# Patient Record
Sex: Female | Born: 1940 | Race: White | Hispanic: No | Smoking: Never smoker
Health system: Southern US, Community
[De-identification: ages and names within clinical notes are randomized; demographics above are authoritative.]

## PROBLEM LIST (undated history)

## (undated) DIAGNOSIS — L508 Other urticaria: Secondary | ICD-10-CM

## (undated) DIAGNOSIS — E785 Hyperlipidemia, unspecified: Secondary | ICD-10-CM

## (undated) DIAGNOSIS — E039 Hypothyroidism, unspecified: Secondary | ICD-10-CM

## (undated) DIAGNOSIS — I5032 Chronic diastolic (congestive) heart failure: Secondary | ICD-10-CM

## (undated) DIAGNOSIS — K219 Gastro-esophageal reflux disease without esophagitis: Secondary | ICD-10-CM

## (undated) DIAGNOSIS — E663 Overweight: Secondary | ICD-10-CM

## (undated) DIAGNOSIS — K8020 Calculus of gallbladder without cholecystitis without obstruction: Secondary | ICD-10-CM

## (undated) DIAGNOSIS — I2510 Atherosclerotic heart disease of native coronary artery without angina pectoris: Secondary | ICD-10-CM

## (undated) DIAGNOSIS — I10 Essential (primary) hypertension: Secondary | ICD-10-CM

## (undated) HISTORY — DX: Hyperlipidemia, unspecified: E78.5

## (undated) HISTORY — DX: Hypothyroidism, unspecified: E03.9

## (undated) HISTORY — DX: Chronic diastolic (congestive) heart failure: I50.32

## (undated) HISTORY — DX: Essential (primary) hypertension: I10

## (undated) HISTORY — PX: TUBAL LIGATION: SHX77

## (undated) HISTORY — PX: CHOLECYSTECTOMY: SHX55

## (undated) HISTORY — DX: Other urticaria: L50.8

## (undated) HISTORY — DX: Atherosclerotic heart disease of native coronary artery without angina pectoris: I25.10

## (undated) HISTORY — DX: Overweight: E66.3

## (undated) HISTORY — DX: Gastro-esophageal reflux disease without esophagitis: K21.9

## (undated) HISTORY — DX: Calculus of gallbladder without cholecystitis without obstruction: K80.20

## (undated) NOTE — *Deleted (*Deleted)
Name: Vickie [name] MRN: [id number] DOB: December 07, 1940 61 y.o. PCP: Inez Catalina, MD  Date of Admission: 06/21/2020 10:17 PM Date of Discharge:  Attending Physician: Inez Catalina, MD  Discharge Diagnosis: 1. ***  Discharge Medications: Allergies as of 06/23/2020      Reactions   Atorvastatin Other (See Comments)   Elevated liver functions numbers   Rosuvastatin Other (See Comments)   Constipation      Medication List    TAKE these medications   aspirin 81 MG tablet Take 1 tablet (81 mg total) by mouth daily.   dexamethasone 6 MG tablet Commonly known as: DECADRON Take 1 tablet (6 mg total) by mouth daily for 3 days. Start taking on: June 24, 2020   ibuprofen 200 MG tablet Commonly known as: ADVIL Take 200-400 mg by mouth every 6 (six) hours as needed for mild pain (or discomfort).   isosorbide mononitrate 60 MG 24 hr tablet Commonly known as: IMDUR Take 1 tablet (60 mg total) by mouth daily.   levothyroxine 88 MCG tablet Commonly known as: SYNTHROID Take 1 tablet (88 mcg total) by mouth daily before breakfast.   lisinopril 10 MG tablet Commonly known as: ZESTRIL Take 1 tablet (10 mg total) by mouth daily.   nebivolol 10 MG tablet Commonly known as: Bystolic Take 1 tablet (10 mg total) by mouth daily. KE   nitroGLYCERIN 0.4 MG SL tablet Commonly known as: NITROSTAT Place 1 tablet (0.4 mg total) under the tongue every 5 (five) minutes as needed for chest pain.   Praluent 75 MG/ML Soaj Generic drug: Alirocumab Inject 75 mg into the skin every 14 (fourteen) days.            Durable Medical Equipment  (From admission, onward)         Start     Ordered   06/23/20 0000  For home use only DME oxygen       Question Answer Comment  Length of Need 6 Months   Mode or (Route) Nasal cannula   Liters per Minute 3   Frequency Continuous (stationary and portable oxygen unit needed)   Oxygen conserving device Yes   Oxygen delivery system Gas       06/23/20 1657           Discharge Care Instructions  (From admission, onward)         Start     Ordered   06/23/20 0000  Discharge wound care:       Comments: For your pressure injury on your right buttocks, continue frequent position changes to prevent further pressure injury to this area.   06/23/20 1657          Disposition and follow-up:   Vickie [name] was discharged from [facility] in {DISCHARGE CONDITION:19696} condition.  At the hospital follow up visit please address:  1.  ***  2.  Labs / imaging needed at time of follow-up: ***  3.  Pending labs/ test needing follow-up: ***  Follow-up Appointments:   Hospital Course by problem list: 1. ***  Discharge Vitals:   BP 134/63 (BP Location: Right Arm)   Pulse 72   Temp 97.8 F (36.6 C) (Oral)   Resp 19   Ht 5' (1.524 m)   Wt 67 kg   LMP 10/20/1991   SpO2 94%   BMI 28.85 kg/m   Pertinent Labs, Studies, and Procedures:  ***  Discharge Instructions: Discharge Instructions    Call MD for:  difficulty breathing, headache or visual disturbances   Complete by: As directed    Call MD for:  persistant dizziness or light-headedness   Complete by: As directed    Call MD for:  temperature >100.4   Complete by: As directed    Discharge wound care:   Complete by: As directed    For your pressure injury on your right buttocks, continue frequent position changes to prevent further pressure injury to this area.   For home use only DME oxygen   Complete by: As directed    Length of Need: 6 Months   Mode or (Route): Nasal cannula   Liters per Minute: 3   Frequency: Continuous (stationary and portable oxygen unit needed)   Oxygen conserving device: Yes   Oxygen delivery system: Gas   Increase activity slowly   Complete by: As directed       Signed: Alphonzo Severance, MD 06/23/2020, 4:59 PM   Pager: [phone]

---

## 2000-01-19 ENCOUNTER — Encounter: Payer: Self-pay | Admitting: Emergency Medicine

## 2000-01-19 ENCOUNTER — Emergency Department (HOSPITAL_COMMUNITY): Admission: EM | Admit: 2000-01-19 | Discharge: 2000-01-19 | Payer: Self-pay | Admitting: Emergency Medicine

## 2000-01-29 ENCOUNTER — Emergency Department (HOSPITAL_COMMUNITY): Admission: EM | Admit: 2000-01-29 | Discharge: 2000-01-29 | Payer: Self-pay | Admitting: Emergency Medicine

## 2000-02-01 ENCOUNTER — Encounter: Admission: RE | Admit: 2000-02-01 | Discharge: 2000-02-01 | Payer: Self-pay | Admitting: *Deleted

## 2000-03-12 ENCOUNTER — Encounter: Admission: RE | Admit: 2000-03-12 | Discharge: 2000-03-12 | Payer: Self-pay | Admitting: Hematology and Oncology

## 2000-06-25 ENCOUNTER — Encounter: Admission: RE | Admit: 2000-06-25 | Discharge: 2000-06-25 | Payer: Self-pay | Admitting: Internal Medicine

## 2000-08-22 ENCOUNTER — Encounter: Admission: RE | Admit: 2000-08-22 | Discharge: 2000-08-22 | Payer: Self-pay | Admitting: Internal Medicine

## 2001-09-19 ENCOUNTER — Encounter: Admission: RE | Admit: 2001-09-19 | Discharge: 2001-09-19 | Payer: Self-pay

## 2001-10-28 ENCOUNTER — Encounter: Admission: RE | Admit: 2001-10-28 | Discharge: 2001-10-28 | Payer: Self-pay | Admitting: Internal Medicine

## 2002-11-30 ENCOUNTER — Encounter: Admission: RE | Admit: 2002-11-30 | Discharge: 2002-11-30 | Payer: Self-pay | Admitting: Internal Medicine

## 2004-01-04 ENCOUNTER — Encounter: Admission: RE | Admit: 2004-01-04 | Discharge: 2004-01-04 | Payer: Self-pay | Admitting: Internal Medicine

## 2005-03-23 ENCOUNTER — Ambulatory Visit: Payer: Self-pay | Admitting: Internal Medicine

## 2006-04-01 ENCOUNTER — Ambulatory Visit: Payer: Self-pay | Admitting: Internal Medicine

## 2006-08-14 DIAGNOSIS — I10 Essential (primary) hypertension: Secondary | ICD-10-CM

## 2006-08-14 DIAGNOSIS — D649 Anemia, unspecified: Secondary | ICD-10-CM | POA: Insufficient documentation

## 2006-08-14 DIAGNOSIS — E039 Hypothyroidism, unspecified: Secondary | ICD-10-CM | POA: Insufficient documentation

## 2006-08-14 HISTORY — DX: Hypothyroidism, unspecified: E03.9

## 2006-08-14 HISTORY — DX: Essential (primary) hypertension: I10

## 2007-05-30 ENCOUNTER — Ambulatory Visit: Payer: Self-pay | Admitting: Hospitalist

## 2007-05-30 ENCOUNTER — Encounter (INDEPENDENT_AMBULATORY_CARE_PROVIDER_SITE_OTHER): Payer: Self-pay | Admitting: Internal Medicine

## 2007-06-02 LAB — CONVERTED CEMR LAB
ALT: 27 units/L (ref 0–35)
AST: 22 units/L (ref 0–37)
Albumin: 4.5 g/dL (ref 3.5–5.2)
Alkaline Phosphatase: 70 units/L (ref 39–117)
BUN: 16 mg/dL (ref 6–23)
Basophils Absolute: 0.1 10*3/uL (ref 0.0–0.1)
Basophils Relative: 1 % (ref 0–1)
CO2: 24 meq/L (ref 19–32)
Calcium: 9 mg/dL (ref 8.4–10.5)
Chloride: 106 meq/L (ref 96–112)
Cholesterol: 205 mg/dL — ABNORMAL HIGH (ref 0–200)
Creatinine, Ser: 0.95 mg/dL (ref 0.40–1.20)
Eosinophils Absolute: 0.3 10*3/uL (ref 0.0–0.7)
Eosinophils Relative: 3 % (ref 0–5)
Glucose, Bld: 94 mg/dL (ref 70–99)
HCT: 45 % (ref 36.0–46.0)
HDL: 34 mg/dL — ABNORMAL LOW (ref >39)
Hemoglobin: 14.6 g/dL (ref 12.0–15.0)
LDL Cholesterol: 132 mg/dL — ABNORMAL HIGH (ref 0–99)
Lymphocytes Relative: 45 % (ref 12–46)
Lymphs Abs: 4.1 10*3/uL — ABNORMAL HIGH (ref 0.7–3.3)
MCHC: 32.4 g/dL (ref 30.0–36.0)
MCV: 93.8 fL (ref 78.0–100.0)
Monocytes Absolute: 0.5 10*3/uL (ref 0.2–0.7)
Monocytes Relative: 6 % (ref 3–11)
Neutro Abs: 4.1 10*3/uL (ref 1.7–7.7)
Neutrophils Relative %: 46 % (ref 43–77)
Platelets: 361 10*3/uL (ref 150–400)
Potassium: 4.4 meq/L (ref 3.5–5.3)
RBC: 4.8 M/uL (ref 3.87–5.11)
RDW: 13.3 % (ref 11.5–14.0)
Sodium: 142 meq/L (ref 135–145)
TSH: 2.704 microintl units/mL (ref 0.350–5.50)
Total Bilirubin: 0.3 mg/dL (ref 0.3–1.2)
Total CHOL/HDL Ratio: 6
Total Protein: 7.4 g/dL (ref 6.0–8.3)
Triglycerides: 196 mg/dL — ABNORMAL HIGH (ref <150)
VLDL: 39 mg/dL (ref 0–40)
WBC: 9 10*3/uL (ref 4.0–10.5)

## 2007-06-12 ENCOUNTER — Encounter (INDEPENDENT_AMBULATORY_CARE_PROVIDER_SITE_OTHER): Payer: Self-pay | Admitting: Internal Medicine

## 2007-06-12 ENCOUNTER — Ambulatory Visit: Payer: Self-pay | Admitting: Hospitalist

## 2007-06-12 LAB — CONVERTED CEMR LAB
BUN: 23 mg/dL (ref 6–23)
CO2: 25 meq/L (ref 19–32)
Calcium: 9.7 mg/dL (ref 8.4–10.5)
Chloride: 102 meq/L (ref 96–112)
Creatinine, Ser: 1 mg/dL (ref 0.40–1.20)
Glucose, Bld: 99 mg/dL (ref 70–99)
Potassium: 4.3 meq/L (ref 3.5–5.3)
Sodium: 141 meq/L (ref 135–145)

## 2008-01-29 ENCOUNTER — Telehealth (INDEPENDENT_AMBULATORY_CARE_PROVIDER_SITE_OTHER): Payer: Self-pay | Admitting: Internal Medicine

## 2008-03-11 ENCOUNTER — Ambulatory Visit: Payer: Self-pay | Admitting: Infectious Diseases

## 2008-03-11 ENCOUNTER — Encounter (INDEPENDENT_AMBULATORY_CARE_PROVIDER_SITE_OTHER): Payer: Self-pay | Admitting: Internal Medicine

## 2008-03-12 LAB — CONVERTED CEMR LAB
ALT: 21 units/L (ref 0–35)
AST: 24 units/L (ref 0–37)
Albumin: 4.7 g/dL (ref 3.5–5.2)
Alkaline Phosphatase: 65 units/L (ref 39–117)
BUN: 17 mg/dL (ref 6–23)
Basophils Absolute: 0.1 10*3/uL (ref 0.0–0.1)
Basophils Relative: 1 % (ref 0–1)
CO2: 22 meq/L (ref 19–32)
Calcium: 9.8 mg/dL (ref 8.4–10.5)
Chloride: 102 meq/L (ref 96–112)
Cholesterol: 184 mg/dL (ref 0–200)
Creatinine, Ser: 0.75 mg/dL (ref 0.40–1.20)
Eosinophils Absolute: 0.4 10*3/uL (ref 0.0–0.7)
Eosinophils Relative: 5 % (ref 0–5)
Free T4: 1.45 ng/dL (ref 0.89–1.80)
Glucose, Bld: 92 mg/dL (ref 70–99)
HCT: 42.2 % (ref 36.0–46.0)
HDL: 35 mg/dL — ABNORMAL LOW (ref >39)
Hemoglobin: 14 g/dL (ref 12.0–15.0)
LDL Cholesterol: 114 mg/dL — ABNORMAL HIGH (ref 0–99)
Lymphocytes Relative: 43 % (ref 12–46)
Lymphs Abs: 3.7 10*3/uL (ref 0.7–4.0)
MCHC: 33.2 g/dL (ref 30.0–36.0)
MCV: 91.5 fL (ref 78.0–100.0)
Monocytes Absolute: 0.5 10*3/uL (ref 0.1–1.0)
Monocytes Relative: 6 % (ref 3–12)
Neutro Abs: 4 10*3/uL (ref 1.7–7.7)
Neutrophils Relative %: 46 % (ref 43–77)
Platelets: 352 10*3/uL (ref 150–400)
Potassium: 4.5 meq/L (ref 3.5–5.3)
RBC: 4.61 M/uL (ref 3.87–5.11)
RDW: 13.2 % (ref 11.5–15.5)
Sodium: 136 meq/L (ref 135–145)
TSH: 0.681 microintl units/mL (ref 0.350–4.50)
Total Bilirubin: 0.5 mg/dL (ref 0.3–1.2)
Total CHOL/HDL Ratio: 5.3
Total Protein: 7.6 g/dL (ref 6.0–8.3)
Triglycerides: 174 mg/dL — ABNORMAL HIGH (ref <150)
VLDL: 35 mg/dL (ref 0–40)
WBC: 8.8 10*3/uL (ref 4.0–10.5)

## 2008-03-18 ENCOUNTER — Telehealth (INDEPENDENT_AMBULATORY_CARE_PROVIDER_SITE_OTHER): Payer: Self-pay | Admitting: Internal Medicine

## 2008-11-01 ENCOUNTER — Ambulatory Visit: Payer: Self-pay | Admitting: Internal Medicine

## 2008-11-01 LAB — CONVERTED CEMR LAB
BUN: 19 mg/dL (ref 6–23)
CO2: 22 meq/L (ref 19–32)
Calcium: 9.2 mg/dL (ref 8.4–10.5)
Chloride: 101 meq/L (ref 96–112)
Cholesterol: 203 mg/dL — ABNORMAL HIGH (ref 0–200)
Creatinine, Ser: 1 mg/dL (ref 0.40–1.20)
Glucose, Bld: 92 mg/dL (ref 70–99)
HDL: 36 mg/dL — ABNORMAL LOW (ref >39)
LDL Cholesterol: 120 mg/dL — ABNORMAL HIGH (ref 0–99)
Potassium: 4.5 meq/L (ref 3.5–5.3)
Sodium: 137 meq/L (ref 135–145)
TSH: 1.613 microintl units/mL (ref 0.350–4.500)
Total CHOL/HDL Ratio: 5.6
Triglycerides: 234 mg/dL — ABNORMAL HIGH (ref <150)
VLDL: 47 mg/dL — ABNORMAL HIGH (ref 0–40)

## 2009-05-31 ENCOUNTER — Telehealth: Payer: Self-pay | Admitting: Internal Medicine

## 2010-01-20 ENCOUNTER — Telehealth: Payer: Self-pay | Admitting: Internal Medicine

## 2010-02-15 ENCOUNTER — Ambulatory Visit: Payer: Self-pay | Admitting: Internal Medicine

## 2010-02-15 LAB — HM MAMMOGRAPHY

## 2010-02-15 LAB — HM PAP SMEAR

## 2010-09-20 ENCOUNTER — Ambulatory Visit: Admission: RE | Admit: 2010-09-20 | Discharge: 2010-09-20 | Payer: Self-pay | Source: Home / Self Care

## 2010-09-20 ENCOUNTER — Encounter: Payer: Self-pay | Admitting: Internal Medicine

## 2010-09-20 LAB — CONVERTED CEMR LAB
ALT: 19 units/L (ref 0–35)
AST: 17 units/L (ref 0–37)
Albumin: 4.6 g/dL (ref 3.5–5.2)
Alkaline Phosphatase: 70 units/L (ref 39–117)
BUN: 10 mg/dL (ref 6–23)
Basophils Absolute: 0.1 10*3/uL (ref 0.0–0.1)
Basophils Relative: 1 % (ref 0–1)
CO2: 25 meq/L (ref 19–32)
Calcium: 9.5 mg/dL (ref 8.4–10.5)
Chloride: 104 meq/L (ref 96–112)
Creatinine, Ser: 0.85 mg/dL (ref 0.40–1.20)
Eosinophils Absolute: 0.1 10*3/uL (ref 0.0–0.7)
Eosinophils Relative: 2 % (ref 0–5)
Glucose, Bld: 95 mg/dL (ref 70–99)
HCT: 45.9 % (ref 36.0–46.0)
Hemoglobin: 14.7 g/dL (ref 12.0–15.0)
Lymphocytes Relative: 42 % (ref 12–46)
Lymphs Abs: 3.4 10*3/uL (ref 0.7–4.0)
MCHC: 32 g/dL (ref 30.0–36.0)
MCV: 92.7 fL (ref 78.0–100.0)
Monocytes Absolute: 0.5 10*3/uL (ref 0.1–1.0)
Monocytes Relative: 7 % (ref 3–12)
Neutro Abs: 3.9 10*3/uL (ref 1.7–7.7)
Neutrophils Relative %: 49 % (ref 43–77)
Platelets: 357 10*3/uL (ref 150–400)
Potassium: 4.6 meq/L (ref 3.5–5.3)
RBC: 4.95 M/uL (ref 3.87–5.11)
RDW: 13.2 % (ref 11.5–15.5)
Sodium: 140 meq/L (ref 135–145)
TSH: 1.688 microintl units/mL (ref 0.350–4.50)
Total Bilirubin: 0.4 mg/dL (ref 0.3–1.2)
Total Protein: 7.2 g/dL (ref 6.0–8.3)
WBC: 7.9 10*3/uL (ref 4.0–10.5)

## 2010-09-26 NOTE — Progress Notes (Signed)
Summary: med refill/gp  Phone Note Refill Request Message from:  Patient on Jan 20, 2010 11:08 AM  Refills Requested: Medication #1:  SYNTHROID 125 MCG TABS Take 1 tablet by mouth once a day  Medication #2:  LISINOPRIL-HYDROCHLOROTHIAZIDE 20-25 MG  TABS Take 1 tablet by mouth once a day  Medication #3:  ATENOLOL 100 MG TABS Take 1 tablet by mouth once a day  Medication #4:  ASPIRIN 81 MG  TBEC Take 1 tablet by mouth once a day. Pt. is completely out;  she has an appt. June 22,2011.     Method Requested: Electronic Initial call taken by: Chinita Pester RN,  Jan 20, 2010 11:08 AM  Follow-up for Phone Call        completed refill, thank you Pratik Dalziel  Follow-up by: Mliss Sax MD,  Jan 20, 2010 1:40 PM    Prescriptions: ASPIRIN 81 MG  TBEC (ASPIRIN) Take 1 tablet by mouth once a day  #30 x 5   Entered and Authorized by:   Mliss Sax MD   Signed by:   Mliss Sax MD on 01/20/2010   Method used:   Electronically to        [facility]* (retail)       [address] [place], [place]  [address]       Ph: [phone]       Fax: [phone]   RxID:   [id number] ATENOLOL 100 MG TABS (ATENOLOL) Take 1 tablet by mouth once a day  #30 x 5   Entered and Authorized by:   Mliss Sax MD   Signed by:   Mliss Sax MD on 01/20/2010   Method used:   Electronically to        [facility]* (retail)       [address] [place], [place]  [address]       Ph: [phone]       Fax: [phone]   RxID:   [id number] LISINOPRIL-HYDROCHLOROTHIAZIDE 20-25 MG  TABS (LISINOPRIL-HYDROCHLOROTHIAZIDE) Take 1 tablet by mouth once a day  #30 x 5   Entered and Authorized by:   Mliss Sax MD   Signed by:   Mliss Sax MD on 01/20/2010   Method used:   Electronically to        [facility]* (retail)       [address] [place], [place]  [address]       Ph: [phone]       Fax:  [phone]   RxID:   [id number] SYNTHROID 125 MCG TABS (LEVOTHYROXINE SODIUM) Take 1 tablet by mouth once a day  #30 x 5   Entered and Authorized by:   Mliss Sax MD   Signed by:   Mliss Sax MD on 01/20/2010   Method used:   Electronically to        [facility]* (retail)       [address] [place], [place]  [address]       Ph: [phone]       Fax: [phone]   RxID:   [id number]   Appended Document: med refill/gp Pt was called and made awared Rx refills.

## 2010-09-26 NOTE — Assessment & Plan Note (Signed)
Summary: EST-CK/FU/MEDS/CFB   Vital Signs:  Patient profile:   70 year old female Height:      61 inches (154.94 cm) Weight:      164.8 pounds (74.91 kg) BMI:     31.25 Temp:     98.4 degrees F (36.89 degrees C) oral Pulse rate:   86 / minute BP sitting:   130 / 84  (right arm)  Vitals Entered By: Stanton Kidney Ditzler RN (February 15, 2010 2:51 PM) Is Patient Diabetic? No Pain Assessment Patient in pain? no      Nutritional Status BMI of > 30 = obese Nutritional Status Detail appetite good  Have you ever been in a relationship where you felt threatened, hurt or afraid?denies   Does patient need assistance? Functional Status Self care Ambulation Normal Comments Refills on meds.   History of Present Illness: 70 yo female with PMH outlined below presents to [facility] for regular follow up appointment. She has no concerns at the time. No recent sicknesses or hospitalizaitons. No episodes of chest pain, SOB, palpitations. No specific abdominal or urinary concerns. No recent changes in appetite, weight, sleep patterns, mood.    Depression History:      The patient denies a depressed mood most of the day and a diminished interest in her usual daily activities.  The patient denies significant weight loss, significant weight gain, insomnia, hypersomnia, psychomotor agitation, psychomotor retardation, fatigue (loss of energy), feelings of worthlessness (guilt), impaired concentration (indecisiveness), and recurrent thoughts of death or suicide.        The patient denies that she feels like life is not worth living, denies that she wishes that she were dead, and denies that she has thought about ending her life.         Preventive Screening-Counseling & Management  Alcohol-Tobacco     Alcohol type: occasional     Smoking Status: never  Caffeine-Diet-Exercise     Does Patient Exercise: yes     Type of exercise: yard work  Problems Prior to Update: 1)  Hypothyroidism  (ICD-244.9) 2)   Hypertension  (ICD-401.9) 3)  Anemia-nos  (ICD-285.9)  Medications Prior to Update: 1)  Synthroid 125 Mcg Tabs (Levothyroxine Sodium) .... Take 1 Tablet By Mouth Once A Day 2)  Lisinopril-Hydrochlorothiazide 20-25 Mg  Tabs (Lisinopril-Hydrochlorothiazide) .... Take 1 Tablet By Mouth Once A Day 3)  Atenolol 100 Mg Tabs (Atenolol) .... Take 1 Tablet By Mouth Once A Day 4)  Aspirin 81 Mg  Tbec (Aspirin) .... Take 1 Tablet By Mouth Once A Day  Current Medications (verified): 1)  Synthroid 125 Mcg Tabs (Levothyroxine Sodium) .... Take 1 Tablet By Mouth Once A Day 2)  Lisinopril-Hydrochlorothiazide 20-25 Mg  Tabs (Lisinopril-Hydrochlorothiazide) .... Take 1 Tablet By Mouth Once A Day 3)  Atenolol 100 Mg Tabs (Atenolol) .... Take 1 Tablet By Mouth Once A Day 4)  Aspirin 81 Mg  Tbec (Aspirin) .... Take 1 Tablet By Mouth Once A Day  Allergies (verified): No Known Drug Allergies  Past History:  Past Medical History: Last updated: 11/01/2008 Anemia-NOS, hx of when a teenager Hypertension Hypothyroidism Refusal of cancer screening (no mammogram, pap smear, or colonoscopy)  Past Surgical History: Last updated: 03/11/2008 Cholecystectomy Tubal ligation  Family History: Last updated: 03/11/2008 M 80 unknown medical problems, hx of lymphoma F died in war 4 Sibs 1 sis with lung CA Children 3 sons A&W, 1 with bladder CA  Social History: Last updated: 11/01/2008 Retired Divorced Never Smoked Drug use-no Regular exercise-yes-  works in the garden/yard Occasional ETOH, very infrequently (less than once a month)  Risk Factors: Exercise: yes (02/15/2010)  Risk Factors: Smoking Status: never (02/15/2010)  Family History: Reviewed history from 03/11/2008 and no changes required. M 80 unknown medical problems, hx of lymphoma F died in war 4 Sibs 1 sis with lung CA Children 3 sons A&W, 1 with bladder CA  Social History: Reviewed history from 11/01/2008 and no changes  required. Retired Divorced Never Smoked Drug use-no Regular exercise-yes- works in the garden/yard Occasional ETOH, very infrequently (less than once a month)  Review of Systems       per HPI  Physical Exam  General:  Well-developed,well-nourished,in no acute distress; alert,appropriate and cooperative throughout examination Lungs:  normal respiratory effort, no intercostal retractions, no accessory muscle use, normal breath sounds, no crackles, and no wheezes.   Heart:  normal rate, regular rhythm, no murmur, no gallop, and no rub.   Abdomen:  Bowel sounds positive,abdomen soft and non-tender without masses, organomegaly or hernias noted. Neurologic:  alert & oriented X3.   Psych:  Appropriate to somewhat withdrawn.   Impression & Recommendations:  Problem # 1:  HYPERTENSION (ICD-401.9) Assessment Unchanged Pt tells me that she does not like taking atenolol and she is ready to quit taking it. We have discussed in detail what is the possible outcome of this and I was willing to offer another substitute medicine and she is refusing. This is apparnetly a chronic issue with her and I am willing to compromise. I have discussed with her diet changes and exercises that she will have to follow if we are to stop atenolol. We can see how she does and will see her back on 3 months and see if lisinopril-HCTZ is adequate in BP control.  The following medications were removed from the medication list:    Atenolol 100 Mg Tabs (Atenolol) .Marland Kitchen... Take 1 tablet by mouth once a day Her updated medication list for this problem includes:    Lisinopril-hydrochlorothiazide 20-25 Mg Tabs (Lisinopril-hydrochlorothiazide) .Marland Kitchen... Take 1 tablet by mouth once a day  BP today: 130/84 Prior BP: 143/80 (11/01/2008)  Labs Reviewed: K+: 4.5 (11/01/2008) Creat: : 1.00 (11/01/2008)   Chol: 203 (11/01/2008)   HDL: 36 (11/01/2008)   LDL: 120 (11/01/2008)   TG: 234 (11/01/2008)  Problem # 2:  HYPOTHYROIDISM  (ICD-244.9) Will reassess TSH on her next visit and can probably readjust the medicine as indicated.  Her updated medication list for this problem includes:    Synthroid 125 Mcg Tabs (Levothyroxine sodium) .Marland Kitchen... Take 1 tablet by mouth once a day  Labs Reviewed: TSH: 1.613 (11/01/2008)    Chol: 203 (11/01/2008)   HDL: 36 (11/01/2008)   LDL: 120 (11/01/2008)   TG: 234 (11/01/2008)  Complete Medication List: 1)  Synthroid 125 Mcg Tabs (Levothyroxine sodium) .... Take 1 tablet by mouth once a day 2)  Lisinopril-hydrochlorothiazide 20-25 Mg Tabs (Lisinopril-hydrochlorothiazide) .... Take 1 tablet by mouth once a day 3)  Aspirin 81 Mg Tbec (Aspirin) .... Take 1 tablet by mouth once a day  Patient Instructions: 1)  Please schedule a follow-up appointment in 3 months. 2)  Please check your blood pressure regularly, if it is >170 please call clinic at [phone]   Prevention & Chronic Care Immunizations   Influenza vaccine: Not documented   Influenza vaccine deferral: Not indicated  (02/15/2010)    Tetanus booster: Not documented   Td booster deferral: Not indicated  (02/15/2010)    Pneumococcal vaccine: Not documented  Pneumococcal vaccine deferral: Not indicated  (02/15/2010)    H. zoster vaccine: Not documented   H. zoster vaccine deferral: Not indicated  (02/15/2010)  Colorectal Screening   Hemoccult: Not documented   Hemoccult action/deferral: Not indicated  (02/15/2010)    Colonoscopy: Not documented   Colonoscopy action/deferral: Not indicated  (02/15/2010)  Other Screening   Pap smear: Not documented   Pap smear action/deferral: Refused  (02/15/2010)    Mammogram: Not documented   Mammogram action/deferral: Refused  (02/15/2010)    DXA bone density scan: Not documented   DXA bone density action/deferral: Refused  (02/15/2010)  Reports requested:   Last mammogram report requested.  Smoking status: never  (02/15/2010)  Lipids   Total Cholesterol: 203   (11/01/2008)   LDL: 120  (11/01/2008)   LDL Direct: Not documented   HDL: 36  (11/01/2008)   Triglycerides: 234  (11/01/2008)  Hypertension   Last Blood Pressure: 130 / 84  (02/15/2010)   Serum creatinine: 1.00  (11/01/2008)   Serum potassium 4.5  (11/01/2008)    Hypertension flowsheet reviewed?: Yes   Progress toward BP goal: At goal  Self-Management Support :   Personal Goals (by the next clinic visit) :      Personal blood pressure goal: 140/90  (02/15/2010)   Patient will work on the following items until the next clinic visit to reach self-care goals:     Medications and monitoring: take my medicines every day, bring all of my medications to every visit  (02/15/2010)     Eating: drink diet soda or water instead of juice or soda, eat foods that are low in salt, eat baked foods instead of fried foods, eat fruit for snacks and desserts  (02/15/2010)    Hypertension self-management support: Written self-care plan, Education handout, Resources for patients handout  (02/15/2010)   Hypertension self-care plan printed.   Hypertension education handout printed      Resource handout printed.   Nursing Instructions: Request report of last mammogram

## 2010-09-28 NOTE — Assessment & Plan Note (Signed)
Summary: NEED MEDICATION/SB.   Vital Signs:  Patient profile:   70 year old female Height:      61 inches (154.94 cm) Weight:      154.2 pounds (74.91 kg) BMI:     31.25 Temp:     97.7 degrees F (36.50 degrees C) oral Pulse rate:   881 / minute BP sitting:   171 / 88  (right arm) Cuff size:   regular  Vitals Entered By: Theotis Barrio NT II (September 20, 2010 9:39 AM) CC: ROUTINE OFFICE VISIT FOR MEDICATION REFILL /  Is Patient Diabetic? No Pain Assessment Patient in pain? no      Nutritional Status BMI of > 30 = obese  Have you ever been in a relationship where you felt threatened, hurt or afraid?No   Does patient need assistance? Functional Status Self care Ambulation Normal   CC:  ROUTINE OFFICE VISIT FOR MEDICATION REFILL / .  History of Present Illness: Pt is a 70 y/o F with PMH outlined in the EMR who presents for routine visit and med refill.  Last seen in clinic in 2010.  HTN: pt tolerating lisinopril-hctz well but reports less than 100% compliance in the past. She has tired to improve this over the past month and is taking her pill every day.  Hypothyroid: doing well with synthroid.  She denies hair loss, unintentional weight loss, weight gain, increased anxiety or other compliance.    Preventive Screening-Counseling & Management  Alcohol-Tobacco     Alcohol type: occasional     Smoking Status: never  Caffeine-Diet-Exercise     Does Patient Exercise: yes     Type of exercise: yard work  Current Medications (verified): 1)  Synthroid 125 Mcg Tabs (Levothyroxine Sodium) .... Take 1 Tablet By Mouth Once A Day 2)  Lisinopril-Hydrochlorothiazide 20-25 Mg  Tabs (Lisinopril-Hydrochlorothiazide) .... Take 1 Tablet By Mouth Once A Day 3)  Aspirin 81 Mg  Tbec (Aspirin) .... Take 1 Tablet By Mouth Once A Day  Allergies (verified): No Known Drug Allergies  Past History:  Past medical, surgical, family and social histories (including risk factors) reviewed for  relevance to current acute and chronic problems.  Past Medical History: Reviewed history from 11/01/2008 and no changes required. Anemia-NOS, hx of when a teenager Hypertension Hypothyroidism Refusal of cancer screening (no mammogram, pap smear, or colonoscopy)  Past Surgical History: Reviewed history from 03/11/2008 and no changes required. Cholecystectomy Tubal ligation  Family History: Reviewed history from 03/11/2008 and no changes required. M 80 unknown medical problems, hx of lymphoma F died in war 4 Sibs 1 sis with lung CA Children 3 sons A&W, 1 with bladder CA  Social History: Reviewed history from 11/01/2008 and no changes required. Retired Divorced Never Smoked Drug use-no Regular exercise-yes- works in the garden/yard Occasional ETOH, very infrequently (less than once a month)  Physical Exam  General:  Well-developed,well-nourished,in no acute distress; alert,appropriate and cooperative throughout examination Head:  normocephalic and atraumatic.   Eyes:  vision grossly intact.  PERRL. EOMI.  anicteric Mouth:  pharynx pink and moist.   Neck:  supple, full ROM, and no masses.   Lungs:  normal respiratory effort, no intercostal retractions, no accessory muscle use, normal breath sounds, no crackles, and no wheezes.   Heart:  normal rate, regular rhythm, no murmur, no gallop, and no rub.   Extremities:  no clubbing, cyanosis, or edema Neurologic:  alert & oriented X3.  cranial nerves II-XII intact, strength normal in all extremities, sensation intact  to light touch, sensation intact to pinprick, and gait normal.   Skin:  turgor normal, color normal, no rashes, no suspicious lesions, and no edema.   Cervical Nodes:  no anterior cervical adenopathy and no posterior cervical adenopathy.   Psych:  Oriented X3, memory intact for recent and remote, normally interactive, good eye contact, and not anxious appearing.     Impression & Recommendations:  Problem # 1:   HYPERTENSION (ICD-401.9) Pts BP is significantly elevated above goal today and unchanged on manual recheck.   This may due to her poor compliance with her medication.  She is very resistant to the addition of a new medication at this time as well as an increase in her lisinopril that would result in the need to take 2 pills instead of the combo pill.  DIscussed the risks of elevated BP at length includind debiliating and possible fatal effects from stroke, etc.  Pt expressed understanding and is willing to return in 19mo for BP recheck and consider change to regimen at that time.  Encourage pt to aim forf 100% compliance with her anti-HTN meds.  WIll check CMET today.  Provided pt with informational handout from [employer] regarding HTN.  Advised to to try to avoid foods high in salt.  Her updated medication list for this problem includes:    Lisinopril-hydrochlorothiazide 20-25 Mg Tabs (Lisinopril-hydrochlorothiazide) .Marland Kitchen... Take 1 tablet by mouth once a day  Orders: T-CMP with Estimated GFR ([id number])  BP today: 171/88 Prior BP: 130/84 (02/15/2010)  Labs Reviewed: K+: 4.5 (11/01/2008) Creat: : 1.00 (11/01/2008)   Chol: 203 (11/01/2008)   HDL: 36 (11/01/2008)   LDL: 120 (11/01/2008)   TG: 234 (11/01/2008)  Problem # 2:  HYPOTHYROIDISM (ICD-244.9)  Pt is doing well without signs symptoms of thyroid dysfunction.  WIll check TSH today. Her updated medication list for this problem includes:    Synthroid 125 Mcg Tabs (Levothyroxine sodium) .Marland Kitchen... Take 1 tablet by mouth once a day  Orders: T-TSH ([id number])  Labs Reviewed: TSH: 1.613 (11/01/2008)    Chol: 203 (11/01/2008)   HDL: 36 (11/01/2008)   LDL: 120 (11/01/2008)   TG: 234 (11/01/2008)  Problem # 3:  ANEMIA-NOS (ICD-285.9) Pt without hx of symptoms of blood loss.  WIll check CBC today.  Orders: T-CBC w/Diff ([id number])  Problem # 4:  Preventive Health Care (ICD-V70.0) Pt refuses immunizations, colonoscopy/hemoccult cards,  mammogram, and pap smear.    >30 min spent reviewing previous records with 50% face to face time spent couseling pt about her chronic medical conditions.  Complete Medication List: 1)  Synthroid 125 Mcg Tabs (Levothyroxine sodium) .... Take 1 tablet by mouth once a day 2)  Lisinopril-hydrochlorothiazide 20-25 Mg Tabs (Lisinopril-hydrochlorothiazide) .... Take 1 tablet by mouth once a day 3)  Aspirin 81 Mg Tbec (Aspirin) .... Take 1 tablet by mouth once a day  Patient Instructions: 1)  Please schedule a follow-up appointment in approx 1 month with Dr. Aldine Contes or Dr. Arvilla Market to check your blood pressure. 2)  Keep taking all of your medicines as directed. 3)  I will call you if your lab work is abnormal. 4)  Your blood pressure was very high today at 171/88, your goal blood pressure is 120-130/88. 5)  Try to avoid foods high in salt to improve your blood pressure. Prescriptions: LISINOPRIL-HYDROCHLOROTHIAZIDE 20-25 MG  TABS (LISINOPRIL-HYDROCHLOROTHIAZIDE) Take 1 tablet by mouth once a day  #30 x 3   Entered and Authorized by:   Nelda Bucks DO   Signed by:  Nelda Bucks DO on 09/20/2010   Method used:   Electronically to        [facility]* (retail)       [address] [place], [place]  [address]       Ph: [phone]       Fax: [phone]   RxID:   [id number] SYNTHROID 125 MCG TABS (LEVOTHYROXINE SODIUM) Take 1 tablet by mouth once a day  #30 x 3   Entered and Authorized by:   Nelda Bucks DO   Signed by:   Nelda Bucks DO on 09/20/2010   Method used:   Electronically to        [facility]* (retail)       [address] [place], [place]  [address]       Ph: [phone]       Fax: [phone]   RxID:   [id number]    Orders Added: 1)  T-CMP with Estimated GFR [[id number]] 2)  T-TSH [[id number]] 3)  T-CBC w/Diff [[id number]] 4)  Est. Patient Level IV [31517]    Prevention & Chronic  Care Immunizations   Influenza vaccine: Not documented   Influenza vaccine deferral: Refused  (09/20/2010)    Tetanus booster: Not documented   Td booster deferral: Not indicated  (02/15/2010)    Pneumococcal vaccine: Not documented   Pneumococcal vaccine deferral: Refused  (09/20/2010)    H. zoster vaccine: Not documented   H. zoster vaccine deferral: Not indicated  (02/15/2010)  Colorectal Screening   Hemoccult: Not documented   Hemoccult action/deferral: Not indicated  (02/15/2010)    Colonoscopy: Not documented   Colonoscopy action/deferral: Not indicated  (02/15/2010)  Other Screening   Pap smear: Not documented   Pap smear action/deferral: Refused  (02/15/2010)    Mammogram: Not documented   Mammogram action/deferral: Refused  (02/15/2010)    DXA bone density scan: Not documented   DXA bone density action/deferral: Refused  (02/15/2010)   Smoking status: never  (09/20/2010)  Lipids   Total Cholesterol: 203  (11/01/2008)   LDL: 120  (11/01/2008)   LDL Direct: Not documented   HDL: 36  (11/01/2008)   Triglycerides: 234  (11/01/2008)  Hypertension   Last Blood Pressure: 171 / 88  (09/20/2010)   Serum creatinine: 1.00  (11/01/2008)   Serum potassium 4.5  (11/01/2008)  Self-Management Support :   Personal Goals (by the next clinic visit) :      Personal blood pressure goal: 140/90  (02/15/2010)   Patient will work on the following items until the next clinic visit to reach self-care goals:     Medications and monitoring: take my medicines every day, bring all of my medications to every visit  (09/20/2010)     Eating: drink diet soda or water instead of juice or soda, eat more vegetables, eat foods that are low in salt, eat baked foods instead of fried foods, limit or avoid alcohol  (09/20/2010)     Activity: take a 30 minute walk every day  (09/20/2010)    Hypertension self-management support: Resources for patients handout  (09/20/2010)      Resource handout  printed.  Process Orders Check Orders Results:     [facility]: ABN not required for this insurance Tests Sent for requisitioning (September 21, 2010 11:08 AM):     09/20/2010: [facility] -- T-CMP with Estimated GFR [[id number]] (signed)     09/20/2010: [facility] -- T-TSH [phone] (signed)     09/20/2010: [facility] -- [facility] w/Diff [[id number]] (signed)

## 2010-10-15 ENCOUNTER — Encounter: Payer: Self-pay | Admitting: Internal Medicine

## 2010-10-19 ENCOUNTER — Encounter: Payer: Self-pay | Admitting: Internal Medicine

## 2010-10-19 ENCOUNTER — Ambulatory Visit (INDEPENDENT_AMBULATORY_CARE_PROVIDER_SITE_OTHER): Payer: Medicare Other | Admitting: Internal Medicine

## 2010-10-19 VITALS — BP 137/76 | HR 91 | Temp 97.0°F | Ht 60.0 in | Wt 151.3 lb

## 2010-10-19 DIAGNOSIS — E039 Hypothyroidism, unspecified: Secondary | ICD-10-CM

## 2010-10-19 DIAGNOSIS — I10 Essential (primary) hypertension: Secondary | ICD-10-CM

## 2010-10-19 NOTE — Progress Notes (Signed)
  Subjective:    Patient ID: Vickie [name], female    DOB: 1940/09/12, 70 y.o.   MRN: [id number]  HPI  patient is 70 year old female past medical history outlined below who presents to clinic for regular follow up on her blood pressure. She has been checking her blood pressure at home regularly and reports that numbers are usually within normal limits.  She reports compliance with her blood pressure medication. She also reports compliance with recommended diet. She denies any concerns at the time. No recent sicknesses or hospitalizations. No episodes of chest pain, shortness of breath, abdominal or urinary concerns.   Review of Systems  Constitutional: Negative.   HENT: Negative.   Eyes: Negative.   Respiratory: Negative.   Cardiovascular: Negative.   Gastrointestinal: Negative.   Genitourinary: Negative.   Musculoskeletal: Negative.   Hematological: Negative.   Psychiatric/Behavioral: Negative.        Objective:   Physical Exam  [nursing notereviewed. Constitutional: She appears well-developed and well-nourished. No distress.  HENT:  Head: Normocephalic and atraumatic.  Right Ear: External ear normal.  Left Ear: External ear normal.  Nose: Nose normal.  Mouth/Throat: Oropharynx is clear and moist. No oropharyngeal exudate.  Eyes: Conjunctivae and EOM are normal. Pupils are equal, round, and reactive to light. Left eye exhibits no discharge. No scleral icterus.  Neck: Normal range of motion. Neck supple. No JVD present. No tracheal deviation present. No thyromegaly present.  Cardiovascular: Normal rate, regular rhythm, normal heart sounds and intact distal pulses.  Exam reveals no gallop and no friction rub.   No murmur heard. Pulmonary/Chest: Effort normal and breath sounds normal. No respiratory distress. She has no wheezes. She has no rales. She exhibits no tenderness.  Abdominal: Soft. Bowel sounds are normal. She exhibits no distension and no mass. There is no tenderness.  There is no rebound and no guarding.  Lymphadenopathy:    She has no cervical adenopathy.  Skin: Skin is warm and dry. No rash noted. She is not diaphoretic. No erythema. No pallor.  Psychiatric: She has a normal mood and affect. Her behavior is normal. Judgment and thought content normal.          Assessment & Plan:

## 2010-10-19 NOTE — Assessment & Plan Note (Signed)
TSH was recently checked and was within normal limits, will continue same medication regimen.

## 2010-10-19 NOTE — Assessment & Plan Note (Signed)
Blood pressure is at goal. Continue same medication regimen. Electrolytes were checked on last visit one month ago. Results were within normal limits.

## 2010-10-19 NOTE — Patient Instructions (Signed)
Please schedule follow up appointment in 3 months

## 2011-02-23 ENCOUNTER — Other Ambulatory Visit: Payer: Self-pay | Admitting: Internal Medicine

## 2011-03-30 ENCOUNTER — Other Ambulatory Visit: Payer: Self-pay | Admitting: Internal Medicine

## 2011-06-22 ENCOUNTER — Other Ambulatory Visit: Payer: Self-pay | Admitting: Internal Medicine

## 2011-06-23 NOTE — Telephone Encounter (Signed)
Please have the pt make a follow up appointment to be seen in the clinic in December or Jan

## 2011-08-02 ENCOUNTER — Ambulatory Visit (INDEPENDENT_AMBULATORY_CARE_PROVIDER_SITE_OTHER): Payer: Medicare Other | Admitting: Internal Medicine

## 2011-08-02 ENCOUNTER — Encounter: Payer: Self-pay | Admitting: Internal Medicine

## 2011-08-02 DIAGNOSIS — E039 Hypothyroidism, unspecified: Secondary | ICD-10-CM

## 2011-08-02 DIAGNOSIS — I10 Essential (primary) hypertension: Secondary | ICD-10-CM

## 2011-08-02 LAB — BASIC METABOLIC PANEL WITH GFR
Chloride: 101 mEq/L (ref 96–112)
Creat: 1 mg/dL (ref 0.50–1.10)
GFR, Est Non African American: 58 mL/min — ABNORMAL LOW

## 2011-08-02 LAB — TSH: TSH: 0.156 u[IU]/mL — ABNORMAL LOW (ref 0.350–4.500)

## 2011-08-02 MED ORDER — LEVOTHYROXINE SODIUM 125 MCG PO TABS: 125.0000 ug | ORAL_TABLET | Freq: Every day | ORAL | Status: DC

## 2011-08-02 MED ORDER — LISINOPRIL-HYDROCHLOROTHIAZIDE 20-25 MG PO TABS: 1.0000 | ORAL_TABLET | Freq: Every day | ORAL | Status: DC

## 2011-08-02 NOTE — Progress Notes (Signed)
  Subjective:   Patient ID: Vickie [name] female   DOB: Mar 21, 1941 70 y.o.   MRN: [id number]  HPI: [name] is a 70 y.o. womyroian with PMH of hypothyroidism and HTN.  She presents today for follow-up of her hypothyroidism and hypertension. She states that she has no symptoms of hypothyroidism. She has no tiredness, abdominal pain, problems with bowel or bladder function, or fatigue.   She does not check her blood pressure and does not know what it runs. She denies HA, chest pain, blurry vision. She repeatedly denied having any specific complaint and wished to get her medications refilled     Past Medical History  Diagnosis Date  . Hyperlipidemia   . Hypertension   . Hypothyroidism    Current Outpatient Prescriptions  Medication Sig Dispense Refill  . aspirin 81 MG tablet Take 81 mg by mouth daily.        Marland Kitchen levothyroxine (SYNTHROID, LEVOTHROID) 125 MCG tablet Take 1 tablet (125 mcg total) by mouth daily.  31 tablet  11  . lisinopril-hydrochlorothiazide (PRINZIDE,ZESTORETIC) 20-25 MG per tablet Take 1 tablet by mouth daily.  30 tablet  11  . DISCONTD: levothyroxine (SYNTHROID, LEVOTHROID) 125 MCG tablet TAKE ONE TABLET BY MOUTH EVERY DAY  31 tablet  11   Family History  Problem Relation Age of Onset  . Lung cancer Sister   . Lung cancer Brother    History   Social History  . Marital Status: Single    Spouse Name: N/A    Number of Children: N/A  . Years of Education: N/A   Social History Main Topics  . Smoking status: Never Smoker   . Smokeless tobacco: None  . Alcohol Use: No  . Drug Use: No  . Sexually Active:    Other Topics Concern  . None   Social History Narrative  . None   Review of Systems: Constitutional: Denies fever, chills, and fatigue.  Respiratory: Denies SOB, DOE, cough, Cardiovascular: Denies chest pain Gastrointestinal: Denies nausea, vomiting, abdominal pain, diarrhea, constipation, Neurological: Denies dizziness,  light-headedness,and headaches.    Objective:  Physical Exam: Filed Vitals:   08/02/11 1346  BP: 138/78  Pulse: 84  Temp: 98.1 F (36.7 C)  TempSrc: Oral  Height: 5' (1.524 m)  Weight: 139 lb 11.2 oz (63.368 kg)   Constitutional: Vital signs reviewed.  Patient is an elderly appearing woman in no acute distress.`She is is impatient and only marginally cooperative with exam.  Head: Normocephalic and atraumatic Eyes: EOMI, conjunctivae normal, No scleral icterus.  Neck: Trachea midline Cardiovascular: RRR, S1 normal, S2 normal, no MRG Pulmonary/Chest: CTAB, no wheezes, rales, or rhonchi Neurological: cranial nerve II-XII are grossly intact, no focal motor deficit, Psychiatric: Depressed mood and affect. speech and behavior is normal. Judgment and thought content normal. Cognition and memory are normal.   Assessment & Plan:

## 2011-08-02 NOTE — Assessment & Plan Note (Signed)
Blood pressure is well controlled.  I advised her to check her blood pressure when filling her medications. We will recheck her electrolytes today

## 2011-08-02 NOTE — Assessment & Plan Note (Signed)
Last few TSH checks have been normal and she denies symptoms of hyper or hypothyroidsm.  We will draw TSH again today and make adjustments as necessary.

## 2011-08-02 NOTE — Patient Instructions (Signed)
Go to your local pharmacy or the health department to get the shingles vaccine. It should only cost around $20.  If any of your lab results are abnormal, we will contact you by phone or send you a letter. If they are normal, we will not contact you, but will be happy to discuss them at your next clinic appointment.  Return to clinic to see your doctor in 12 months. Please bring all your medications to your next clinic appointment.       Hypertension As your heart beats, it forces blood through your arteries. This force is your blood pressure. If the pressure is too high, it is called hypertension (HTN) or high blood pressure. HTN is dangerous because you may have it and not know it. High blood pressure may mean that your heart has to work harder to pump blood. Your arteries may be narrow or stiff. The extra work puts you at risk for heart disease, stroke, and other problems.  Blood pressure consists of two numbers, a higher number over a lower, 110/72, for example. It is stated as "110 over 72." The ideal is below 120 for the top number (systolic) and under 80 for the bottom (diastolic). Write down your blood pressure today. You should pay close attention to your blood pressure if you have certain conditions such as:  Heart failure.   Prior heart attack.   Diabetes   Chronic kidney disease.   Prior stroke.   Multiple risk factors for heart disease.  To see if you have HTN, your blood pressure should be measured while you are seated with your arm held at the level of the heart. It should be measured at least twice. A one-time elevated blood pressure reading (especially in the Emergency Department) does not mean that you need treatment. There may be conditions in which the blood pressure is different between your right and left arms. It is important to see your caregiver soon for a recheck. Most people have essential hypertension which means that there is not a specific cause. This type  of high blood pressure may be lowered by changing lifestyle factors such as:  Stress.   Smoking.   Lack of exercise.   Excessive weight.   Drug/tobacco/alcohol use.   Eating less salt.  Most people do not have symptoms from high blood pressure until it has caused damage to the body. Effective treatment can often prevent, delay or reduce that damage. TREATMENT  When a cause has been identified, treatment for high blood pressure is directed at the cause. There are a large number of medications to treat HTN. These fall into several categories, and your caregiver will help you select the medicines that are best for you. Medications may have side effects. You should review side effects with your caregiver. If your blood pressure stays high after you have made lifestyle changes or started on medicines,   Your medication(s) may need to be changed.   Other problems may need to be addressed.   Be certain you understand your prescriptions, and know how and when to take your medicine.   Be sure to follow up with your caregiver within the time frame advised (usually within two weeks) to have your blood pressure rechecked and to review your medications.   If you are taking more than one medicine to lower your blood pressure, make sure you know how and at what times they should be taken. Taking two medicines at the same time can result in blood  pressure that is too low.  SEEK IMMEDIATE MEDICAL CARE IF:  You develop a severe headache, blurred or changing vision, or confusion.   You have unusual weakness or numbness, or a faint feeling.   You have severe chest or abdominal pain, vomiting, or breathing problems.  MAKE SURE YOU:   Understand these instructions.   Will watch your condition.   Will get help right away if you are not doing well or get worse.  Document Released: 08/13/2005 Document Revised: 04/25/2011 Document Reviewed: 04/02/2008 [facility] Patient Information 2012 [place],  [place].     Hypothyroidism The thyroid is a large gland located in the lower front of your neck. The thyroid gland helps control metabolism. Metabolism is how your body handles food. It controls metabolism with the hormone thyroxine. When this gland is underactive (hypothyroid), it produces too little hormone.  CAUSES These include:   Absence or destruction of thyroid tissue.   Goiter due to iodine deficiency.   Goiter due to medications.   Congenital defects (since birth).   Problems with the pituitary. This causes a lack of TSH (thyroid stimulating hormone). This hormone tells the thyroid to turn out more hormone.  SYMPTOMS  Lethargy (feeling as though you have no energy)   Cold intolerance   Weight gain (in spite of normal food intake)   Dry skin   Coarse hair   Menstrual irregularity (if severe, may lead to infertility)   Slowing of thought processes  Cardiac problems are also caused by insufficient amounts of thyroid hormone. Hypothyroidism in the newborn is cretinism, and is an extreme form. It is important that this form be treated adequately and immediately or it will lead rapidly to retarded physical and mental development. DIAGNOSIS  To prove hypothyroidism, your caregiver may do blood tests and ultrasound tests. Sometimes the signs are hidden. It may be necessary for your caregiver to watch this illness with blood tests either before or after diagnosis and treatment. TREATMENT  Low levels of thyroid hormone are increased by using synthetic thyroid hormone. This is a safe, effective treatment. It usually takes about four weeks to gain the full effects of the medication. After you have the full effect of the medication, it will generally take another four weeks for problems to leave. Your caregiver may start you on low doses. If you have had heart problems the dose may be gradually increased. It is generally not an emergency to get rapidly to normal. HOME CARE  INSTRUCTIONS   Take your medications as your caregiver suggests. Let your caregiver know of any medications you are taking or start taking. Your caregiver will help you with dosage schedules.   As your condition improves, your dosage needs may increase. It will be necessary to have continuing blood tests as suggested by your caregiver.   Report all suspected medication side effects to your caregiver.  SEEK MEDICAL CARE IF: Seek medical care if you develop:  Sweating.   Tremulousness (tremors).   Anxiety.   Rapid weight loss.   Heat intolerance.   Emotional swings.   Diarrhea.   Weakness.  SEEK IMMEDIATE MEDICAL CARE IF:  You develop chest pain, an irregular heart beat (palpitations), or a rapid heart beat. MAKE SURE YOU:   Understand these instructions.   Will watch your condition.   Will get help right away if you are not doing well or get worse.  Document Released: 08/13/2005 Document Revised: 04/25/2011 Document Reviewed: 04/02/2008 [facility] Patient Information 2012 [place], [place].

## 2011-08-03 NOTE — Progress Notes (Signed)
agree

## 2011-08-10 ENCOUNTER — Encounter (HOSPITAL_COMMUNITY): Payer: Self-pay | Admitting: Internal Medicine

## 2011-08-14 ENCOUNTER — Other Ambulatory Visit: Payer: Self-pay | Admitting: Internal Medicine

## 2011-08-14 DIAGNOSIS — E039 Hypothyroidism, unspecified: Secondary | ICD-10-CM

## 2011-08-14 MED ORDER — LEVOTHYROXINE SODIUM 112 MCG PO TABS: 112.0000 ug | ORAL_TABLET | Freq: Every day | ORAL | Status: DC

## 2011-10-01 ENCOUNTER — Other Ambulatory Visit: Payer: Medicare Other

## 2012-05-23 ENCOUNTER — Encounter: Payer: Self-pay | Admitting: Internal Medicine

## 2012-05-23 ENCOUNTER — Encounter: Payer: Self-pay | Admitting: *Deleted

## 2012-05-23 ENCOUNTER — Ambulatory Visit (INDEPENDENT_AMBULATORY_CARE_PROVIDER_SITE_OTHER): Payer: Medicare Other | Admitting: Internal Medicine

## 2012-05-23 VITALS — BP 121/72 | HR 89 | Temp 98.3°F | Ht 60.0 in | Wt 149.5 lb

## 2012-05-23 DIAGNOSIS — B86 Scabies: Secondary | ICD-10-CM

## 2012-05-23 DIAGNOSIS — I10 Essential (primary) hypertension: Secondary | ICD-10-CM

## 2012-05-23 DIAGNOSIS — E039 Hypothyroidism, unspecified: Secondary | ICD-10-CM

## 2012-05-23 MED ORDER — PERMETHRIN 5 % EX CREA: TOPICAL_CREAM | CUTANEOUS | Status: DC

## 2012-05-23 MED ORDER — LISINOPRIL-HYDROCHLOROTHIAZIDE 20-25 MG PO TABS: 1.0000 | ORAL_TABLET | Freq: Every day | ORAL | Status: DC

## 2012-05-23 MED ORDER — LEVOTHYROXINE SODIUM 112 MCG PO TABS: 112.0000 ug | ORAL_TABLET | Freq: Every day | ORAL | Status: DC

## 2012-05-23 MED ORDER — IVERMECTIN 3 MG PO TABS: 12.0000 mg | ORAL_TABLET | Freq: Once | ORAL | Status: DC

## 2012-05-23 NOTE — Assessment & Plan Note (Signed)
Long term complaints of itchiness diffuse, cannot pinpoint a date but thinks maybe years.  Last night woke up at 11pm with intense itching, welts all over body, that went away quickly.  She took a soda bath and then rubbed chamomile all over her body.  -permethrim cream 5% to be applied all over body -given literature and recommended to wash everything with hot water and complete treatment -ivermectin 12mg  po x1 -f/u 2 weeks

## 2012-05-23 NOTE — Progress Notes (Signed)
Subjective:   Patient ID: Vickie [name] female   DOB: 01-25-1941 71 y.o.   MRN: [id number]  HPI: Vickie [name] is a 71 y.o. female with PMH of hyperlipidemia, HTN, and hypothyroidism presenting to clinic today for an acute visit in regards to a rash and extreme itching episode last night.  Vickie [name] claims she woke up at 11pm at night with extreme itching and hives all over her body that soon spontaneously resolved.  She took a Regulatory affairs officer and then put chamomile lotion all over her body and went back to bed.  This morning, the itching is gone but she still has some red/pink marks on her extremities and chest and abdomen.  She claims she has been having more of these "episodes" frequently lately but cannot put to a timeline.  She does not know how long she has been having itching episodes but claims it may have been for at least over a year.  She denies any allergies, eating something new, and takes only her levothyroxine and prinzide and aspirin medications.  Yesterday, she ate pasta with sauce, a hamburger patty, and cranberry sauce out of a jar, but she has had all these items before.  She went to bed around 830pm after her dinner and then woke up at 11pm with itching.  She did not take any benadryl or anything else to relieve the itching, it just spontaneously resolved and the hives went down.  Otherwise, she has no major complaints at this time.  She denies N/V/D, headaches, chest pain, shortness of breath, abdominal pain, or urinary complaints at this time.  Of note, her son and his dog have moved in 4 months ago to her home and she sleeps on the bed with the dog at times. She was having itching prior to them moving in but more frequently lately.     Past Medical History  Diagnosis Date  . Hyperlipidemia   . Hypertension   . Hypothyroidism    Current Outpatient Prescriptions  Medication Sig Dispense Refill  . aspirin 81 MG tablet Take 81 mg by mouth daily.        Marland Kitchen  levothyroxine (SYNTHROID, LEVOTHROID) 112 MCG tablet Take 1 tablet (112 mcg total) by mouth daily.  30 tablet  5  . lisinopril-hydrochlorothiazide (PRINZIDE,ZESTORETIC) 20-25 MG per tablet Take 1 tablet by mouth daily.  30 tablet  11  . DISCONTD: levothyroxine (SYNTHROID, LEVOTHROID) 112 MCG tablet Take 1 tablet (112 mcg total) by mouth daily.  30 tablet  2  . DISCONTD: levothyroxine (SYNTHROID, LEVOTHROID) 112 MCG tablet Take 125 mcg by mouth daily.      Marland Kitchen DISCONTD: lisinopril-hydrochlorothiazide (PRINZIDE,ZESTORETIC) 20-25 MG per tablet Take 1 tablet by mouth daily.  30 tablet  11  . ivermectin (STROMECTOL) 3 MG TABS Take 4 tablets (12 mg total) by mouth once.  4 tablet  0  . permethrin (ACTICIN) 5 % cream apply daily for 7 days  60 g  0   Family History  Problem Relation Age of Onset  . Lung cancer Sister   . Lung cancer Brother    History   Social History  . Marital Status: Single    Spouse Name: N/A    Number of Children: N/A  . Years of Education: N/A   Social History Main Topics  . Smoking status: Never Smoker   . Smokeless tobacco: None  . Alcohol Use: No  . Drug Use: No  . Sexually Active: None   Other Topics  Concern  . None   Social History Narrative  . None   Review of Systems: Constitutional: Denies fever, chills, diaphoresis, appetite change and fatigue.  HEENT: Denies photophobia, eye pain, redness, hearing loss, ear pain, congestion, sore throat, rhinorrhea, sneezing, mouth sores, trouble swallowing, neck pain, neck stiffness and tinnitus.   Respiratory: Denies SOB, DOE, cough, chest tightness,  and wheezing.   Cardiovascular: Denies chest pain, palpitations and leg swelling.  Gastrointestinal: Denies nausea, vomiting, abdominal pain, diarrhea, constipation, blood in stool and abdominal distention.  Genitourinary: Denies dysuria, urgency, frequency, hematuria, flank pain and difficulty urinating.  Musculoskeletal: Denies myalgias, back pain, joint swelling,  arthralgias and gait problem.  Skin: Rash.  Denies pallor and wound.  Neurological: Denies dizziness, seizures, syncope, weakness, light-headedness, numbness and headaches.  Hematological: Denies adenopathy. Easy bruising, personal or family bleeding history  Psychiatric/Behavioral: Denies suicidal ideation, mood changes, confusion, nervousness, sleep disturbance and agitation  Objective:  Physical Exam: Filed Vitals:   05/23/12 1319  BP: 121/72  Pulse: 89  Temp: 98.3 F (36.8 C)  TempSrc: Oral  Height: 5' (1.524 m)  Weight: 149 lb 8 oz (67.813 kg)  SpO2: 98%   Constitutional: Vital signs reviewed.  Patient is a well-developed and well-nourished female in no acute distress and cooperative with exam. Alert and oriented x3.  Head: Normocephalic and atraumatic, dried skin and scratch marks on scalp.  Ear: TM normal bilaterally Mouth: no erythema or exudates, MMM Eyes: PERRLA, EOMI, conjunctivae normal, No scleral icterus.  Neck: Supple, Trachea midline normal ROM, No JVD, mass, thyromegaly, or carotid bruit present.  Cardiovascular: RRR, S1 normal, S2 normal, no MRG, pulses symmetric and intact bilaterally Pulmonary/Chest: CTAB, no wheezes, rales, or rhonchi, blotchy red/pink marks on b/l breasts. Abdominal: Soft. Non-tender, non-distended, bowel sounds are normal, no masses, organomegaly, or guarding present.  GU: no CVA tenderness Musculoskeletal: No joint deformities, erythema, or stiffness, ROM full and nontender Hematology: no cervical, inginal, or axillary adenopathy.  Neurological: A&O x3, Strength is normal and symmetric bilaterally, cranial nerve II-XII are grossly intact, no focal motor deficit, sensory intact to light touch bilaterally.  Skin: Warm, dry and intact, itchy. B/l hands covered with dried up chamomile lotion.  Linear burrows noted on b/l lower extremities, abdomen, red/pink spot marks over chest, abdomen, b/l arms, thighs, dried up blister scars on b/l lower  extremities, scratch marks on back, itchiness noted between webs of fingers.  Psychiatric: Normal mood and affect. speech and behavior is normal. Judgment and thought content normal. Cognition and memory are normal.   Assessment & Plan:  Discussed with Dr. Kem Kays  Itching/Rash--likely scabies--prescribed premethrin, ivermectin, and advised to wash all clothes and bedding. F/u 2 weeks.

## 2012-05-23 NOTE — Assessment & Plan Note (Signed)
On synthroid

## 2012-05-23 NOTE — Progress Notes (Unsigned)
Pt walked into clinic with c/o getting up last night with itching over body.  Whelps appeared, at 0330 both feet became swelling. Today still has swelling to feet but itching and whelps are gone.  She feels shaky today. Wants to be seen.  Will see today at 1:45

## 2012-05-23 NOTE — Patient Instructions (Signed)
Apply the cream all over your body head to toe for 7 days, follow instructions on tube   Take one dose of ivermectin 12mg  4 tablets at once today  Come back in 2 weeks for follow up  Wash all your bedding at home with hot water and clothes  Scabies Scabies are small bugs (mites) that burrow under the skin and cause red bumps and severe itching. These bugs can only be seen with a microscope. Scabies are highly contagious. They can spread easily from person to person by direct contact. They are also spread through sharing clothing or linens that have the scabies mites living in them. It is not unusual for an entire family to become infected through shared towels, clothing, or bedding.  HOME CARE INSTRUCTIONS   Your caregiver may prescribe a cream or lotion to kill the mites. If this cream is prescribed; massage the cream into the entire area of the body from the neck to the bottom of both feet. Also massage the cream into the scalp and face if your child is less than 54 year old. Avoid the eyes and mouth.   Leave the cream on for 8 to12 hours. Do not wash your hands after application. Your child should bathe or shower after the 8 to 12 hour application period. Sometimes it is helpful to apply the cream to your child at right before bedtime.   One treatment is usually effective and will eliminate approximately 95% of infestations. For severe cases, your caregiver may decide to repeat the treatment in 1 week. Everyone in your household should be treated with one application of the cream.   New rashes or burrows should not appear after successful treatment within 24 to 48 hours; however the itching and rash may last for 2 to 4 weeks after successful treatment. If your symptoms persist longer than this, see your caregiver.   Your caregiver also may prescribe a medication to help with the itching or to help the rash go away more quickly.   Scabies can live on clothing or linens for up to 3 days. Your  entire child's recently used clothing, towels, stuffed toys, and bed linens should be washed in hot water and then dried in a dryer for at least 20 minutes on high heat. Items that cannot be washed should be enclosed in a plastic bag for at least 3 days.   To help relieve itching, bathe your child in a cool bath or apply cool washcloths to the affected areas.   Your child may return to school after treatment with the prescribed cream.  SEEK MEDICAL CARE IF:   The itching persists longer than 4 weeks after treatment.   The rash spreads or becomes infected (the area has red blisters or yellow-tan crust).  Document Released: 08/13/2005 Document Revised: 08/02/2011 Document Reviewed: 12/22/2008 [facility] Patient Information 2012 [place], [place].

## 2012-05-23 NOTE — Assessment & Plan Note (Addendum)
Stable. bp in clinic today [phone]  On prinzide 20-25mg 

## 2012-05-26 NOTE — Progress Notes (Signed)
INTERNAL MEDICINE TEACHING ATTENDING ADDENDUM - Jonah Blue, DO: I personally saw and evaluated Vickie [name] in this clinic visit in conjunction with the resident, Dr. Virgina Organ. I have discussed patient's plan of care with medical resident during this visit. I have confirmed the physical exam findings and have read and agree with the clinic note including the plan.

## 2012-06-06 ENCOUNTER — Ambulatory Visit: Payer: Medicare Other | Admitting: Internal Medicine

## 2013-01-01 ENCOUNTER — Encounter: Payer: Medicare Other | Admitting: Internal Medicine

## 2013-01-01 ENCOUNTER — Encounter: Payer: Self-pay | Admitting: Internal Medicine

## 2013-01-02 ENCOUNTER — Encounter: Payer: Medicare Other | Admitting: Internal Medicine

## 2013-02-23 ENCOUNTER — Other Ambulatory Visit: Payer: Self-pay | Admitting: Internal Medicine

## 2013-02-23 DIAGNOSIS — E039 Hypothyroidism, unspecified: Secondary | ICD-10-CM

## 2013-02-24 NOTE — Telephone Encounter (Signed)
She has not had a TSH checked since 2012 and has no showed multiple appointments.  I will provide her with a three month prescription but will not prescribe again until she is seen in the clinic and has a TSH checked.  Over and under replacement are not in her best interests from a health standpoint and this needs to be monitored closely.  She needs to make the next appointment scheduled with me to get further refills.  Please inform her of this and make an appointment  In any non-overbook openings with me in late August or September.  Thank You.

## 2013-03-16 ENCOUNTER — Encounter: Payer: Self-pay | Admitting: Internal Medicine

## 2013-03-16 ENCOUNTER — Ambulatory Visit (INDEPENDENT_AMBULATORY_CARE_PROVIDER_SITE_OTHER): Payer: Medicare Other | Admitting: Internal Medicine

## 2013-03-16 VITALS — BP 138/80 | HR 93 | Temp 98.2°F | Ht 60.0 in | Wt 155.5 lb

## 2013-03-16 DIAGNOSIS — L508 Other urticaria: Secondary | ICD-10-CM

## 2013-03-16 DIAGNOSIS — L309 Dermatitis, unspecified: Secondary | ICD-10-CM

## 2013-03-16 DIAGNOSIS — L259 Unspecified contact dermatitis, unspecified cause: Secondary | ICD-10-CM

## 2013-03-16 DIAGNOSIS — E785 Hyperlipidemia, unspecified: Secondary | ICD-10-CM | POA: Diagnosis not present

## 2013-03-16 DIAGNOSIS — E039 Hypothyroidism, unspecified: Secondary | ICD-10-CM

## 2013-03-16 DIAGNOSIS — E079 Disorder of thyroid, unspecified: Secondary | ICD-10-CM

## 2013-03-16 DIAGNOSIS — I10 Essential (primary) hypertension: Secondary | ICD-10-CM

## 2013-03-16 HISTORY — DX: Other urticaria: L50.8

## 2013-03-16 LAB — BASIC METABOLIC PANEL WITH GFR
BUN: 13 mg/dL (ref 6–23)
CO2: 26 mEq/L (ref 19–32)
Chloride: 100 mEq/L (ref 96–112)
Creat: 0.79 mg/dL (ref 0.50–1.10)
Glucose, Bld: 101 mg/dL — ABNORMAL HIGH (ref 70–99)
Potassium: 4.4 mEq/L (ref 3.5–5.3)
Sodium: 136 mEq/L (ref 135–145)

## 2013-03-16 LAB — TSH: TSH: 1.737 u[IU]/mL (ref 0.350–4.500)

## 2013-03-16 LAB — LIPID PANEL
Cholesterol: 212 mg/dL — ABNORMAL HIGH (ref 0–200)
HDL: 36 mg/dL — ABNORMAL LOW (ref >39)
Total CHOL/HDL Ratio: 5.9 Ratio
VLDL: 34 mg/dL (ref 0–40)

## 2013-03-16 MED ORDER — LEVOTHYROXINE SODIUM 112 MCG PO TABS: 112.0000 ug | ORAL_TABLET | Freq: Every day | ORAL | Status: DC

## 2013-03-16 MED ORDER — LISINOPRIL-HYDROCHLOROTHIAZIDE 20-25 MG PO TABS: 1.0000 | ORAL_TABLET | Freq: Every day | ORAL | Status: DC

## 2013-03-16 NOTE — Assessment & Plan Note (Signed)
Changes to abdomen look eczematous or contact not like scabies Advised patient Benadryl if itching  Counseled on mild skin care hygiene and flea collar for the dog

## 2013-03-16 NOTE — Progress Notes (Signed)
  Subjective:    Patient ID: Vickie [name], female    DOB: 04/16/1941, 72 y.o.   MRN: [id number]  HPI Comments: 72 y.o. Hypothyroidism, HTN (BP 138/80) presents for f/u and med Rx refill of Lisinopril-HCTZ, Synthyroid.  She states her skin is still itching left abdomen below left breast but the area it healing.  She does not know what type of soap she uses and does not use lotion.  Her soon does have a dog which she watches daily and was letting sleep in the bed with her.  They recently bought a flea color for the dog which she states after putting the flea collar on her hand was read.  She states that when she goes to the grocery store her hands get red after touching produce.    Health Maintenance: She declines a mammogram or pap smear today   SH: lives alone, retired from [employer] after 14 years.  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       Review of Systems  Respiratory: Negative for shortness of breath.   Cardiovascular: Negative for chest pain.  Gastrointestinal: Negative for blood in stool.  Genitourinary: Negative for hematuria.       Objective:   Physical Exam  Nursing note and vitals reviewed. Constitutional: She is oriented to person, place, and time. Vital signs are normal. She appears well-developed and well-nourished. She is cooperative. No distress.  HENT:  Head: Normocephalic and atraumatic.  Mouth/Throat: Oropharynx is clear and moist and mucous membranes are normal. No oropharyngeal exudate.  Eyes: Conjunctivae are normal. Pupils are equal, round, and reactive to light. Right eye exhibits no discharge. Left eye exhibits no discharge. No scleral icterus.  Cardiovascular: Normal rate, regular rhythm, S1 normal, S2 normal and normal heart sounds.   No murmur heard. Pulmonary/Chest: Effort normal and breath sounds normal. No respiratory distress. She has no wheezes.    Abdominal: Soft. Bowel sounds are normal. There is no tenderness.  Obese ab  Neurological: She is alert and oriented to person, place, and time. Gait normal.  Skin: Skin is warm and dry. No rash noted. She is not diaphoretic.     Psychiatric: She has a normal mood and affect. Her speech is normal and behavior is normal. Judgment and thought content normal. Cognition and memory are normal.          Assessment & Plan:  F/u 6 months. BMP, lipid and tsh today

## 2013-03-16 NOTE — Assessment & Plan Note (Addendum)
tsh low 0.156 last time was checked.  At one point patient was taking 125 mcg then reduced to 112 mcg.  At this time patient is intermittently compliant with medications and does not know which strength she recently started taking.  Encouraged compliance Repeat tsh today  Rx refill of Synthyroid 112 mcg qd x 6 month supply  F/u 6 months

## 2013-03-16 NOTE — Patient Instructions (Addendum)
General Instructions: Try Benadryl for itching if rash returns  Use mild soaps (i.e dove), laundry detergents, make sure pets have medications for fleas Return in 6 months  Pick up meds from pharmacy and take as instructed.   Pleasure to meet you.   Treatment Goals:  Goals (1 Years of Data) as of 03/16/13   None      Progress Toward Treatment Goals:  Treatment Goal 03/16/2013  Blood pressure at goal    Self Care Goals & Plans:  Self Care Goal 03/16/2013  Manage my medications take my medicines as prescribed; bring my medications to every visit; refill my medications on time  Monitor my health keep track of my blood pressure  Eat healthy foods drink diet soda or water instead of juice or soda; eat more vegetables; eat foods that are low in salt; eat baked foods instead of fried foods; eat fruit for snacks and desserts; eat smaller portions  Be physically active find an activity I enjoy  Meeting treatment goals maintain the current self-care plan       Care Management & Community Referrals:  Referral 03/16/2013  Referrals made for care management support none needed  Referrals made to community resources none       Hypertension As your heart beats, it forces blood through your arteries. This force is your blood pressure. If the pressure is too high, it is called hypertension (HTN) or high blood pressure. HTN is dangerous because you may have it and not know it. High blood pressure may mean that your heart has to work harder to pump blood. Your arteries may be narrow or stiff. The extra work puts you at risk for heart disease, stroke, and other problems.  Blood pressure consists of two numbers, a higher number over a lower, 110/72, for example. It is stated as "110 over 72." The ideal is below 120 for the top number (systolic) and under 80 for the bottom (diastolic). Write down your blood pressure today. You should pay close attention to your blood pressure if you have certain  conditions such as:  Heart failure.  Prior heart attack.  Diabetes  Chronic kidney disease.  Prior stroke.  Multiple risk factors for heart disease. To see if you have HTN, your blood pressure should be measured while you are seated with your arm held at the level of the heart. It should be measured at least twice. A one-time elevated blood pressure reading (especially in the Emergency Department) does not mean that you need treatment. There may be conditions in which the blood pressure is different between your right and left arms. It is important to see your caregiver soon for a recheck. Most people have essential hypertension which means that there is not a specific cause. This type of high blood pressure may be lowered by changing lifestyle factors such as:  Stress.  Smoking.  Lack of exercise.  Excessive weight.  Drug/tobacco/alcohol use.  Eating less salt. Most people do not have symptoms from high blood pressure until it has caused damage to the body. Effective treatment can often prevent, delay or reduce that damage. TREATMENT  When a cause has been identified, treatment for high blood pressure is directed at the cause. There are a large number of medications to treat HTN. These fall into several categories, and your caregiver will help you select the medicines that are best for you. Medications may have side effects. You should review side effects with your caregiver. If your blood pressure stays  high after you have made lifestyle changes or started on medicines,   Your medication(s) may need to be changed.  Other problems may need to be addressed.  Be certain you understand your prescriptions, and know how and when to take your medicine.  Be sure to follow up with your caregiver within the time frame advised (usually within two weeks) to have your blood pressure rechecked and to review your medications.  If you are taking more than one medicine to lower your blood  pressure, make sure you know how and at what times they should be taken. Taking two medicines at the same time can result in blood pressure that is too low. SEEK IMMEDIATE MEDICAL CARE IF:  You develop a severe headache, blurred or changing vision, or confusion.  You have unusual weakness or numbness, or a faint feeling.  You have severe chest or abdominal pain, vomiting, or breathing problems. MAKE SURE YOU:   Understand these instructions.  Will watch your condition.  Will get help right away if you are not doing well or get worse. Document Released: 08/13/2005 Document Revised: 11/05/2011 Document Reviewed: 04/02/2008 [facility] Patient Information 2014 [place], [place].  Thyroid Diseases Your thyroid is a butterfly-shaped gland in your neck. It is located just above your collarbone. It is one of your endocrine glands, which make hormones. The thyroid helps set your metabolism. Metabolism is how your body gets energy from the foods you eat.  Millions of people have thyroid diseases. Women experience thyroid problems more often than men. In fact, overactive thyroid problems (hyperthyroidism) occur in 1% of all women. If you have a thyroid disease, your body may use energy more slowly or quickly than it should.  Thyroid problems also include an immune disease where your body reacts against your thyroid gland (called thyroiditis). A different problem involves lumps and bumps (called nodules) that develop in the gland. The nodules are usually, but not always, noncancerous. THE MOST COMMON THYROID PROBLEMS AND CAUSES ARE DISCUSSED BELOW There are many causes for thyroid problems. Treatment depends upon the exact diagnosis and includes trying to reset your body's metabolism to a normal rate. Hyperthyroidism Too much thyroid hormone from an overactive thyroid gland is called hyperthyroidism. In hyperthyroidism, the body's metabolism speeds up. One of the most frequent forms of hyperthyroidism is  known as Graves' disease. Graves' disease tends to run in families. Although Graves' is thought to be caused by a problem with the immune system, the exact nature of the genetic problem is unknown. Hypothyroidism Too little thyroid hormone from an underactive thyroid gland is called hypothyroidism. In hypothyroidism, the body's metabolism is slowed. Several things can cause this condition. Most causes affect the thyroid gland directly and hurt its ability to make enough hormone.  Rarely, there may be a pituitary gland tumor (located near the base of the brain). The tumor can block the pituitary from producing thyroid-stimulating hormone (TSH). Your body makes TSH to stimulate the thyroid to work properly. If the pituitary does not make enough TSH, the thyroid fails to make enough hormones needed for good health. Whether the problem is caused by thyroid conditions or by the pituitary gland, the result is that the thyroid is not making enough hormones. Hypothyroidism causes many physical and mental processes to become sluggish. The body consumes less oxygen and produces less body heat. Thyroid Nodules A thyroid nodule is a small swelling or lump in the thyroid gland. They are common. These nodules represent either a growth of thyroid tissue  or a fluid-filled cyst. Both form a lump in the thyroid gland. Almost half of all people will have tiny thyroid nodules at some point in their lives. Typically, these are not noticeable until they become large and affect normal thyroid size. Larger nodules that are greater than a half inch across (about 1 centimeter) occur in about 5 percent of people. Although most nodules are not cancerous, people who have them should seek medical care to rule out cancer. Also, some thyroid nodules may produce too much thyroid hormone or become too large. Large nodules or a large gland can interfere with breathing or swallowing or may cause neck discomfort. Other problems Other thyroid  problems include cancer and thyroiditis. Thyroiditis is a malfunction of the body's immune system. Normally, the immune system works to defend the body against infection and other problems. When the immune system is not working properly, it may mistakenly attack normal cells, tissues, and organs. Examples of autoimmune diseases are Hashimoto's thyroiditis (which causes low thyroid function) and Graves' disease (which causes excess thyroid function). SYMPTOMS  Symptoms vary greatly depending upon the exact type of problem with the thyroid. Hyperthyroidism-is when your thyroid is too active and makes more thyroid hormone than your body needs. The most common cause is Graves' Disease. Too much thyroid hormone can cause some or all of the following symptoms:  Anxiety.  Irritability.  Difficulty sleeping.  Fatigue.  A rapid or irregular heartbeat.  A fine tremor of your hands or fingers.  An increase in perspiration.  Sensitivity to heat.  Weight loss, despite normal food intake.  Brittle hair.  Enlargement of your thyroid gland (goiter).  Light menstrual periods.  Frequent bowel movements. Graves' disease can specifically cause eye and skin problems. The skin problems involve reddening and swelling of the skin, often on your shins and on the top of your feet. Eye problems can include the following:  Excess tearing and sensation of grit or sand in either or both eyes.  Reddened or inflamed eyes.  Widening of the space between your eyelids.  Swelling of the lids and tissues around the eyes.  Light sensitivity.  Ulcers on the cornea.  Double vision.  Limited eye movements.  Blurred or reduced vision. Hypothyroidism- is when your thyroid gland is not active enough. This is more common than hyperthyroidism. Symptoms can vary a lot depending of the severity of the hormone deficiency. Symptoms may develop over a long period of time and can include several of the  following:  Fatigue.  Sluggishness.  Increased sensitivity to cold.  Constipation.  Pale, dry skin.  A puffy face.  Hoarse voice.  High blood cholesterol level.  Unexplained weight gain.  Muscle aches, tenderness and stiffness.  Pain, stiffness or swelling in your joints.  Muscle weakness.  Heavier than normal menstrual periods.  Brittle fingernails and hair.  Depression. Thyroid Nodules - most do not cause signs or symptoms. Occasionally, some may become so large that you can feel or even see the swelling at the base of your neck. You may realize a lump or swelling is there when you are shaving or putting on makeup. Men might become aware of a nodule when shirt collars suddenly feel too tight. Some nodules produce too much thyroid hormone. This can produce the same symptoms as hyperthyroidism (see above). Thyroid nodules are seldom cancerous. However, a nodule is more likely to be malignant (cancerous) if it:  Grows quickly or feels hard.  Causes you to become hoarse or to  have trouble swallowing or breathing.  Causes enlarged lymph nodes under your jaw or in your neck. DIAGNOSIS  Because there are so many possible thyroid conditions, your caregiver may ask for a number of tests. They will do this in order to narrow down the exact diagnosis. These tests can include:  Blood and antibody tests.  Special thyroid scans using small, safe amounts of radioactive iodine.  Ultrasound of the thyroid gland (particularly if there is a nodule or lump).  Biopsy. This is usually done with a special needle. A needle biopsy is a procedure to obtain a sample of cells from the thyroid. The tissue will be tested in a lab and examined under a microscope. TREATMENT  Treatment depends on the exact diagnosis. Hyperthyroidism  Beta-blockers help relieve many of the symptoms.  Anti-thyroid medications prevent the thyroid from making excess hormones.  Radioactive iodine treatment can  destroy overactive thyroid cells. The iodine can permanently decrease the amount of hormone produced.  Surgery to remove the thyroid gland.  Treatments for eye problems that come from Graves' disease also include medications and special eye surgery, if felt to be appropriate. Hypothyroidism Thyroid replacement with levothyroxine is the mainstay of treatment. Treatment with thyroid replacement is usually lifelong and will require monitoring and adjustment from time to time. Thyroid Nodules  Watchful waiting. If a small nodule causes no symptoms or signs of cancer on biopsy, then no treatment may be chosen at first. Re-exam and re-checking blood tests would be the recommended follow-up.  Anti-thyroid medications or radioactive iodine treatment may be recommended if the nodules produce too much thyroid hormone (see Treatment for Hyperthyroidism above).  Alcohol ablation. Injections of small amounts of ethyl alcohol (ethanol) can cause a non-cancerous nodule to shrink in size.  Surgery (see Treatment for Hyperthyroidism above). HOME CARE INSTRUCTIONS   Take medications as instructed.  Follow through on recommended testing. SEEK MEDICAL CARE IF:   You feel that you are developing symptoms of Hyperthyroidism or Hypothyroidism as described above.  You develop a new lump/nodule in the neck/thyroid area that you had not noticed before.  You feel that you are having side effects from medicines prescribed.  You develop trouble breathing or swallowing. SEEK IMMEDIATE MEDICAL CARE IF:   You develop a fever of 102 F (38.9 C) or higher.  You develop severe sweating.  You develop palpitations and/or rapid heart beat.  You develop shortness of breath.  You develop nausea and vomiting.  You develop extreme shakiness.  You develop agitation.  You develop lightheadedness or have a fainting episode. Document Released: 06/10/2007 Document Revised: 11/05/2011 Document Reviewed:  06/10/2007 [facility] Patient Information 2014 [place], [place].

## 2013-03-16 NOTE — Progress Notes (Signed)
Case discussed with Dr. McLean at the time of the visit.  We reviewed the resident's history and exam and pertinent patient test results.  I agree with the assessment, diagnosis, and plan of care documented in the resident's note.     

## 2013-03-16 NOTE — Assessment & Plan Note (Signed)
BP Readings from Last 3 Encounters:  03/16/13 138/80  05/23/12 121/72  08/02/11 138/78    Lab Results  Component Value Date   NA 140 08/02/2011   K 4.0 08/02/2011   CREATININE 1.00 08/02/2011    Assessment: Blood pressure control: controlled Progress toward BP goal:  at goal Comments: none  Plan: Medications:  continue current medications Educational resources provided: brochure Self management tools provided:   Other plans: Rx refill 6 month supply. Checked BMET and lipids today

## 2013-03-26 ENCOUNTER — Encounter: Payer: Self-pay | Admitting: Internal Medicine

## 2013-03-30 ENCOUNTER — Encounter: Payer: Self-pay | Admitting: Internal Medicine

## 2013-03-30 ENCOUNTER — Ambulatory Visit (INDEPENDENT_AMBULATORY_CARE_PROVIDER_SITE_OTHER): Payer: Medicare Other | Admitting: Internal Medicine

## 2013-03-30 VITALS — BP 143/83 | HR 120 | Temp 97.6°F | Resp 20 | Ht 60.0 in | Wt 153.8 lb

## 2013-03-30 DIAGNOSIS — L508 Other urticaria: Secondary | ICD-10-CM

## 2013-03-30 MED ORDER — LORATADINE 10 MG PO CAPS: 10.0000 mg | ORAL_CAPSULE | Freq: Every day | ORAL | Status: DC

## 2013-03-30 MED ORDER — RANITIDINE HCL 150 MG PO CAPS: 150.0000 mg | ORAL_CAPSULE | Freq: Two times a day (BID) | ORAL | Status: DC

## 2013-03-30 NOTE — Progress Notes (Signed)
Patient ID: Vickie [name], female   DOB: [birth year], 72 y.o.   MRN: [id number]  HPI: Vickie [name] is a 72 y.o. with past medical history of hypertension, hypothyroidism, and hyperlipidemia, presents to the clinic with persistent rash on her left upper quadrant/left chest and also her itching and pain of her hands following exposure to cold.  Patient reports that she has noted that over last fall, whenever she exposes her hands to cold water or cord involvement she develops itching and pain, mainly involving the fingertips. She also feels that her hands become stiff. The symptoms tend to improve by keeping her hands warm. She remembers some history of hives and wheals which are resolved without much treatment several months ago. She did an Therapist, art, which revealed that this could be cold urticaria.  In terms of her previous rash, she denies exposure to any irritants, and she does not believe she has any insect bites. She has not started using any new cosmetics, soaps, or any other kind of irritants. She reports that this has somehow improved with Benadryl cream. The rash is mainly located in the left upper quadrant and left lateral chest. It also somehow spreads to the back, but does not appear to extend progressively.    Past Medical History  Diagnosis Date  . Hyperlipidemia   . Hypertension   . Hypothyroidism    Current Outpatient Prescriptions  Medication Sig Dispense Refill  . aspirin 81 MG tablet Take 81 mg by mouth daily.        Marland Kitchen levothyroxine (SYNTHROID, LEVOTHROID) 112 MCG tablet Take 1 tablet (112 mcg total) by mouth daily before breakfast.  90 tablet  3  . lisinopril-hydrochlorothiazide (PRINZIDE,ZESTORETIC) 20-25 MG per tablet Take 1 tablet by mouth daily.  30 tablet  5  . Loratadine 10 MG CAPS Take 1 capsule (10 mg total) by mouth daily.  30 each  11  . ranitidine (ZANTAC) 150 MG capsule Take 1 capsule (150 mg total) by mouth 2 (two) times daily.  60 capsule  11    No current facility-administered medications for this visit.   Family History  Problem Relation Age of Onset  . Lung cancer Sister   . Lung cancer Brother    History   Social History  . Marital Status: Single    Spouse Name: N/A    Number of Children: N/A  . Years of Education: N/A   Social History Main Topics  . Smoking status: Never Smoker   . Smokeless tobacco: None  . Alcohol Use: No  . Drug Use: No  . Sexually Active: None   Other Topics Concern  . None   Social History Narrative  . None    Review of Systems: Constitutional: Denies fever, chills, diaphoresis, appetite change and fatigue.  Respiratory: Denies SOB, DOE, cough, chest tightness, and wheezing.  Cardiovascular: No chest pain, palpitations and leg swelling.  Gastrointestinal: No abdominal pain, nausea, vomiting, bloody stools Genitourinary: No dysuria, frequency, hematuria, or flank pain.  Musculoskeletal: No myalgias, back pain, joint swelling, arthralgias    Objective:  Physical Exam: Filed Vitals:   03/30/13 1432  BP: 143/83  Pulse: 120  Temp: 97.6 F (36.4 C)  TempSrc: Oral  Resp: 20  Height: 5' (1.524 m)  Weight: 153 lb 12.8 oz (69.763 kg)  SpO2: 97%   General: Well nourished. No acute distress.  Lungs: CTA bilaterally. Heart: RRR; no extra sounds or murmurs  Abdomen: Non-distended, normal BS, soft, nontender; no hepatosplenomegaly  Extremities:  No pedal edema. No joint swelling or tenderness.  Neurologic: Alert and oriented x3. No obvious neurologic deficits.  Assessment & Plan:  I have discussed my assessment and plan with my attending, Dr. Rogelia Boga as detailed under problem based charting. Dr Rogelia Boga personally interviewed and examine the patient.

## 2013-03-30 NOTE — Progress Notes (Signed)
I saw and evaluated the patient.  I personally confirmed the key portions of the history and exam documented by Dr. Zada Girt and I reviewed pertinent patient test results.  The assessment, diagnosis, and plan were formulated together and I agree with the documentation in the resident's note. The pt does likely have acute urticaria. She has classic dermatographia. Doesn't give a h/o atopic conditions though so hopefully this will be transient. Since troublesome to pt, tx with H1 and H2 blockers.

## 2013-03-30 NOTE — Patient Instructions (Addendum)
Has appt with Dr Josem Kaufmann Read the information about urticaria Call if questions Try the two meds to decrease the histamine

## 2013-03-30 NOTE — Assessment & Plan Note (Signed)
Dr. Rogelia Boga personally, interviewed the patient and did a physical exam, which revealed dermographism on the forearms bilaterally, which is very suggestive of urticaria. She recommended treatment with loratadine and ranitidine in combination. She was also given materials about a urticaria.

## 2013-04-01 ENCOUNTER — Other Ambulatory Visit: Payer: Self-pay

## 2013-04-21 ENCOUNTER — Encounter: Payer: Self-pay | Admitting: Internal Medicine

## 2013-05-13 ENCOUNTER — Other Ambulatory Visit: Payer: Self-pay | Admitting: *Deleted

## 2013-05-13 DIAGNOSIS — E039 Hypothyroidism, unspecified: Secondary | ICD-10-CM

## 2013-05-13 NOTE — Telephone Encounter (Signed)
Pt has refills.

## 2013-05-13 NOTE — Telephone Encounter (Signed)
Pt has scheduled appointment 11/7 

## 2013-07-02 ENCOUNTER — Other Ambulatory Visit: Payer: Self-pay

## 2013-07-03 ENCOUNTER — Encounter: Payer: Self-pay | Admitting: Internal Medicine

## 2013-07-03 ENCOUNTER — Ambulatory Visit (INDEPENDENT_AMBULATORY_CARE_PROVIDER_SITE_OTHER): Payer: Medicare Other | Admitting: Internal Medicine

## 2013-07-03 VITALS — BP 146/81 | HR 83 | Temp 97.3°F | Ht 60.0 in | Wt 156.4 lb

## 2013-07-03 DIAGNOSIS — L508 Other urticaria: Secondary | ICD-10-CM

## 2013-07-03 DIAGNOSIS — K219 Gastro-esophageal reflux disease without esophagitis: Secondary | ICD-10-CM

## 2013-07-03 DIAGNOSIS — E785 Hyperlipidemia, unspecified: Secondary | ICD-10-CM | POA: Diagnosis not present

## 2013-07-03 DIAGNOSIS — E039 Hypothyroidism, unspecified: Secondary | ICD-10-CM | POA: Diagnosis not present

## 2013-07-03 DIAGNOSIS — E663 Overweight: Secondary | ICD-10-CM | POA: Insufficient documentation

## 2013-07-03 DIAGNOSIS — I10 Essential (primary) hypertension: Secondary | ICD-10-CM | POA: Diagnosis not present

## 2013-07-03 DIAGNOSIS — Z0000 Encounter for general adult medical examination without abnormal findings: Secondary | ICD-10-CM | POA: Insufficient documentation

## 2013-07-03 HISTORY — DX: Hyperlipidemia, unspecified: E78.5

## 2013-07-03 HISTORY — DX: Gastro-esophageal reflux disease without esophagitis: K21.9

## 2013-07-03 HISTORY — DX: Encounter for general adult medical examination without abnormal findings: Z00.00

## 2013-07-03 HISTORY — DX: Overweight: E66.3

## 2013-07-03 MED ORDER — LEVOTHYROXINE SODIUM 112 MCG PO TABS: 112.0000 ug | ORAL_TABLET | Freq: Every day | ORAL | Status: DC

## 2013-07-03 MED ORDER — LISINOPRIL-HYDROCHLOROTHIAZIDE 20-25 MG PO TABS: 1.0000 | ORAL_TABLET | Freq: Every day | ORAL | Status: DC

## 2013-07-03 NOTE — Assessment & Plan Note (Signed)
She has had no further rashes or pruritus since her last visit. Examination of the skin is unremarkable. We will reassess for symptoms at the followup visit.

## 2013-07-03 NOTE — Progress Notes (Signed)
  Subjective:    Patient ID: Vickie [name], female    DOB: 1940/10/13, 72 y.o.   MRN: [id number]  HPI  Please see the A&P for the status of the pt's chronic medical problems.  Review of Systems  Cardiovascular: Negative for leg swelling.  Gastrointestinal: Negative for abdominal pain.  Musculoskeletal: Negative for arthralgias and myalgias.  Skin: Negative for color change, rash and wound.  Neurological: Negative for seizures.  Psychiatric/Behavioral: Negative for dysphoric mood. The patient is not nervous/anxious.       Objective:   Physical Exam  Nursing note and vitals reviewed. Constitutional: She is oriented to person, place, and time. She appears well-developed and well-nourished. No distress.  HENT:  Head: Normocephalic and atraumatic.  Eyes: Conjunctivae are normal. Right eye exhibits no discharge. Left eye exhibits no discharge. No scleral icterus.  Neck: Normal range of motion. Neck supple. No thyromegaly present.  Cardiovascular: Normal rate, regular rhythm and normal heart sounds.  Exam reveals no gallop and no friction rub.   No murmur heard. Pulmonary/Chest: Effort normal and breath sounds normal. No stridor. No respiratory distress. She has no wheezes. She has no rales.  Abdominal: Soft. Bowel sounds are normal. She exhibits no distension. There is no tenderness. There is no rebound and no guarding.  Musculoskeletal: Normal range of motion. She exhibits no edema and no tenderness.  Lymphadenopathy:    She has no cervical adenopathy.  Neurological: She is alert and oriented to person, place, and time. She exhibits normal muscle tone.  Skin: Skin is warm and dry. No rash noted. She is not diaphoretic. No erythema.  Psychiatric: She has a normal mood and affect. Her behavior is normal. Judgment and thought content normal.      Assessment & Plan:   Please see problem oriented charting.

## 2013-07-03 NOTE — Patient Instructions (Signed)
It was nice to meet you.  You seem to be doing well from a health standpoint.  1) Keep taking your medications as you are.  2) I will see you back in 1 year, sooner if necessary.

## 2013-07-03 NOTE — Assessment & Plan Note (Signed)
She is intermittently compliant with her Synthroid 112 mcg by mouth daily. Her most recent TSH was at target at 1.737 less than 4 months ago. Compliance was encouraged. We will repeat the TSH level to assure it remains at target at the next visit.

## 2013-07-03 NOTE — Assessment & Plan Note (Signed)
She's not interested in the flu vaccination, T dap, Zostavax, or DEXA scan. She has a history of not interested in screening and preventative therapy. We will readdress these issues at the followup visit.

## 2013-07-03 NOTE — Assessment & Plan Note (Signed)
She states she has occasional acid reflux symptoms but they are not significant enough to her to require pharmacologic therapy. We will reassess her symptoms at the followup visit.

## 2013-07-03 NOTE — Assessment & Plan Note (Signed)
Her most recent LDL was 142 four months ago. Her 10 year cardiovascular risk using the Framingham calculator is 14%. Given this her goal LDL is less than 130. She's not interested in starting antilipid therapy at this time. We will repeat the lipid panel at the followup visit and if the LDL is above target encouraged her to reconsider statin therapy.

## 2013-07-03 NOTE — Assessment & Plan Note (Signed)
Her blood pressure today was 146/81. This is slightly above target on the lisinopril hydrochlorothiazide 20-25 mg by mouth daily. She admits to being intermittently compliant with this medication. The importance of compliance was stressed. We will continue this regimen and reassess after improved compliance.

## 2014-07-05 ENCOUNTER — Other Ambulatory Visit: Payer: Self-pay | Admitting: Internal Medicine

## 2014-07-05 DIAGNOSIS — I10 Essential (primary) hypertension: Secondary | ICD-10-CM

## 2014-07-05 DIAGNOSIS — E039 Hypothyroidism, unspecified: Secondary | ICD-10-CM

## 2014-12-13 DIAGNOSIS — H5203 Hypermetropia, bilateral: Secondary | ICD-10-CM | POA: Diagnosis not present

## 2014-12-13 DIAGNOSIS — H2513 Age-related nuclear cataract, bilateral: Secondary | ICD-10-CM | POA: Diagnosis not present

## 2015-05-20 ENCOUNTER — Encounter: Payer: Self-pay | Admitting: Internal Medicine

## 2015-05-20 ENCOUNTER — Ambulatory Visit (INDEPENDENT_AMBULATORY_CARE_PROVIDER_SITE_OTHER): Payer: Medicare Other | Admitting: Internal Medicine

## 2015-05-20 VITALS — BP 146/80 | HR 86 | Temp 98.4°F | Wt 152.3 lb

## 2015-05-20 DIAGNOSIS — I10 Essential (primary) hypertension: Secondary | ICD-10-CM

## 2015-05-20 DIAGNOSIS — E039 Hypothyroidism, unspecified: Secondary | ICD-10-CM | POA: Diagnosis not present

## 2015-05-20 DIAGNOSIS — M722 Plantar fascial fibromatosis: Secondary | ICD-10-CM | POA: Diagnosis not present

## 2015-05-20 DIAGNOSIS — Z0000 Encounter for general adult medical examination without abnormal findings: Secondary | ICD-10-CM

## 2015-05-20 DIAGNOSIS — L508 Other urticaria: Secondary | ICD-10-CM

## 2015-05-20 DIAGNOSIS — K219 Gastro-esophageal reflux disease without esophagitis: Secondary | ICD-10-CM

## 2015-05-20 DIAGNOSIS — E663 Overweight: Secondary | ICD-10-CM

## 2015-05-20 NOTE — Patient Instructions (Signed)
It was great to see you again.  Great job with the weight loss.  Keep it up as it will help your blood pressure.  1) Keep taking your medications as you are.  Try to take them on a daily basis.  2) I checked your thyroid and kidney function.  If there is anything to worry about I will give you a call next week when I get the results.  3) Let me know if you ever change your mind about vaccinations and preventative care.  I will see you back in 1 year, sooner if necessary.

## 2015-05-20 NOTE — Assessment & Plan Note (Signed)
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 We will continue to follow clinically as this seems to be a very infrequent event.

## 2015-05-20 NOTE — Assessment & Plan Note (Signed)
She has had intermittent trouble with left plantar fasciitis. The most recent episode occurred 2 weeks ago and is now nearly back to baseline. She will use the rolling technique under the foot to stretch it. She was interested in other exercises that she could do to help with her planter fasciitis when it does flare. I gave her a series of photographs that showed her various exercises she can use including stretching the foot dorsally using a towel, rolling a can underneath the foot to stretch the plantar fascia, rubbing the plantar fascia horizontally, stretching the legs and feet well leaning against a wall, and dorsiflexing the toes. She was appreciative of this information and will give it a try. We will reassess her symptoms of plantar fasciitis at the follow-up visit.

## 2015-05-20 NOTE — Assessment & Plan Note (Signed)
She states on a rare occasion she will have symptoms consistent with reflux disease which she takes baking soda for. She is not interested in having a pharmacologic agent for this infrequent symptom at this time. We will reassess her reflux symptoms at the follow-up visit.

## 2015-05-20 NOTE — Assessment & Plan Note (Signed)
She denies any symptoms of overt hypothyroidism such as dry skin, constipation, or brittle hair. She does admit to occasionally missing her dose of Synthroid. A TSH was obtained during this visit and is pending at the time of this dictation. We will make any adjustments in her Synthroid dose based on this with the caveat that she is not always compliant. The importance of compliance with his medication was again stressed.

## 2015-05-20 NOTE — Progress Notes (Signed)
   Subjective:    Patient ID: Vickie [name], female    DOB: 1940/10/17, 74 y.o.   MRN: [id number]  HPI  Vickie [name] is here for follow-up of her hypertension and hypothyroidism. Please see the A&P for the status of the pt's chronic medical problems.  Review of Systems  Constitutional: Negative for activity change, appetite change and unexpected weight change.  Cardiovascular: Negative for leg swelling.  Gastrointestinal: Negative for nausea, vomiting, diarrhea and constipation.  Musculoskeletal: Positive for gait problem.       Pain in the heel area of the left foot  Skin: Negative for rash.      Objective:   Physical Exam  Constitutional: She is oriented to person, place, and time. She appears well-developed and well-nourished. No distress.  HENT:  Head: Normocephalic and atraumatic.  Eyes: Conjunctivae are normal. Right eye exhibits no discharge. Left eye exhibits no discharge. No scleral icterus.  Cardiovascular: Normal rate and regular rhythm.  Exam reveals no gallop and no friction rub.   No murmur heard. Pulmonary/Chest: Effort normal and breath sounds normal. No respiratory distress. She has no wheezes. She has no rales.  Abdominal: Soft. Bowel sounds are normal. She exhibits no distension. There is no tenderness. There is no rebound and no guarding.  Musculoskeletal: Normal range of motion. She exhibits no edema or tenderness.  Neurological: She is alert and oriented to person, place, and time. She exhibits normal muscle tone.  Skin: Skin is warm and dry. No rash noted. She is not diaphoretic. No erythema.  Psychiatric: She has a normal mood and affect. Her behavior is normal. Judgment and thought content normal.  Nursing note and vitals reviewed.     Assessment & Plan:   Please see problem oriented charting.

## 2015-05-20 NOTE — Assessment & Plan Note (Signed)
She remains completely uninterested in any preventative health care. She refused to the flu vaccine, T/dap, Pneumovax, DEXA scan, and mammogram. We will broach the topic of preventative healthcare once again at the follow-up visit.

## 2015-05-20 NOTE — Assessment & Plan Note (Addendum)
Although she admits to not trying to lose weight she has in fact lost 4 pounds since the last visit and is no longer in the obese category but is now overweight. She was encouraged to continue to watch her diet and given this information that she has lost weight she was more engaged to do so. We will reassess her weight at the follow-up visit. I suspect with further weight loss her blood pressure may continue to improve.

## 2015-05-20 NOTE — Assessment & Plan Note (Signed)
Her blood pressure today was 146/80. This is while taking lisinopril-hydrochlorothiazide 20-25 mg by mouth daily. She admits she is not very compliant with this medication. I urged her to take the medication regularly and to continue to work on weight loss which she has successfully done. We will reassess her blood pressure at the follow-up visit. During this visit we obtained a basic metabolic panel which is pending at the time of this dictation but will be reviewed once results are available.

## 2015-05-21 LAB — BMP8+ANION GAP
Anion Gap: 21 mmol/L — ABNORMAL HIGH (ref 10.0–18.0)
BUN / CREAT RATIO: 10 — AB (ref 11–26)
BUN: 8 mg/dL (ref 8–27)
CO2: 20 mmol/L (ref 18–29)
CREATININE: 0.78 mg/dL (ref 0.57–1.00)
Calcium: 9.6 mg/dL (ref 8.7–10.3)
Chloride: 101 mmol/L (ref 97–108)
GFR, EST AFRICAN AMERICAN: 88 mL/min/{1.73_m2} (ref >59)
GFR, EST NON AFRICAN AMERICAN: 76 mL/min/{1.73_m2} (ref >59)
Glucose: 98 mg/dL (ref 65–99)
Potassium: 4.5 mmol/L (ref 3.5–5.2)
SODIUM: 142 mmol/L (ref 134–144)

## 2015-05-21 LAB — TSH: TSH: 6.67 u[IU]/mL — ABNORMAL HIGH (ref 0.450–4.500)

## 2015-07-19 NOTE — Progress Notes (Signed)
BMP: unremarkable  TSH 6.67 in the setting of occasional non-compliance and a lack of any symptoms suggestive of hypothyroidism.  Compliance was stressed at the visit.  At the very worst this represents subclinical hypothyroidism, although I believe it is more likely secondary to inconsistent compliance with the synthroid.  We will reassess TSH and symptoms of hypothyroidism at the follow-up visit.

## 2015-08-19 ENCOUNTER — Other Ambulatory Visit: Payer: Self-pay | Admitting: Internal Medicine

## 2015-08-19 DIAGNOSIS — I10 Essential (primary) hypertension: Secondary | ICD-10-CM

## 2015-08-19 DIAGNOSIS — E038 Other specified hypothyroidism: Secondary | ICD-10-CM

## 2015-12-14 ENCOUNTER — Encounter: Payer: Self-pay | Admitting: Internal Medicine

## 2015-12-14 ENCOUNTER — Ambulatory Visit (HOSPITAL_COMMUNITY)
Admission: RE | Admit: 2015-12-14 | Discharge: 2015-12-14 | Disposition: A | Discharge status: Home / Self Care | Payer: Medicare Other | Source: Ambulatory Visit | Attending: Internal Medicine | Admitting: Internal Medicine

## 2015-12-14 ENCOUNTER — Ambulatory Visit (INDEPENDENT_AMBULATORY_CARE_PROVIDER_SITE_OTHER): Payer: Medicare Other | Admitting: Internal Medicine

## 2015-12-14 VITALS — BP 160/60 | HR 83 | Temp 97.9°F | Wt 153.2 lb

## 2015-12-14 DIAGNOSIS — Z79899 Other long term (current) drug therapy: Secondary | ICD-10-CM | POA: Diagnosis not present

## 2015-12-14 DIAGNOSIS — K219 Gastro-esophageal reflux disease without esophagitis: Secondary | ICD-10-CM | POA: Diagnosis not present

## 2015-12-14 DIAGNOSIS — I10 Essential (primary) hypertension: Secondary | ICD-10-CM | POA: Diagnosis not present

## 2015-12-14 DIAGNOSIS — E039 Hypothyroidism, unspecified: Secondary | ICD-10-CM | POA: Diagnosis not present

## 2015-12-14 DIAGNOSIS — I4510 Unspecified right bundle-branch block: Secondary | ICD-10-CM | POA: Insufficient documentation

## 2015-12-14 DIAGNOSIS — Z9114 Patient's other noncompliance with medication regimen: Secondary | ICD-10-CM

## 2015-12-14 DIAGNOSIS — R079 Chest pain, unspecified: Secondary | ICD-10-CM | POA: Diagnosis not present

## 2015-12-14 DIAGNOSIS — E038 Other specified hypothyroidism: Secondary | ICD-10-CM

## 2015-12-14 DIAGNOSIS — E785 Hyperlipidemia, unspecified: Secondary | ICD-10-CM | POA: Diagnosis not present

## 2015-12-14 MED ORDER — PANTOPRAZOLE SODIUM 40 MG PO TBEC: 40.0000 mg | DELAYED_RELEASE_TABLET | Freq: Every day | ORAL | Status: DC

## 2015-12-14 MED ORDER — ASPIRIN 81 MG PO TABS: 81.0000 mg | ORAL_TABLET | Freq: Every day | ORAL | Status: AC

## 2015-12-14 NOTE — Assessment & Plan Note (Signed)
Pt reports not being compliant with her synthroid dosing.   -repeat TSH today  -advised compliance

## 2015-12-14 NOTE — Patient Instructions (Signed)
Thank you for your visit today.   Please return to the internal medicine clinic in about 1 month or sooner if needed.     I have made the following additions/changes to your medications:  Please take a baby aspirin daily. Please take protonix daily before breakfast for your stomach.   I have made the following referrals for you:  We will refer you to cardiology for possible stress test.  Please be sure to bring all of your medications with you to every visit; this includes herbal supplements, vitamins, eye drops, and any over-the-counter medications.   Should you have any questions regarding your medications and/or any new or worsening symptoms, please be sure to call the clinic at [phone].   If you believe that you are suffering from a life threatening condition or one that may result in the loss of limb or function, then you should call 911 and proceed to the nearest Emergency Department.   A healthy lifestyle and preventative care can promote health and wellness.   Maintain regular health, dental, and eye exams.  Eat a healthy diet. Foods like vegetables, fruits, whole grains, low-fat dairy products, and lean protein foods contain the nutrients you need without too many calories. Decrease your intake of foods high in solid fats, added sugars, and salt. Get information about a proper diet from your caregiver, if necessary.  Regular physical exercise is one of the most important things you can do for your health. Most adults should get at least 150 minutes of moderate-intensity exercise (any activity that increases your heart rate and causes you to sweat) each week. In addition, most adults need muscle-strengthening exercises on 2 or more days a week.   Maintain a healthy weight. The body mass index (BMI) is a screening tool to identify possible weight problems. It provides an estimate of body fat based on height and weight. Your caregiver can help determine your BMI, and can help  you achieve or maintain a healthy weight. For adults 20 years and older:  A BMI below 18.5 is considered underweight.  A BMI of 18.5 to 24.9 is normal.  A BMI of 25 to 29.9 is considered overweight.  A BMI of 30 and above is considered obese.

## 2015-12-14 NOTE — Assessment & Plan Note (Signed)
-  check lipid panel  

## 2015-12-14 NOTE — Assessment & Plan Note (Signed)
Pt describes a lot of "gas" and reflux symptoms and sometimes takes baking soda. -advised not to continue this as this will raise her BP -protonix daily for reflux

## 2015-12-14 NOTE — Assessment & Plan Note (Addendum)
Pt presents for chest "tightness" that began on March 30 which she reports happened when she was lying down.  She describes it as substernal without any radiation.  States the pain lasted for a few minutes and then went away.  She took some tylenol which helped.  Denies any N/V.  Does endorse some diaphoresis and lightheadedness.  She does not smoke or drink alcohol.  Strong FH of MI.   -begin 81mg  ASA -EKG today without evidence of acute TWI, ST changes  -referral to cardiology -protonix qd

## 2015-12-14 NOTE — Assessment & Plan Note (Signed)
BP 160/60. -cont meds

## 2015-12-14 NOTE — Progress Notes (Signed)
Medicine attending: Medical history, presenting problems, physical findings, and medications, reviewed with resident physician Dr Jacquelyn Gill on the day of the patient visit and I concur with her evaluation and management plan. 

## 2015-12-14 NOTE — Progress Notes (Signed)
Patient ID: Vickie KavaCarolyn D [name], female   DOB: March 28, 1941, 75 y.o.   MRN: [id number]     Subjective:   Patient ID: Vickie [name] female    DOB: March 28, 1941 75 y.o.    MRN: [id number] Health Maintenance Due: Health Maintenance Due  Topic Date Due  . TETANUS/TDAP  06/06/1961  . ZOSTAVAX  06/06/2002  . DEXA SCAN  06/07/2007  . PNA vac Low Risk Adult (1 of 2 - PCV13) 06/07/2007  . MAMMOGRAM  02/16/2012    _________________________________________________  HPI: Vickie [name] is a 75 y.o. female here for an acute visit for recurrent chest pain.  Pt has a PMH outlined below.  Please see problem-based charting assessment and plan for further status of patient's chronic medical problems addressed at today's visit.  PMH: Past Medical History  Diagnosis Date  . Essential hypertension 08/14/2006  . Hyperlipidemia LDL goal < 130 07/03/2013  . Hypothyroidism 08/14/2006  . Chronic urticaria 03/16/2013  . Gastroesophageal reflux disease 07/03/2013    Symptoms are not significant enough that patient wants pharmacologic therapy at this point   . Overweight (BMI 25.0-29.9) 07/03/2013    Medications: Current Outpatient Prescriptions on File Prior to Visit  Medication Sig Dispense Refill  . levothyroxine (SYNTHROID, LEVOTHROID) 112 MCG tablet Take 1 tablet (112 mcg total) by mouth daily before breakfast. 90 tablet 3  . lisinopril-hydrochlorothiazide (PRINZIDE,ZESTORETIC) 20-25 MG tablet Take 1 tablet by mouth daily. 90 tablet 3   No current facility-administered medications on file prior to visit.    Allergies: No Known Allergies  FH: Family History  Problem Relation Age of Onset  . Lung cancer Sister   . Healthy Sister     Unknown health/Unknown health  . Healthy Brother     Unknown health  . Heart disease Mother   . Unexplained death Father     Killed in World War II  . Healthy Son   . Healthy Son   . Healthy Son   . Healthy Sister     Unknown health  . Healthy Sister      Unknown health    SH: Social History   Social History  . Marital Status: Single    Spouse Name: N/A  . Number of Children: N/A  . Years of Education: N/A   Social History Main Topics  . Smoking status: Never Smoker   . Smokeless tobacco: Never Used  . Alcohol Use: No  . Drug Use: No  . Sexual Activity: No   Other Topics Concern  . Not on file   Social History Narrative    Review of Systems: Constitutional: Negative for fever, chills.  Eyes: Negative for blurred vision.  Respiratory: Negative for cough and +shortness of breath.  Cardiovascular: +chest pain.  Gastrointestinal: Negative for nausea, vomiting. Neurological: +dizziness.   Objective:   Vital Signs: Filed Vitals:   12/14/15 1326  BP: 160/60  Pulse: 83  Temp: 97.9 F (36.6 C)  TempSrc: Oral  Weight: 153 lb 3.2 oz (69.491 kg)  SpO2: 99%      BP Readings from Last 3 Encounters:  12/14/15 160/60  05/20/15 146/80  07/03/13 146/81    Physical Exam: Constitutional: Vital signs reviewed.  Patient is in NAD and cooperative with exam.  Head: Normocephalic and atraumatic. Eyes: EOMI, conjunctivae nl, no scleral icterus.  Neck: Supple. Cardiovascular: RRR, no MRG. Pulmonary/Chest: Normal effort, CTAB, no wheezes, rales, or rhonchi. Abdominal: Soft. NT/ND +BS. Neurological: A&O x3, cranial nerves II-XII are grossly intact, moving all  extremities. Extremities: 2+DP b/l; No LE edema. Skin: Warm, dry and intact. No rash.   Assessment & Plan:   Assessment and plan was discussed and formulated with my attending.

## 2015-12-15 ENCOUNTER — Other Ambulatory Visit: Payer: Self-pay | Admitting: Internal Medicine

## 2015-12-15 DIAGNOSIS — E038 Other specified hypothyroidism: Secondary | ICD-10-CM

## 2015-12-15 DIAGNOSIS — I10 Essential (primary) hypertension: Secondary | ICD-10-CM

## 2015-12-15 LAB — LIPID PANEL
CHOLESTEROL TOTAL: 199 mg/dL (ref 100–199)
Chol/HDL Ratio: 4.9 ratio units — ABNORMAL HIGH (ref 0.0–4.4)
HDL: 41 mg/dL (ref >39)
LDL Calculated: 119 mg/dL — ABNORMAL HIGH (ref 0–99)
TRIGLYCERIDES: 196 mg/dL — AB (ref 0–149)
VLDL Cholesterol Cal: 39 mg/dL (ref 5–40)

## 2015-12-15 LAB — TSH: TSH: 0.342 u[IU]/mL — ABNORMAL LOW (ref 0.450–4.500)

## 2015-12-15 MED ORDER — LEVOTHYROXINE SODIUM 100 MCG PO TABS: 100.0000 ug | ORAL_TABLET | Freq: Every day | ORAL | Status: DC

## 2015-12-15 MED ORDER — PRAVASTATIN SODIUM 40 MG PO TABS: 40.0000 mg | ORAL_TABLET | Freq: Every day | ORAL | Status: DC

## 2016-01-04 NOTE — Progress Notes (Signed)
Cardiology Office Note   Date:  01/05/2016   ID:  [name], DOB 1941/06/28, MRN [id number]  PCP:  Rocco Serene, MD  Cardiologist:   Chilton Si, MD   Chief Complaint  Patient presents with  . New Evaluation    Referred by Boykin Peek, MD for Chest Pain/possible Stress Test--last EKG 12/14/15  pt c/o chest discomfort/SOB on exertion--occurs daily; occasional minor lightheadedness when she stands up; seems stressed because everything seems to have happened overnight--started March 31      History of Present Illness: Vickie [name] is a 75 y.o. female with hypertension, hypothyroidism, GERD and hyperlipidemia who presents for chest pain.  Vickie [name] was evaluated by her PCP, Dr. Boykin Peek, MD on 12/14/15.  At that appointment she reported intermittent episodes of chest tightness when lying down. EKG was negative for ischemia.  She was started on aspirin and protonix.    Since March 30th she has noted daily chest discomfort.  Whenever she tries to exert herself she gets discomfort in the center of her chest that is associated with shortness of breath.  She notes it when trying to carry groceries into the house or walking to the mailbox.  If she walks for 5 minutes she then has to rest for 5 minutes.  The discomfort is substernal and is moderate.  There is no associated nausea or diaphoresis.  She occasionally notes mild lightheadedness or dizziness.  She also notes it when she is stressed.  She denies lower extremity edema, orthopnea or PND.  Heart disease is very prevalent in her family.   She reports that she took a beta blocker for years but had fatigue.  This was stopped because she did not have a cardiac history.  Her BP has been well-controlled until recently.   Past Medical History  Diagnosis Date  . Essential hypertension 08/14/2006  . Hyperlipidemia LDL goal < 130 07/03/2013  . Hypothyroidism 08/14/2006  . Chronic urticaria 03/16/2013  .  Gastroesophageal reflux disease 07/03/2013    Symptoms are not significant enough that patient wants pharmacologic therapy at this point   . Overweight (BMI 25.0-29.9) 07/03/2013    Past Surgical History  Procedure Laterality Date  . Tubal ligation    . Cholecystectomy      Current Outpatient Prescriptions  Medication Sig Dispense Refill  . Acetaminophen (TYLENOL PO) Take 1-2 tablets by mouth as needed (for chest discomfort).    Marland Kitchen aspirin 81 MG tablet Take 1 tablet (81 mg total) by mouth daily. 30 tablet 6  . levothyroxine (SYNTHROID, LEVOTHROID) 100 MCG tablet Take 1 tablet (100 mcg total) by mouth daily before breakfast. 30 tablet 1  . lisinopril-hydrochlorothiazide (PRINZIDE,ZESTORETIC) 20-25 MG tablet Take 1 tablet by mouth daily. 90 tablet 3  . pantoprazole (PROTONIX) 40 MG tablet Take 1 tablet (40 mg total) by mouth daily before breakfast. 30 tablet 0  . nebivolol (BYSTOLIC) 5 MG tablet Take 1 tablet (5 mg total) by mouth daily. 30 tablet 5  . nitroGLYCERIN (NITROSTAT) 0.4 MG SL tablet Place 1 tablet (0.4 mg total) under the tongue every 5 (five) minutes as needed for chest pain. 90 tablet 3  . pravastatin (PRAVACHOL) 40 MG tablet Take 1 tablet (40 mg total) by mouth daily. (Patient not taking: Reported on 01/05/2016) 30 tablet 3   No current facility-administered medications for this visit.    Allergies:   Review of patient's allergies indicates no known allergies.    Social History:  The patient  reports that she has never smoked. She has never used smokeless tobacco. She reports that she does not drink alcohol or use illicit drugs.   Family History:  The patient's family history includes Healthy in her brother, sister, sister, sister, son, son, and son; Heart disease in her mother; Lung cancer in her sister; Unexplained death in her father.    ROS:  Please see the history of present illness.   Otherwise, review of systems are positive for memory loss.   All other systems are  reviewed and negative.    PHYSICAL EXAM: VS:  BP 150/80 mmHg  Pulse 89  Ht 5' (1.524 m)  Wt 70.308 kg (155 lb)  BMI 30.27 kg/m2  LMP 10/20/1991 , BMI Body mass index is 30.27 kg/(m^2). GENERAL:  Well appearing HEENT:  Pupils equal round and reactive, fundi not visualized, oral mucosa unremarkable NECK:  No jugular venous distention, waveform within normal limits, carotid upstroke brisk and symmetric, no bruits, no thyromegaly LYMPHATICS:  No cervical adenopathy LUNGS:  Clear to auscultation bilaterally HEART:  RRR.  PMI not displaced or sustained,S1 and S2 within normal limits, no S3, no S4, no clicks, no rubs, II/VI, early-peaking systolic murmur at the RUSB ABD:  Flat, positive bowel sounds normal in frequency in pitch, no bruits, no rebound, no guarding, no midline pulsatile mass, no hepatomegaly, no splenomegaly EXT:  2 plus pulses throughout, no edema, no cyanosis no clubbing SKIN:  No rashes no nodules NEURO:  Cranial nerves II through XII grossly intact, motor grossly intact throughout PSYCH:  Cognitively intact, oriented to person place and time   EKG:  EKG is ordered today. The ekg ordered today demonstrates sinus rhythm rate 89 bpm.  Incomplete RBBB.    Recent Labs: 05/20/2015: BUN 8; Creatinine, Ser 0.78; Potassium 4.5; Sodium 142 12/14/2015: TSH 0.342*    Lipid Panel    Component Value Date/Time   CHOL 199 12/14/2015 1420   CHOL 212* 03/16/2013 0947   TRIG 196* 12/14/2015 1420   HDL 41 12/14/2015 1420   HDL 36* 03/16/2013 0947   CHOLHDL 4.9* 12/14/2015 1420   CHOLHDL 5.9 03/16/2013 0947   VLDL 34 03/16/2013 0947   LDLCALC 119* 12/14/2015 1420   LDLCALC 142* 03/16/2013 0947      Wt Readings from Last 3 Encounters:  01/05/16 70.308 kg (155 lb)  12/14/15 69.491 kg (153 lb 3.2 oz)  05/20/15 69.083 kg (152 lb 4.8 oz)      ASSESSMENT PLAN:  # CCS Class III angina: Vickie [name] Is having classic angina symptoms with minimal exertion. She has a high pretest  probability for coronary artery disease given her age, hypertension, hyperlipidemia, and classic symptoms. Therefore, we will refer her for cardiac catheterization tomorrow with Dr. Tresa Endo. She was advised of the risks and benefits and is amenable to undergoing cardiac catheterization. She will continue aspirin and pravastatin. We will start Nebivolol 5 mg daily, as she previously had fatigue on other beta blockers. She will continue lisinopril and hydrochlorothiazide.  She will go for precardiac cath labs today.  We will likely change her statin at the next appointment. She was given a prescription for nitroglycerin 5 mg sublingual every 5 minutes as needed. She was advised that if she continues to have chest discomfort after 3 tablets she should be seen in the emergency department.  Risks and benefits of cardiac catheterization have been discussed with the patient.  The patient understands that risks included but are not limited to stroke (1 in 1000),  death (1 in 1000), kidney failure [usually temporary] (1 in 500), bleeding (1 in 200), allergic reaction [possibly serious] (1 in 200). The patient understands and agrees to proceed.   # Hypertension: BP poorly-controlled.  We are adding nebivolol as above.  # Hyperlipidemia: Vickie [name] is currently on pravastatin. Her LDL is 119.  If she has obstructive coronary disease we will likely switch her to atorvastatin or rosuvastatin tomorrow.     Current medicines are reviewed at length with the patient today.  The patient does not have concerns regarding medicines.  The following changes have been made:  Start nebovolol and nitroglycerin.   Labs/ tests ordered today include:   Orders Placed This Encounter  Procedures  . CBC with Differential/Platelet  . PTT  . INR/PT  . Basic metabolic panel  . EKG 12-Lead  . ECHOCARDIOGRAM COMPLETE     Disposition:   FU with Taran Hable C. [name], MD, [facility] in 2 weeks.     This note was written with the  assistance of speech recognition software.  Please excuse any transcriptional errors.  Signed, Manases Etchison C. [name], MD, [facility]  01/05/2016 9:26 AM    [facility]

## 2016-01-05 ENCOUNTER — Ambulatory Visit (INDEPENDENT_AMBULATORY_CARE_PROVIDER_SITE_OTHER): Payer: Medicare Other | Admitting: Cardiovascular Disease

## 2016-01-05 ENCOUNTER — Encounter: Payer: Self-pay | Admitting: Cardiovascular Disease

## 2016-01-05 VITALS — BP 150/80 | HR 89 | Ht 60.0 in | Wt 155.0 lb

## 2016-01-05 DIAGNOSIS — E785 Hyperlipidemia, unspecified: Secondary | ICD-10-CM | POA: Diagnosis not present

## 2016-01-05 DIAGNOSIS — R011 Cardiac murmur, unspecified: Secondary | ICD-10-CM

## 2016-01-05 DIAGNOSIS — I10 Essential (primary) hypertension: Secondary | ICD-10-CM

## 2016-01-05 DIAGNOSIS — I209 Angina pectoris, unspecified: Secondary | ICD-10-CM

## 2016-01-05 LAB — CBC WITH DIFFERENTIAL/PLATELET
BASOS PCT: 1 %
Basophils Absolute: 87 cells/uL (ref 0–200)
EOS ABS: 87 {cells}/uL (ref 15–500)
Eosinophils Relative: 1 %
HEMATOCRIT: 43.6 % (ref 35.0–45.0)
HEMOGLOBIN: 14.6 g/dL (ref 11.7–15.5)
LYMPHS ABS: 3132 {cells}/uL (ref 850–3900)
Lymphocytes Relative: 36 %
MCH: 30.1 pg (ref 27.0–33.0)
MCHC: 33.5 g/dL (ref 32.0–36.0)
MCV: 89.9 fL (ref 80.0–100.0)
MONO ABS: 609 {cells}/uL (ref 200–950)
MPV: 9 fL (ref 7.5–12.5)
Monocytes Relative: 7 %
NEUTROS PCT: 55 %
Neutro Abs: 4785 cells/uL (ref 1500–7800)
Platelets: 427 10*3/uL — ABNORMAL HIGH (ref 140–400)
RBC: 4.85 MIL/uL (ref 3.80–5.10)
RDW: 14.1 % (ref 11.0–15.0)
WBC: 8.7 10*3/uL (ref 3.8–10.8)

## 2016-01-05 LAB — BASIC METABOLIC PANEL
BUN: 11 mg/dL (ref 7–25)
CO2: 28 mmol/L (ref 20–31)
CREATININE: 0.8 mg/dL (ref 0.60–0.93)
Calcium: 10.2 mg/dL (ref 8.6–10.4)
Chloride: 102 mmol/L (ref 98–110)
GLUCOSE: 95 mg/dL (ref 65–99)
Potassium: 4.5 mmol/L (ref 3.5–5.3)
Sodium: 138 mmol/L (ref 135–146)

## 2016-01-05 MED ORDER — NEBIVOLOL HCL 5 MG PO TABS: 5.0000 mg | ORAL_TABLET | Freq: Every day | ORAL | Status: DC

## 2016-01-05 MED ORDER — NITROGLYCERIN 0.4 MG SL SUBL: 0.4000 mg | SUBLINGUAL_TABLET | SUBLINGUAL | Status: DC | PRN

## 2016-01-05 NOTE — Patient Instructions (Signed)
Medication Instructions:  START BYSTOLIC 5 MG DAILY  Use your NTG under your tongue for recurrent chest pain. May take one tablet every 5 minutes. If you are still having discomfort after 3 tablets in 15 minutes, call 911.  Labwork: CBC/PT/INR/BMET/PTT AT [facility] ON FIRST FLOOR  Testing/Procedures: Your physician has requested that you have an echocardiogram. Echocardiography is a painless test that uses sound waves to create images of your heart. It provides your doctor with information about the size and shape of your heart and how well your heart's chambers and valves are working. This procedure takes approximately one hour. There are no restrictions for this procedure.  Follow-Up: Your physician recommends that you schedule a follow-up appointment in: 2 WEEKS WITH [facility]  Any Other Special Instructions Will Be Listed Below (If Applicable). You are scheduled for a cardiac catheterization on 01/06/16 with Dr. Tresa EndoKelly or associate.  [facility] at 8:30 am.  [facility] No food or drink after midnight tonight. You may take your medications with a sip of water on the day of your procedure.

## 2016-01-05 NOTE — Addendum Note (Signed)
Addended by: Chilton SiANDOLPH, Jodean Valade C on: 01/05/2016 09:31 AM   Modules accepted: Orders

## 2016-01-06 ENCOUNTER — Observation Stay (HOSPITAL_COMMUNITY)
Admission: RE | Admit: 2016-01-06 | Discharge: 2016-01-08 | Disposition: A | Discharge status: Home / Self Care | Payer: Medicare Other | Source: Ambulatory Visit | Attending: Cardiovascular Disease | Admitting: Cardiovascular Disease

## 2016-01-06 ENCOUNTER — Encounter (HOSPITAL_COMMUNITY)
Admission: RE | Disposition: A | Discharge status: Home / Self Care | Payer: Self-pay | Source: Ambulatory Visit | Attending: Cardiovascular Disease

## 2016-01-06 DIAGNOSIS — I10 Essential (primary) hypertension: Secondary | ICD-10-CM | POA: Diagnosis not present

## 2016-01-06 DIAGNOSIS — K219 Gastro-esophageal reflux disease without esophagitis: Secondary | ICD-10-CM | POA: Insufficient documentation

## 2016-01-06 DIAGNOSIS — E785 Hyperlipidemia, unspecified: Secondary | ICD-10-CM | POA: Insufficient documentation

## 2016-01-06 DIAGNOSIS — I25118 Atherosclerotic heart disease of native coronary artery with other forms of angina pectoris: Secondary | ICD-10-CM | POA: Diagnosis not present

## 2016-01-06 DIAGNOSIS — I25110 Atherosclerotic heart disease of native coronary artery with unstable angina pectoris: Secondary | ICD-10-CM

## 2016-01-06 DIAGNOSIS — I2510 Atherosclerotic heart disease of native coronary artery without angina pectoris: Secondary | ICD-10-CM | POA: Diagnosis present

## 2016-01-06 DIAGNOSIS — E663 Overweight: Secondary | ICD-10-CM | POA: Insufficient documentation

## 2016-01-06 DIAGNOSIS — E039 Hypothyroidism, unspecified: Secondary | ICD-10-CM | POA: Insufficient documentation

## 2016-01-06 DIAGNOSIS — I209 Angina pectoris, unspecified: Secondary | ICD-10-CM | POA: Diagnosis present

## 2016-01-06 DIAGNOSIS — Z79899 Other long term (current) drug therapy: Secondary | ICD-10-CM | POA: Insufficient documentation

## 2016-01-06 DIAGNOSIS — I2089 Other forms of angina pectoris: Secondary | ICD-10-CM | POA: Diagnosis present

## 2016-01-06 DIAGNOSIS — F419 Anxiety disorder, unspecified: Secondary | ICD-10-CM | POA: Diagnosis not present

## 2016-01-06 DIAGNOSIS — I208 Other forms of angina pectoris: Secondary | ICD-10-CM | POA: Diagnosis present

## 2016-01-06 DIAGNOSIS — Z7982 Long term (current) use of aspirin: Secondary | ICD-10-CM | POA: Diagnosis not present

## 2016-01-06 DIAGNOSIS — Z6830 Body mass index (BMI) 30.0-30.9, adult: Secondary | ICD-10-CM | POA: Insufficient documentation

## 2016-01-06 HISTORY — PX: CARDIAC CATHETERIZATION: SHX172

## 2016-01-06 LAB — PROTIME-INR
INR: 0.92 (ref <1.50)
Prothrombin Time: 12.5 seconds (ref 11.6–15.2)

## 2016-01-06 LAB — POCT ACTIVATED CLOTTING TIME: Activated Clotting Time: 446 seconds

## 2016-01-06 LAB — APTT: aPTT: 31 seconds (ref 24–37)

## 2016-01-06 SURGERY — LEFT HEART CATH AND CORONARY ANGIOGRAPHY

## 2016-01-06 SURGERY — LEFT HEART CATH AND CORONARY ANGIOGRAPHY
Anesthesia: Moderate Sedation

## 2016-01-06 MED ORDER — IOPAMIDOL (ISOVUE-370) INJECTION 76%
INTRAVENOUS | Status: AC
  Filled 2016-01-06: qty 100

## 2016-01-06 MED ORDER — FENTANYL CITRATE (PF) 100 MCG/2ML IJ SOLN
INTRAMUSCULAR | Status: DC | PRN
  Administered 2016-01-06 (×2): 25 ug via INTRAVENOUS

## 2016-01-06 MED ORDER — DIAZEPAM 5 MG PO TABS: 5.0000 mg | ORAL_TABLET | ORAL | Status: DC | PRN

## 2016-01-06 MED ORDER — TICAGRELOR 90 MG PO TABS
ORAL_TABLET | ORAL | Status: DC | PRN
  Administered 2016-01-06: 180 mg via ORAL

## 2016-01-06 MED ORDER — SODIUM CHLORIDE 0.9 % IV SOLN
INTRAVENOUS | Status: DC
  Administered 2016-01-06: 17:00:00 via INTRAVENOUS

## 2016-01-06 MED ORDER — HEPARIN SODIUM (PORCINE) 1000 UNIT/ML IJ SOLN
INTRAMUSCULAR | Status: AC
  Filled 2016-01-06: qty 1

## 2016-01-06 MED ORDER — BIVALIRUDIN BOLUS VIA INFUSION - CUPID
INTRAVENOUS | Status: DC | PRN
  Administered 2016-01-06: 52.725 mg via INTRAVENOUS

## 2016-01-06 MED ORDER — TICAGRELOR 90 MG PO TABS
ORAL_TABLET | ORAL | Status: AC
  Filled 2016-01-06: qty 1

## 2016-01-06 MED ORDER — ONDANSETRON HCL 4 MG/2ML IJ SOLN: 4.0000 mg | Freq: Four times a day (QID) | INTRAMUSCULAR | Status: DC | PRN

## 2016-01-06 MED ORDER — ACETAMINOPHEN 325 MG PO TABS
650.0000 mg | ORAL_TABLET | ORAL | Status: DC | PRN
  Administered 2016-01-07 – 2016-01-08 (×2): 650 mg via ORAL
  Filled 2016-01-06 (×2): qty 2

## 2016-01-06 MED ORDER — BIVALIRUDIN 250 MG IV SOLR
250.0000 mg | INTRAVENOUS | Status: DC | PRN
  Administered 2016-01-06: 1.75 mg/kg/h via INTRAVENOUS

## 2016-01-06 MED ORDER — MIDAZOLAM HCL 2 MG/2ML IJ SOLN
INTRAMUSCULAR | Status: DC | PRN
  Administered 2016-01-06 (×2): 1 mg via INTRAVENOUS

## 2016-01-06 MED ORDER — SODIUM CHLORIDE 0.9 % WEIGHT BASED INFUSION: 1.0000 mL/kg/h | INTRAVENOUS | Status: DC

## 2016-01-06 MED ORDER — IOPAMIDOL (ISOVUE-370) INJECTION 76%
INTRAVENOUS | Status: DC | PRN
  Administered 2016-01-06: 160 mL via INTRA_ARTERIAL

## 2016-01-06 MED ORDER — SODIUM CHLORIDE 0.9% FLUSH
3.0000 mL | Freq: Two times a day (BID) | INTRAVENOUS | Status: DC
  Administered 2016-01-06 – 2016-01-07 (×3): 3 mL via INTRAVENOUS

## 2016-01-06 MED ORDER — SODIUM CHLORIDE 0.9% FLUSH: 3.0000 mL | INTRAVENOUS | Status: DC | PRN

## 2016-01-06 MED ORDER — HEPARIN (PORCINE) IN NACL 2-0.9 UNIT/ML-% IJ SOLN
INTRAMUSCULAR | Status: AC
  Filled 2016-01-06: qty 1000

## 2016-01-06 MED ORDER — LIDOCAINE HCL (PF) 1 % IJ SOLN
INTRAMUSCULAR | Status: AC
  Filled 2016-01-06: qty 30

## 2016-01-06 MED ORDER — MIDAZOLAM HCL 2 MG/2ML IJ SOLN
INTRAMUSCULAR | Status: AC
  Filled 2016-01-06: qty 2

## 2016-01-06 MED ORDER — SODIUM CHLORIDE 0.9% FLUSH: 3.0000 mL | Freq: Two times a day (BID) | INTRAVENOUS | Status: DC

## 2016-01-06 MED ORDER — SODIUM CHLORIDE 0.9 % IV SOLN
INTRAVENOUS | Status: DC | PRN
  Administered 2016-01-06: 150 mL/h via INTRAVENOUS

## 2016-01-06 MED ORDER — SODIUM CHLORIDE 0.9 % IV SOLN: 250.0000 mL | INTRAVENOUS | Status: DC | PRN

## 2016-01-06 MED ORDER — LIDOCAINE HCL (PF) 1 % IJ SOLN
INTRAMUSCULAR | Status: DC | PRN
  Administered 2016-01-06: 2 mL

## 2016-01-06 MED ORDER — ASPIRIN EC 81 MG PO TBEC
81.0000 mg | DELAYED_RELEASE_TABLET | Freq: Every day | ORAL | Status: DC
  Administered 2016-01-07 – 2016-01-08 (×2): 81 mg via ORAL
  Filled 2016-01-06 (×2): qty 1

## 2016-01-06 MED ORDER — ATORVASTATIN CALCIUM 80 MG PO TABS
80.0000 mg | ORAL_TABLET | Freq: Every day | ORAL | Status: DC
  Administered 2016-01-06 – 2016-01-07 (×2): 80 mg via ORAL
  Filled 2016-01-06 (×2): qty 1

## 2016-01-06 MED ORDER — FENTANYL CITRATE (PF) 100 MCG/2ML IJ SOLN
INTRAMUSCULAR | Status: AC
  Filled 2016-01-06: qty 2

## 2016-01-06 MED ORDER — TICAGRELOR 90 MG PO TABS
90.0000 mg | ORAL_TABLET | Freq: Two times a day (BID) | ORAL | Status: DC
  Administered 2016-01-06 – 2016-01-08 (×4): 90 mg via ORAL
  Filled 2016-01-06 (×4): qty 1

## 2016-01-06 MED ORDER — VERAPAMIL HCL 2.5 MG/ML IV SOLN
INTRAVENOUS | Status: AC
  Filled 2016-01-06: qty 2

## 2016-01-06 MED ORDER — ASPIRIN 81 MG PO CHEW: 81.0000 mg | CHEWABLE_TABLET | ORAL | Status: DC

## 2016-01-06 MED ORDER — HEPARIN (PORCINE) IN NACL 2-0.9 UNIT/ML-% IJ SOLN
INTRAMUSCULAR | Status: DC | PRN
  Administered 2016-01-06: 1000 mL
  Administered 2016-01-06: 3500 mL

## 2016-01-06 MED ORDER — BIVALIRUDIN 250 MG IV SOLR
INTRAVENOUS | Status: AC
  Filled 2016-01-06: qty 250

## 2016-01-06 MED ORDER — SODIUM CHLORIDE 0.9 % WEIGHT BASED INFUSION
3.0000 mL/kg/h | INTRAVENOUS | Status: DC
  Administered 2016-01-06: 3 mL/kg/h via INTRAVENOUS

## 2016-01-06 MED ORDER — HEPARIN (PORCINE) IN NACL 2-0.9 UNIT/ML-% IJ SOLN
INTRAMUSCULAR | Status: DC | PRN
  Administered 2016-01-06: 10 mL via INTRA_ARTERIAL

## 2016-01-06 MED ORDER — NITROGLYCERIN 1 MG/10 ML FOR IR/CATH LAB
INTRA_ARTERIAL | Status: DC | PRN
  Administered 2016-01-06: 100 ug via INTRACORONARY
  Administered 2016-01-06: 200 ug via INTRACORONARY
  Administered 2016-01-06: 150 ug via INTRACORONARY

## 2016-01-06 SURGICAL SUPPLY — 15 items
BALLN ANGIOSCULPT RX 2.0X10 (BALLOONS) ×3
BALLN ~~LOC~~ EMERGE MR 2.5X8 (BALLOONS) ×3
CATH OPTITORQUE TIG 4.0 5F (CATHETERS) ×3
CATH VISTA GUIDE 6FR XB3.5 (CATHETERS) ×3
DEVICE RAD COMP TR BAND LRG (VASCULAR PRODUCTS) ×3
GLIDESHEATH SLEND A-KIT 6F 22G (SHEATH) ×3
KIT ENCORE 26 ADVANTAGE (KITS) ×3
KIT HEART LEFT (KITS) ×3
PACK CARDIAC CATHETERIZATION (CUSTOM PROCEDURE TRAY) ×3
STENT RESOLUTE INTEG 2.25X12 (Permanent Stent) ×3 IMPLANT
TRANSDUCER W/STOPCOCK (MISCELLANEOUS) ×3
TUBING CIL FLEX 10 FLL-RA (TUBING) ×3
WIRE ASAHI PROWATER 180CM (WIRE) ×3
WIRE HI TORQ VERSACORE-J 145CM (WIRE) ×3
WIRE SAFE-T 1.5MM-J .035X260CM (WIRE) ×3

## 2016-01-06 NOTE — Interval H&P Note (Signed)
History and Physical Interval Note:  01/06/2016 10:12 AM  Vickie [name]  has presented today for surgery, with the diagnosis of exertional angina and dyspnea.  The various methods of treatment have been discussed with the patient and family. After consideration of risks, benefits and other options for treatment, the patient has consented to  Procedure(s): Left Heart Cath and Coronary Angiography (N/A) as a surgical intervention .  The patient's history has been reviewed, patient examined, no change in status, stable for surgery.  I have reviewed the patient's chart and labs.  Questions were answered to the patient's satisfaction.     [name]

## 2016-01-06 NOTE — Research (Signed)
CADLAD Informed Consent   Subject Name: Vickie [name]  Subject met inclusion and exclusion criteria.  The informed consent form, study requirements and expectations were reviewed with the subject and questions and concerns were addressed prior to the signing of the consent form.  The subject verbalized understanding of the trail requirements.  The subject agreed to participate in the CADLAD trial and signed the informed consent.  The informed consent was obtained prior to performance of any protocol-specific procedures for the subject.  A copy of the signed informed consent was given to the subject and a copy was placed in the subject's medical record.  Hedrick,Shawndell Schillaci W 01/06/2016, 0945

## 2016-01-06 NOTE — Progress Notes (Signed)
TR BAND REMOVAL  LOCATION:  right radial  DEFLATED PER PROTOCOL:  Yes.    TIME BAND OFF / DRESSING APPLIED:   1730   SITE UPON ARRIVAL:   Level 0  SITE AFTER BAND REMOVAL:  Level 0  CIRCULATION SENSATION AND MOVEMENT:  Within Normal Limits  Yes.    COMMENTS:    

## 2016-01-06 NOTE — H&P (View-Only) (Signed)
Cardiology Office Note   Date:  01/05/2016   ID:  Vickie [name], DOB 1941/06/28, MRN [id number]  PCP:  Rocco Serene, MD  Cardiologist:   Chilton Si, MD   Chief Complaint  Patient presents with  . New Evaluation    Referred by Boykin Peek, MD for Chest Pain/possible Stress Test--last EKG 12/14/15  pt c/o chest discomfort/SOB on exertion--occurs daily; occasional minor lightheadedness when she stands up; seems stressed because everything seems to have happened overnight--started March 31      History of Present Illness: Vickie [name] is a 75 y.o. female with hypertension, hypothyroidism, GERD and hyperlipidemia who presents for chest pain.  Vickie [name] was evaluated by her PCP, Dr. Boykin Peek, MD on 12/14/15.  At that appointment she reported intermittent episodes of chest tightness when lying down. EKG was negative for ischemia.  She was started on aspirin and protonix.    Since March 30th she has noted daily chest discomfort.  Whenever she tries to exert herself she gets discomfort in the center of her chest that is associated with shortness of breath.  She notes it when trying to carry groceries into the house or walking to the mailbox.  If she walks for 5 minutes she then has to rest for 5 minutes.  The discomfort is substernal and is moderate.  There is no associated nausea or diaphoresis.  She occasionally notes mild lightheadedness or dizziness.  She also notes it when she is stressed.  She denies lower extremity edema, orthopnea or PND.  Heart disease is very prevalent in her family.   She reports that she took a beta blocker for years but had fatigue.  This was stopped because she did not have a cardiac history.  Her BP has been well-controlled until recently.   Past Medical History  Diagnosis Date  . Essential hypertension 08/14/2006  . Hyperlipidemia LDL goal < 130 07/03/2013  . Hypothyroidism 08/14/2006  . Chronic urticaria 03/16/2013  .  Gastroesophageal reflux disease 07/03/2013    Symptoms are not significant enough that patient wants pharmacologic therapy at this point   . Overweight (BMI 25.0-29.9) 07/03/2013    Past Surgical History  Procedure Laterality Date  . Tubal ligation    . Cholecystectomy      Current Outpatient Prescriptions  Medication Sig Dispense Refill  . Acetaminophen (TYLENOL PO) Take 1-2 tablets by mouth as needed (for chest discomfort).    Marland Kitchen aspirin 81 MG tablet Take 1 tablet (81 mg total) by mouth daily. 30 tablet 6  . levothyroxine (SYNTHROID, LEVOTHROID) 100 MCG tablet Take 1 tablet (100 mcg total) by mouth daily before breakfast. 30 tablet 1  . lisinopril-hydrochlorothiazide (PRINZIDE,ZESTORETIC) 20-25 MG tablet Take 1 tablet by mouth daily. 90 tablet 3  . pantoprazole (PROTONIX) 40 MG tablet Take 1 tablet (40 mg total) by mouth daily before breakfast. 30 tablet 0  . nebivolol (BYSTOLIC) 5 MG tablet Take 1 tablet (5 mg total) by mouth daily. 30 tablet 5  . nitroGLYCERIN (NITROSTAT) 0.4 MG SL tablet Place 1 tablet (0.4 mg total) under the tongue every 5 (five) minutes as needed for chest pain. 90 tablet 3  . pravastatin (PRAVACHOL) 40 MG tablet Take 1 tablet (40 mg total) by mouth daily. (Patient not taking: Reported on 01/05/2016) 30 tablet 3   No current facility-administered medications for this visit.    Allergies:   Review of patient's allergies indicates no known allergies.    Social History:  The patient  reports that she has never smoked. She has never used smokeless tobacco. She reports that she does not drink alcohol or use illicit drugs.   Family History:  The patient's family history includes Healthy in her brother, sister, sister, sister, son, son, and son; Heart disease in her mother; Lung cancer in her sister; Unexplained death in her father.    ROS:  Please see the history of present illness.   Otherwise, review of systems are positive for memory loss.   All other systems are  reviewed and negative.    PHYSICAL EXAM: VS:  BP 150/80 mmHg  Pulse 89  Ht 5' (1.524 m)  Wt 70.308 kg (155 lb)  BMI 30.27 kg/m2  LMP 10/20/1991 , BMI Body mass index is 30.27 kg/(m^2). GENERAL:  Well appearing HEENT:  Pupils equal round and reactive, fundi not visualized, oral mucosa unremarkable NECK:  No jugular venous distention, waveform within normal limits, carotid upstroke brisk and symmetric, no bruits, no thyromegaly LYMPHATICS:  No cervical adenopathy LUNGS:  Clear to auscultation bilaterally HEART:  RRR.  PMI not displaced or sustained,S1 and S2 within normal limits, no S3, no S4, no clicks, no rubs, II/VI, early-peaking systolic murmur at the RUSB ABD:  Flat, positive bowel sounds normal in frequency in pitch, no bruits, no rebound, no guarding, no midline pulsatile mass, no hepatomegaly, no splenomegaly EXT:  2 plus pulses throughout, no edema, no cyanosis no clubbing SKIN:  No rashes no nodules NEURO:  Cranial nerves II through XII grossly intact, motor grossly intact throughout PSYCH:  Cognitively intact, oriented to person place and time   EKG:  EKG is ordered today. The ekg ordered today demonstrates sinus rhythm rate 89 bpm.  Incomplete RBBB.    Recent Labs: 05/20/2015: BUN 8; Creatinine, Ser 0.78; Potassium 4.5; Sodium 142 12/14/2015: TSH 0.342*    Lipid Panel    Component Value Date/Time   CHOL 199 12/14/2015 1420   CHOL 212* 03/16/2013 0947   TRIG 196* 12/14/2015 1420   HDL 41 12/14/2015 1420   HDL 36* 03/16/2013 0947   CHOLHDL 4.9* 12/14/2015 1420   CHOLHDL 5.9 03/16/2013 0947   VLDL 34 03/16/2013 0947   LDLCALC 119* 12/14/2015 1420   LDLCALC 142* 03/16/2013 0947      Wt Readings from Last 3 Encounters:  01/05/16 70.308 kg (155 lb)  12/14/15 69.491 kg (153 lb 3.2 oz)  05/20/15 69.083 kg (152 lb 4.8 oz)      ASSESSMENT PLAN:  # CCS Class III angina: Vickie [name] Is having classic angina symptoms with minimal exertion. She has a high pretest  probability for coronary artery disease given her age, hypertension, hyperlipidemia, and classic symptoms. Therefore, we will refer her for cardiac catheterization tomorrow with Dr. Tresa Endo. She was advised of the risks and benefits and is amenable to undergoing cardiac catheterization. She will continue aspirin and pravastatin. We will start Nebivolol 5 mg daily, as she previously had fatigue on other beta blockers. She will continue lisinopril and hydrochlorothiazide.  She will go for precardiac cath labs today.  We will likely change her statin at the next appointment. She was given a prescription for nitroglycerin 5 mg sublingual every 5 minutes as needed. She was advised that if she continues to have chest discomfort after 3 tablets she should be seen in the emergency department.  Risks and benefits of cardiac catheterization have been discussed with the patient.  The patient understands that risks included but are not limited to stroke (1 in 1000),  death (1 in 1000), kidney failure [usually temporary] (1 in 500), bleeding (1 in 200), allergic reaction [possibly serious] (1 in 200). The patient understands and agrees to proceed.   # Hypertension: BP poorly-controlled.  We are adding nebivolol as above.  # Hyperlipidemia: Vickie [name] is currently on pravastatin. Her LDL is 119.  If she has obstructive coronary disease we will likely switch her to atorvastatin or rosuvastatin tomorrow.     Current medicines are reviewed at length with the patient today.  The patient does not have concerns regarding medicines.  The following changes have been made:  Start nebovolol and nitroglycerin.   Labs/ tests ordered today include:   Orders Placed This Encounter  Procedures  . CBC with Differential/Platelet  . PTT  . INR/PT  . Basic metabolic panel  . EKG 12-Lead  . ECHOCARDIOGRAM COMPLETE     Disposition:   FU with Kirah Stice C. [name], MD, [facility] in 2 weeks.     This note was written with the  assistance of speech recognition software.  Please excuse any transcriptional errors.  Signed, Ayane Delancey C. [name], MD, [facility]  01/05/2016 9:26 AM    [facility]

## 2016-01-06 NOTE — Care Management Note (Signed)
Case Management Note  Patient Details  Name: Michae KavaCarolyn D Boy MRN: [id number] Date of Birth: 04-25-1941  Subjective/Objective:    Patient eldest son stays with her, she is indep pta,  She states she will not need any HH services. She will be on Brilinta, she has Medicare and Medicaid.  She goes to [facility] [address] and they do have Brilinta in stock, and she has the 30 day savings card.  NCM will cont to follow for dc needs.                 Action/Plan:   Expected Discharge Date:                  Expected Discharge Plan:  Home/Self Care  In-House Referral:     Discharge planning Services  CM Consult  Post Acute Care Choice:    Choice offered to:     DME Arranged:    DME Agency:     HH Arranged:    HH Agency:     Status of Service:  Completed, signed off  Medicare Important Message Given:    Date Medicare IM Given:    Medicare IM give by:    Date Additional Medicare IM Given:    Additional Medicare Important Message give by:     If discussed at Long Length of Stay Meetings, dates discussed:    Additional Comments:  Leone Havenaylor, Deazia Lampi Clinton, RN 01/06/2016, 3:28 PM

## 2016-01-06 NOTE — Progress Notes (Signed)
Cath Lab Visit (complete for each Cath Lab visit)  Clinical Evaluation Leading to the Procedure:   ACS: No.  Non-ACS:    Anginal Classification: CCS III  Anti-ischemic medical therapy: Maximal Therapy (2 or more classes of medications)  Non-Invasive Test Results: No non-invasive testing performed  Prior CABG: No previous CABG       

## 2016-01-07 ENCOUNTER — Encounter (HOSPITAL_COMMUNITY): Payer: Self-pay | Admitting: *Deleted

## 2016-01-07 DIAGNOSIS — I25110 Atherosclerotic heart disease of native coronary artery with unstable angina pectoris: Secondary | ICD-10-CM | POA: Diagnosis not present

## 2016-01-07 DIAGNOSIS — I2510 Atherosclerotic heart disease of native coronary artery without angina pectoris: Secondary | ICD-10-CM

## 2016-01-07 DIAGNOSIS — I25118 Atherosclerotic heart disease of native coronary artery with other forms of angina pectoris: Secondary | ICD-10-CM | POA: Diagnosis not present

## 2016-01-07 DIAGNOSIS — K219 Gastro-esophageal reflux disease without esophagitis: Secondary | ICD-10-CM | POA: Diagnosis not present

## 2016-01-07 DIAGNOSIS — E785 Hyperlipidemia, unspecified: Secondary | ICD-10-CM | POA: Diagnosis not present

## 2016-01-07 DIAGNOSIS — E039 Hypothyroidism, unspecified: Secondary | ICD-10-CM | POA: Diagnosis not present

## 2016-01-07 DIAGNOSIS — I10 Essential (primary) hypertension: Secondary | ICD-10-CM | POA: Diagnosis not present

## 2016-01-07 DIAGNOSIS — E663 Overweight: Secondary | ICD-10-CM | POA: Diagnosis not present

## 2016-01-07 HISTORY — DX: Atherosclerotic heart disease of native coronary artery without angina pectoris: I25.10

## 2016-01-07 LAB — CBC
HEMATOCRIT: 37.4 % (ref 36.0–46.0)
HEMOGLOBIN: 12.3 g/dL (ref 12.0–15.0)
MCH: 30.7 pg (ref 26.0–34.0)
MCHC: 32.9 g/dL (ref 30.0–36.0)
MCV: 93.3 fL (ref 78.0–100.0)
Platelets: 341 10*3/uL (ref 150–400)
RBC: 4.01 MIL/uL (ref 3.87–5.11)
RDW: 13.8 % (ref 11.5–15.5)
WBC: 8.4 10*3/uL (ref 4.0–10.5)

## 2016-01-07 LAB — BASIC METABOLIC PANEL
ANION GAP: 9 (ref 5–15)
BUN: 10 mg/dL (ref 6–20)
CALCIUM: 8.9 mg/dL (ref 8.9–10.3)
CHLORIDE: 106 mmol/L (ref 101–111)
CO2: 23 mmol/L (ref 22–32)
Creatinine, Ser: 0.68 mg/dL (ref 0.44–1.00)
GFR calc non Af Amer: >60 mL/min (ref >60)
GLUCOSE: 110 mg/dL — AB (ref 65–99)
POTASSIUM: 3.5 mmol/L (ref 3.5–5.1)
Sodium: 138 mmol/L (ref 135–145)

## 2016-01-07 MED ORDER — ANGIOPLASTY BOOK
Freq: Once | Status: AC
Start: 2016-01-07 — End: 2016-01-07
  Administered 2016-01-07: 01:00:00
  Filled 2016-01-07: qty 1

## 2016-01-07 MED ORDER — ISOSORBIDE MONONITRATE ER 30 MG PO TB24
30.0000 mg | ORAL_TABLET | Freq: Every day | ORAL | Status: DC
  Administered 2016-01-07 – 2016-01-08 (×2): 30 mg via ORAL
  Filled 2016-01-07 (×2): qty 1

## 2016-01-07 MED ORDER — NEBIVOLOL HCL 5 MG PO TABS
2.5000 mg | ORAL_TABLET | Freq: Every day | ORAL | Status: DC
  Administered 2016-01-07 – 2016-01-08 (×2): 2.5 mg via ORAL
  Filled 2016-01-07 (×2): qty 1

## 2016-01-07 NOTE — Progress Notes (Signed)
CARDIAC REHAB PHASE I   PRE:  Rate/Rhythm: Sinus 84  BP:    Sitting: 141/53     SaO2: 95% room air  MODE:  Ambulation: 300 ft   POST:  Rate/Rhythem: Sinus tach 106  BP:    Sitting: 151/75     SaO2: 97% room air  Arta BruceMaria Walden Nicollette Wilhelmi   0800-0900 Patient ambulated 175 feet in the hallway when she began to complain of shortness of breath and chest discomfort. Ms Noralyn PickCarroll was panting some while walking in the hallway. Patient taken back to there room. RN notified. Ms Noralyn PickCarroll said her chest discomfort and shortness of breath was a level 4 on a 1-10 scale. Patient was given exercise instructions end points of exercise. Use of sublingual nitroglycerin and when to call 911. The patient's son took her stent card home. Patient was given diet instructions. Ms Noralyn PickCarroll was upset that she had chest discomfort this morning. Emotional support provided. Ms Noralyn PickCarroll says she does not think she will be interested in participating in phase 2 cardiac rehab due to the distance of her commute.

## 2016-01-07 NOTE — Progress Notes (Signed)
Patient Name: Vickie [name] Date of Encounter: 01/07/2016  Principal Problem:   Exertional angina [facility](HCC) Active Problems:   CAD in native artery   Primary Cardiologist: Dr. [name] Patient Profile: Vickie [name] is a 75 y.o. female with hypertension, hypothyroidism, GERD and hyperlipidemia who presented on 01/05/16 with chest pain. She received DES to her ostial/proximal LAD on 01/06/16.   SUBJECTIVE: Complains of chest tightness when walking. Feels somewhat SOB.   OBJECTIVE Filed Vitals:   01/06/16 1700 01/06/16 1929 01/07/16 0353 01/07/16 0821  BP: 132/48 133/55 141/60 141/53  Pulse:  91 78 95  Temp:  98.5 F (36.9 C) 98 F (36.7 C)   TempSrc:  Oral Oral Oral  Resp: 16 14 19 18   Height:      Weight:   165 lb 9.1 oz (75.1 kg)   SpO2:  96% 98% 98%    Intake/Output Summary (Last 24 hours) at 01/07/16 0831 Last data filed at 01/07/16 16100622  Gross per 24 hour  Intake 1124.42 ml  Output   2700 ml  Net -1575.58 ml   Filed Weights   01/06/16 0843 01/07/16 0353  Weight: 155 lb (70.308 kg) 165 lb 9.1 oz (75.1 kg)    PHYSICAL EXAM General: Well developed, well nourished, female in no acute distress. Head: Normocephalic, atraumatic.  Neck: Supple without bruits, No JVD. Lungs:  Resp regular and unlabored, CTA. Heart: RRR, S1, S2, no S3, S4, or murmur; no rub. Abdomen: Soft, non-tender, non-distended, BS + x 4.  Extremities: No clubbing, cyanosis,No edema.  Neuro: Alert and oriented X 3. Moves all extremities spontaneously. Psych: Normal affect.  LABS: CBC: Recent Labs  01/05/16 0946 01/07/16 0400  WBC 8.7 8.4  NEUTROABS 4785  --   HGB 14.6 12.3  HCT 43.6 37.4  MCV 89.9 93.3  PLT 427* 341   INR: Recent Labs  01/05/16 0946  INR 0.92   Basic Metabolic Panel: Recent Labs  01/05/16 0946 01/07/16 0400  NA 138 138  K 4.5 3.5  CL 102 106  CO2 28 23  GLUCOSE 95 110*  BUN 11 10  CREATININE 0.80 0.68  CALCIUM 10.2 8.9     Current  facility-administered medications:  .  0.9 %  sodium chloride infusion, 250 mL, Intravenous, PRN, Lennette Biharihomas A Kelly, MD .  0.9 %  sodium chloride infusion, , Intravenous, Continuous, Lennette Biharihomas A Kelly, MD, Stopped at 01/07/16 0300 .  acetaminophen (TYLENOL) tablet 650 mg, 650 mg, Oral, Q4H PRN, Lennette Biharihomas A Kelly, MD .  aspirin EC tablet 81 mg, 81 mg, Oral, Daily, Lennette Biharihomas A Kelly, MD, 0 mg at 01/06/16 1451 .  atorvastatin (LIPITOR) tablet 80 mg, 80 mg, Oral, q1800, Lennette Biharihomas A Kelly, MD, 80 mg at 01/06/16 1727 .  diazepam (VALIUM) tablet 5 mg, 5 mg, Oral, Q4H PRN, Lennette Biharihomas A Kelly, MD .  ondansetron [facility](ZOFRAN) injection 4 mg, 4 mg, Intravenous, Q6H PRN, Lennette Biharihomas A Kelly, MD .  sodium chloride flush (NS) 0.9 % injection 3 mL, 3 mL, Intravenous, Q12H, Lennette Biharihomas A Kelly, MD, 3 mL at 01/06/16 1400 .  sodium chloride flush (NS) 0.9 % injection 3 mL, 3 mL, Intravenous, PRN, Lennette Biharihomas A Kelly, MD .  ticagrelor [facility](BRILINTA) tablet 90 mg, 90 mg, Oral, BID, Lennette Biharihomas A Kelly, MD, 90 mg at 01/06/16 2253 . sodium chloride Stopped (01/07/16 0300)    TELE:   NSR     ECG: NSR  Coronary Stent Intervention 01/06/16  1st Mrg lesion, 95% stenosed.  Ost LAD  to Prox LAD lesion, 90% stenosed.  Successful percutaneous coronary intervention to the 90% ostial/proximal LAD stenosis utilizing Angiosculpt scoring balloon, and ultimate insertion of a 2.2512 mm Resolute DES stent postdilated to 2.5 mm with a 90% stenosis being reduced to 0%.  RECOMMENDATION: Patient will require dual antiplatelet therapy. Medical therapy will be necessary for the 95% stenosis in a very small first marginal branch of the left circumflex coronary artery. Aggressive lipid-lowering therapy will be essential. The patient will follow-up with Dr. Chilton Si.   Current Medications:  . aspirin EC  81 mg Oral Daily  . atorvastatin  80 mg Oral q1800  . sodium chloride flush  3 mL Intravenous Q12H  . ticagrelor  90 mg Oral BID   . sodium chloride Stopped  (01/07/16 0300)    ASSESSMENT AND PLAN: Principal Problem:   Exertional angina (HCC) Active Problems:   CAD in native artery  1. CAD s/p DES to LAD: Patient received 2.2512 mm Resolute DES stent to LAD on 01/06/16. She was started on DAPT with Brilinta and ASA.  She has reported in the past that she is reported that beta blockers caused fatigue, we can try to add 2.5mg  Bystolic and see if she has symptoms. She has had some ongoing exertional chest discomfort that she describes as tightness when she was walking. Repeat EKG this am looks improved when compared to EKG done earlier in the am. Will also add  isosorbide for her ongoing angina.   2. HLD: 3 weeks ago, her LDL was 119. She is on high dose statin.   3. Anxiety: She reports feeling anxious this morning, and thinks her SOB could be related to this. Can add prn alprazolam.    Signed, Little Ishikawa , NP 8:31 AM 01/07/2016 Pager [phone]  I have seen and examined the patient along with Little Ishikawa , NP.  I have reviewed the chart, notes and new data.  I agree with NP's note.  Key new complaints: she developed angina after walking with cardiac rehab nurse. Similar, slightly less severe than her pre-PCI angina Key examination changes: normal exam, no signs of CHF, healthy radial access site  PLAN: Can't tell if symptoms are due to recently treated LAD lesion (small stent 2.25 mm) or the high grade stenosis in the OM. Add beta blocker (bystolic due to fatigue with other beta blocker in the past) and nitrates. Ranexa another alternative. Reevaluate for possible DC in AM with a new antianginal regimen.  Thurmon Fair, MD, [facility] [phone] 01/07/2016, 9:03 AM

## 2016-01-08 DIAGNOSIS — I10 Essential (primary) hypertension: Secondary | ICD-10-CM | POA: Diagnosis not present

## 2016-01-08 DIAGNOSIS — K219 Gastro-esophageal reflux disease without esophagitis: Secondary | ICD-10-CM | POA: Diagnosis not present

## 2016-01-08 DIAGNOSIS — E785 Hyperlipidemia, unspecified: Secondary | ICD-10-CM | POA: Diagnosis not present

## 2016-01-08 DIAGNOSIS — I25118 Atherosclerotic heart disease of native coronary artery with other forms of angina pectoris: Secondary | ICD-10-CM | POA: Diagnosis not present

## 2016-01-08 DIAGNOSIS — E039 Hypothyroidism, unspecified: Secondary | ICD-10-CM | POA: Diagnosis not present

## 2016-01-08 DIAGNOSIS — E663 Overweight: Secondary | ICD-10-CM | POA: Diagnosis not present

## 2016-01-08 DIAGNOSIS — I25110 Atherosclerotic heart disease of native coronary artery with unstable angina pectoris: Secondary | ICD-10-CM | POA: Diagnosis not present

## 2016-01-08 MED ORDER — ISOSORBIDE MONONITRATE ER 30 MG PO TB24: 30.0000 mg | ORAL_TABLET | Freq: Every day | ORAL | Status: DC

## 2016-01-08 MED ORDER — TICAGRELOR 90 MG PO TABS: 90.0000 mg | ORAL_TABLET | Freq: Two times a day (BID) | ORAL | Status: DC

## 2016-01-08 MED ORDER — NEBIVOLOL HCL 2.5 MG PO TABS: 2.5000 mg | ORAL_TABLET | Freq: Every day | ORAL | Status: DC

## 2016-01-08 MED ORDER — ATORVASTATIN CALCIUM 80 MG PO TABS: 80.0000 mg | ORAL_TABLET | Freq: Every day | ORAL | Status: DC

## 2016-01-08 NOTE — Discharge Instructions (Signed)
Radial Site Care Refer to this sheet in the next few weeks. These instructions provide you with information about caring for yourself after your procedure. Your health care provider may also give you more specific instructions. Your treatment has been planned according to current medical practices, but problems sometimes occur. Call your health care provider if you have any problems or questions after your procedure. WHAT TO EXPECT AFTER THE PROCEDURE After your procedure, it is typical to have the following:  Bruising at the radial site that usually fades within 1-2 weeks.  Blood collecting in the tissue (hematoma) that may be painful to the touch. It should usually decrease in size and tenderness within 1-2 weeks. HOME CARE INSTRUCTIONS  Take medicines only as directed by your health care provider.  You may shower 24 hours after the procedure or as directed by your health care provider. Remove the bandage (dressing) and gently wash the site with plain soap and water. Pat the area dry with a clean towel. Do not rub the site, because this may cause bleeding.  Do not take baths, swim, or use a hot tub until your health care provider approves.  Check your insertion site every day for redness, swelling, or drainage.  Do not apply powder or lotion to the site.  Do not flex or bend the affected arm for 24 hours or as directed by your health care provider.  Do not push or pull heavy objects with the affected arm for 24 hours or as directed by your health care provider.  Do not lift over 10 lb (4.5 kg) for 5 days after your procedure or as directed by your health care provider.  Ask your health care provider when it is okay to:  Return to work or school.  Resume usual physical activities or sports.  Resume sexual activity.  Do not drive home if you are discharged the same day as the procedure. Have someone else drive you.  You may drive on Monday 1/615/15  Do not operate machinery or power  tools for 24 hours after the procedure.  Keep all follow-up visits as directed by your health care provider. This is important. SEEK MEDICAL CARE IF:  You have a fever.  You have chills.  You have increased bleeding from the radial site. Hold pressure on the site. SEEK IMMEDIATE MEDICAL CARE IF:  You have unusual pain at the radial site.  You have redness, warmth, or swelling at the radial site.  You have drainage (other than a small amount of blood on the dressing) from the radial site.  The radial site is bleeding, and the bleeding does not stop after 30 minutes of holding steady pressure on the site.  Your arm or hand becomes pale, cool, tingly, or numb.   This information is not intended to replace advice given to you by your health care provider. Make sure you discuss any questions you have with your health care provider.   Document Released: 09/15/2010 Document Revised: 09/03/2014 Document Reviewed: 03/01/2014 Elsevier Interactive Patient Education [employer]2016 [employer]

## 2016-01-08 NOTE — Progress Notes (Signed)
Patient Name: Vickie [name] Date of Encounter: 01/08/2016  Principal Problem:   Exertional angina [facility](HCC) Active Problems:   CAD in native artery   Coronary artery disease   Primary Cardiologist: Dr. [name] Patient Profile: Vickie [name] is a 75 y.o. female with hypertension, hypothyroidism, GERD and hyperlipidemia who presented on 01/05/16 with chest pain. She received DES to her ostial/proximal LAD on 01/06/16.    SUBJECTIVE: Feels well, still has some mild SOB with walking. Wants to go home.    OBJECTIVE Filed Vitals:   01/07/16 1044 01/07/16 1501 01/07/16 2100 01/08/16 0500  BP:  119/52 112/57 140/62  Pulse:  83 80 76  Temp:  98.4 F (36.9 C) 98.1 F (36.7 C) 97.7 F (36.5 C)  TempSrc:  Oral    Resp:   15 18  Height: 5' (1.524 m)     Weight: 155 lb 1.6 oz (70.353 kg)   155 lb (70.308 kg)  SpO2:  96% 94% 96%    Intake/Output Summary (Last 24 hours) at 01/08/16 0739 Last data filed at 01/08/16 0500  Gross per 24 hour  Intake    440 ml  Output    650 ml  Net   -210 ml   Filed Weights   01/07/16 0353 01/07/16 1044 01/08/16 0500  Weight: 165 lb 9.1 oz (75.1 kg) 155 lb 1.6 oz (70.353 kg) 155 lb (70.308 kg)    PHYSICAL EXAM General: Well developed, well nourished, female in no acute distress. Head: Normocephalic, atraumatic.  Neck: Supple without bruits, No JVD. Lungs:  Resp regular and unlabored, CTA. Heart: RRR, S1, S2, no S3, S4, or murmur; no rub. Abdomen: Soft, non-tender, non-distended, BS + x 4.  Extremities: No clubbing, cyanosis, No edema.  Neuro: Alert and oriented X 3. Moves all extremities spontaneously. Psych: Normal affect.  LABS: CBC: Recent Labs  01/05/16 0946 01/07/16 0400  WBC 8.7 8.4  NEUTROABS 4785  --   HGB 14.6 12.3  HCT 43.6 37.4  MCV 89.9 93.3  PLT 427* 341   INR: Recent Labs  01/05/16 0946  INR 0.92   Basic Metabolic Panel: Recent Labs  01/05/16 0946 01/07/16 0400  NA 138 138  K 4.5 3.5  CL 102 106   CO2 28 23  GLUCOSE 95 110*  BUN 11 10  CREATININE 0.80 0.68  CALCIUM 10.2 8.9     Current facility-administered medications:  .  0.9 %  sodium chloride infusion, 250 mL, Intravenous, PRN, Lennette Biharihomas A Kelly, MD .  0.9 %  sodium chloride infusion, , Intravenous, Continuous, Lennette Biharihomas A Kelly, MD, Stopped at 01/07/16 0300 .  acetaminophen (TYLENOL) tablet 650 mg, 650 mg, Oral, Q4H PRN, Lennette Biharihomas A Kelly, MD, 650 mg at 01/08/16 0105 .  aspirin EC tablet 81 mg, 81 mg, Oral, Daily, Lennette Biharihomas A Kelly, MD, 81 mg at 01/07/16 [id number] .  atorvastatin (LIPITOR) tablet 80 mg, 80 mg, Oral, q1800, Lennette Biharihomas A Kelly, MD, 80 mg at 01/07/16 1838 .  diazepam (VALIUM) tablet 5 mg, 5 mg, Oral, Q4H PRN, Lennette Biharihomas A Kelly, MD .  isosorbide mononitrate (IMDUR) 24 hr tablet 30 mg, 30 mg, Oral, Daily, Little IshikawaErin E Smith, NP, 30 mg at 01/07/16 1205 .  nebivolol (BYSTOLIC) tablet 2.5 mg, 2.5 mg, Oral, Daily, Little IshikawaErin E Smith, NP, 2.5 mg at 01/07/16 1205 .  ondansetron (ZOFRAN) injection 4 mg, 4 mg, Intravenous, Q6H PRN, Lennette Biharihomas A Kelly, MD .  sodium chloride flush (NS) 0.9 % injection 3 mL, 3 mL, Intravenous,  Q12H, Lennette Bihari, MD, 3 mL at 01/07/16 2028 .  sodium chloride flush (NS) 0.9 % injection 3 mL, 3 mL, Intravenous, PRN, Lennette Bihari, MD .  ticagrelor [facility]) tablet 90 mg, 90 mg, Oral, BID, Lennette Bihari, MD, 90 mg at 01/07/16 2028 . sodium chloride Stopped (01/07/16 0300)    TELE: NSR, 5 beat run of VT this am.        ECG: NSR  Coronary Stent Intervention 01/06/16  1st Mrg lesion, 95% stenosed.  Ost LAD to Prox LAD lesion, 90% stenosed.  Successful percutaneous coronary intervention to the 90% ostial/proximal LAD stenosis utilizing Angiosculpt scoring balloon, and ultimate insertion of a 2.2512 mm Resolute DES stent postdilated to 2.5 mm with a 90% stenosis being reduced to 0%.  RECOMMENDATION: Patient will require dual antiplatelet therapy. Medical therapy will be necessary for the 95% stenosis in a very small first  marginal branch of the left circumflex coronary artery. Aggressive lipid-lowering therapy will be essential. The patient will follow-up with Dr. Chilton Si.  Current Medications:  . aspirin EC  81 mg Oral Daily  . atorvastatin  80 mg Oral q1800  . isosorbide mononitrate  30 mg Oral Daily  . nebivolol  2.5 mg Oral Daily  . sodium chloride flush  3 mL Intravenous Q12H  . ticagrelor  90 mg Oral BID   . sodium chloride Stopped (01/07/16 0300)    ASSESSMENT AND PLAN: Principal Problem:   Exertional angina (HCC) Active Problems:   CAD in native artery   Coronary artery disease  1. CAD s/p DES to LAD: Patient received 2.2512 mm Resolute DES stent to LAD on 01/06/16. She was started on DAPT with Brilinta and ASA. She has had some ongoing exertional chest discomfort that she describes as tightness when she was walking yesterday, nebivolol and isosorbide started. Her heart rate is stable, BP is 140/62 this am. She denies chest discomfort, feels mildly SOB with activity. MD to advise on discharge.   2. HLD: On high dose statin. Continue same.   3. Anxiety: Does not want to be in the hospital anymore, and says that this is the source of her anxiety.    Signed, Little Ishikawa , NP 7:39 AM 01/08/2016 Pager [phone]  I have seen and examined the patient along with Little Ishikawa , NP.  I have reviewed the chart, notes and new data.  I agree with PA's note.  Key new complaints: no chest pain with walking Key examination changes: no signs CHF Key new findings / data: had one 5-beat of NSVT, asymptomatic  PLAN: DC home. Reinforced DAPT. Has echo scheduled 05/25 and f/u with Dr. Duke Salvia 6/1.  Thurmon Fair, MD, [facility] [phone] 01/08/2016, 8:14 AM

## 2016-01-08 NOTE — Discharge Summary (Signed)
Discharge Summary    Patient ID: Michae KavaCarolyn D Canto,  MRN: [id number], DOB/AGE: 74/07/1941 74 y.o.  Admit date: 01/06/2016 Discharge date: 01/08/2016  Primary Care Provider: Rocco SereneKLIMA, Laryah Neuser D Primary Cardiologist: [name] MD  Discharge Diagnoses    Principal Problem:   Exertional angina [facility](HCC) Active Problems:   CAD in native artery   Coronary artery disease   Allergies No Known Allergies  Diagnostic Studies/Procedures    Cardiac Cath 01/06/2016  1st Mrg lesion, 95% stenosed.  Ost LAD to Prox LAD lesion, 90% stenosed.  Successful percutaneous coronary intervention to the 90% ostial/proximal LAD stenosis utilizing Angiosculpt scoring balloon, and ultimate insertion of a 2.2512 mm Resolute DES stent postdilated to 2.5 mm with a 90% stenosis being reduced to 0%.  RECOMMENDATION: Patient will require dual antiplatelet therapy. Medical therapy will be necessary for the 95% stenosis in a very small first marginal branch of the left circumflex coronary artery. Aggressive lipid-lowering therapy will be essential. The patient will follow-up with Dr. Chilton Siiffany [place]. _____________   History of Present Illness    Mrs. Noralyn PickCarroll is a 75 year old female patient with known history of hypertension and hyperthyroidism GERD and hyperlipidemia who was seen in the emergency room on 01/04/2016 for chest discomfort. The patient had been having daily chest discomfort, worsening with exertion located in the substernal area with associated shortness of breath. The patient was admitted for class III angina with a high pretest probability for coronary artery disease with age hypertension hyperlipidemia and typical symptoms for stable angina. She was referred to Dr. Tresa EndoKelly for cardiac catheterization.   Hospital Course      The patient underwent left heart catheterization by Dr. Rubie Maidroituro on 01/06/2016. This revealed high-grade ulcerated ostial-proximal LAD artery stenosis, which was found to be  most likely culprit for patient's symptoms. The patient also had severe stenosis of the OM1 artery. Images were reviewed with Dr. Tresa EndoKelly, PCI and stent of the proximal LAD was completed. On stenosis was significant but best suited for medical therapy due to small caliber vessel.    She was started on dual antiplatelet therapy with [place] aspirin, she had a prior history of fatigue associated with beta blocker therapy, she was therefore started on a very low dose of by systolic 2.5 mg daily with close follow-up concerning symptoms. She was continued on high-dose statin. She also had significant anxiety throughout hospitalization and was started on alprazolam.    On day of discharge the patient was found to be stable without any recurrent severe chest discomfort, however she did have some ongoing exertional discomfort which she described as tightness which improved. She continues to have some mild dyspnea with activity. The patient is anxious to return home. She was seen and examined by Suzzette RighterErin Smith NP and Dr.Croituro on day of discharge and was found appropriate to return home. She will follow-up with a previously scheduled appointment with Dr. Chilton Siiffany [place] and an echocardiogram within the next 2 weeks.   _____________  Discharge Vitals Blood pressure 140/62, pulse 76, temperature 97.7 F (36.5 C), temperature source Oral, resp. rate 18, height 5' (1.524 m), weight 155 lb (70.308 kg), last menstrual period 10/20/1991, SpO2 96 %.  Filed Weights   01/07/16 0353 01/07/16 1044 01/08/16 0500  Weight: 165 lb 9.1 oz (75.1 kg) 155 lb 1.6 oz (70.353 kg) 155 lb (70.308 kg)    Labs & Radiologic Studies     CBC  Recent Labs  01/05/16 0946 01/07/16 0400  WBC 8.7 8.4  NEUTROABS  4785  --   HGB 14.6 12.3  HCT 43.6 37.4  MCV 89.9 93.3  PLT 427* 341   Basic Metabolic Panel  Recent Labs  01/05/16 0946 01/07/16 0400  NA 138 138  K 4.5 3.5  CL 102 106  CO2 28 23  GLUCOSE 95 110*  BUN 11 10    CREATININE 0.80 0.68  CALCIUM 10.2 8.9     Disposition   Pt is being discharged home today in good condition.  Follow-up Plans & Appointments    Follow-up Information    Follow up with Chilton Si, MD.   Specialty:  Cardiology   Why:  Keep appointment with Dr. Duke Salvia and for echocardiogram as scheduled.    Contact information:   [address] [place] [address] [phone]      Discharge Instructions    Diet - low sodium heart healthy    Complete by:  As directed      Increase activity slowly    Complete by:  As directed            Discharge Medications   Current Discharge Medication List    START taking these medications   Details  atorvastatin (LIPITOR) 80 MG tablet Take 1 tablet (80 mg total) by mouth daily at 6 PM. Qty: 30 tablet, Refills: 6    isosorbide mononitrate (IMDUR) 30 MG 24 hr tablet Take 1 tablet (30 mg total) by mouth daily. Qty: 30 tablet, Refills: 6    !! nebivolol (BYSTOLIC) 2.5 MG tablet Take 1 tablet (2.5 mg total) by mouth daily. Qty: 30 tablet, Refills: 6    ticagrelor (BRILINTA) 90 MG TABS tablet Take 1 tablet (90 mg total) by mouth 2 (two) times daily. Qty: 60 tablet, Refills: 10     !! - Potential duplicate medications found. Please discuss with provider.    CONTINUE these medications which have NOT CHANGED   Details  acetaminophen (TYLENOL) 500 MG tablet Take 1,000 mg by mouth at bedtime as needed for mild pain.    aspirin 81 MG tablet Take 1 tablet (81 mg total) by mouth daily. Qty: 30 tablet, Refills: 6    Carboxymethylcellul-Glycerin (REFRESH OPTIVE) 1-0.9 % GEL Place 1 application into both eyes at bedtime as needed (for dry eyes).    levothyroxine (SYNTHROID, LEVOTHROID) 100 MCG tablet Take 1 tablet (100 mcg total) by mouth daily before breakfast. Qty: 30 tablet, Refills: 1   Associated Diagnoses: Other specified hypothyroidism    lisinopril-hydrochlorothiazide (PRINZIDE,ZESTORETIC) 20-25 MG  tablet Take 1 tablet by mouth daily. Qty: 90 tablet, Refills: 3   Associated Diagnoses: Essential hypertension    pantoprazole (PROTONIX) 40 MG tablet Take 1 tablet (40 mg total) by mouth daily before breakfast. Qty: 30 tablet, Refills: 0   Associated Diagnoses: Chest pain, unspecified chest pain type    !! nebivolol (BYSTOLIC) 5 MG tablet Take 1 tablet (5 mg total) by mouth daily. Qty: 30 tablet, Refills: 5   Associated Diagnoses: Essential hypertension    nitroGLYCERIN (NITROSTAT) 0.4 MG SL tablet Place 1 tablet (0.4 mg total) under the tongue every 5 (five) minutes as needed for chest pain. Qty: 90 tablet, Refills: 3   Associated Diagnoses: Cardiac angina (HCC)     !! - Potential duplicate medications found. Please discuss with provider.       Aspirin prescribed at discharge? Yes High Intensity Statin Prescribed? Yes Beta Blocker Prescribed? Yes For EF 40% or less, Was ACEI/ARB Prescribed? Yes ADP Receptor Inhibitor Prescribed? N/A For  EF <40%, Aldosterone Inhibitor Prescribed?N/A Was EF assessed during THIS hospitalization? Yes Was Cardiac Rehab II ordered? No   Outstanding Labs/Studies  Echocardiogram: Previously scheduled  Duration of Discharge Encounter   Greater than 30 minutes including physician time.  Signed, Joni Reining NP 01/08/2016, 8:40 AM

## 2016-01-09 ENCOUNTER — Encounter: Payer: Self-pay | Admitting: Internal Medicine

## 2016-01-09 ENCOUNTER — Other Ambulatory Visit: Payer: Self-pay | Admitting: Internal Medicine

## 2016-01-09 ENCOUNTER — Encounter (HOSPITAL_COMMUNITY): Payer: Self-pay | Admitting: Cardiovascular Disease

## 2016-01-09 MED FILL — Heparin Sodium (Porcine) Inj 1000 Unit/ML: INTRAMUSCULAR | Qty: 30 | Status: AC

## 2016-01-19 ENCOUNTER — Ambulatory Visit (HOSPITAL_COMMUNITY): Payer: Medicare Other | Attending: Cardiovascular Disease

## 2016-01-19 ENCOUNTER — Other Ambulatory Visit: Payer: Self-pay

## 2016-01-19 DIAGNOSIS — I313 Pericardial effusion (noninflammatory): Secondary | ICD-10-CM | POA: Diagnosis not present

## 2016-01-19 DIAGNOSIS — I119 Hypertensive heart disease without heart failure: Secondary | ICD-10-CM | POA: Diagnosis not present

## 2016-01-19 DIAGNOSIS — R011 Cardiac murmur, unspecified: Secondary | ICD-10-CM | POA: Insufficient documentation

## 2016-01-19 DIAGNOSIS — I209 Angina pectoris, unspecified: Secondary | ICD-10-CM

## 2016-01-19 DIAGNOSIS — E785 Hyperlipidemia, unspecified: Secondary | ICD-10-CM | POA: Insufficient documentation

## 2016-01-19 DIAGNOSIS — I2510 Atherosclerotic heart disease of native coronary artery without angina pectoris: Secondary | ICD-10-CM | POA: Diagnosis not present

## 2016-01-20 ENCOUNTER — Ambulatory Visit (INDEPENDENT_AMBULATORY_CARE_PROVIDER_SITE_OTHER): Payer: Medicare Other | Admitting: Internal Medicine

## 2016-01-20 ENCOUNTER — Encounter: Payer: Self-pay | Admitting: Internal Medicine

## 2016-01-20 VITALS — BP 172/80 | HR 72 | Temp 98.1°F | Wt 155.4 lb

## 2016-01-20 DIAGNOSIS — E039 Hypothyroidism, unspecified: Secondary | ICD-10-CM

## 2016-01-20 DIAGNOSIS — M722 Plantar fascial fibromatosis: Secondary | ICD-10-CM

## 2016-01-20 DIAGNOSIS — L508 Other urticaria: Secondary | ICD-10-CM | POA: Diagnosis not present

## 2016-01-20 DIAGNOSIS — Z7982 Long term (current) use of aspirin: Secondary | ICD-10-CM | POA: Diagnosis not present

## 2016-01-20 DIAGNOSIS — I5032 Chronic diastolic (congestive) heart failure: Secondary | ICD-10-CM

## 2016-01-20 DIAGNOSIS — Z8739 Personal history of other diseases of the musculoskeletal system and connective tissue: Secondary | ICD-10-CM

## 2016-01-20 DIAGNOSIS — I25118 Atherosclerotic heart disease of native coronary artery with other forms of angina pectoris: Secondary | ICD-10-CM | POA: Diagnosis not present

## 2016-01-20 DIAGNOSIS — I110 Hypertensive heart disease with heart failure: Secondary | ICD-10-CM

## 2016-01-20 DIAGNOSIS — E785 Hyperlipidemia, unspecified: Secondary | ICD-10-CM

## 2016-01-20 DIAGNOSIS — I5189 Other ill-defined heart diseases: Secondary | ICD-10-CM | POA: Insufficient documentation

## 2016-01-20 DIAGNOSIS — K219 Gastro-esophageal reflux disease without esophagitis: Secondary | ICD-10-CM

## 2016-01-20 DIAGNOSIS — Z955 Presence of coronary angioplasty implant and graft: Secondary | ICD-10-CM | POA: Diagnosis not present

## 2016-01-20 DIAGNOSIS — Z0000 Encounter for general adult medical examination without abnormal findings: Secondary | ICD-10-CM

## 2016-01-20 DIAGNOSIS — E038 Other specified hypothyroidism: Secondary | ICD-10-CM

## 2016-01-20 DIAGNOSIS — I10 Essential (primary) hypertension: Secondary | ICD-10-CM

## 2016-01-20 HISTORY — DX: Chronic diastolic (congestive) heart failure: I50.32

## 2016-01-20 HISTORY — DX: Other ill-defined heart diseases: I51.89

## 2016-01-20 MED ORDER — RANITIDINE HCL 300 MG PO TABS: 300.0000 mg | ORAL_TABLET | Freq: Every day | ORAL | Status: DC | PRN

## 2016-01-20 MED ORDER — LEVOTHYROXINE SODIUM 100 MCG PO TABS: 100.0000 ug | ORAL_TABLET | Freq: Every day | ORAL | Status: DC

## 2016-01-20 MED ORDER — NEBIVOLOL HCL 5 MG PO TABS: 5.0000 mg | ORAL_TABLET | Freq: Every day | ORAL | Status: DC

## 2016-01-20 NOTE — Assessment & Plan Note (Signed)
Assessment  Two weeks ago she underwent percutaneous coronary intervention to the 90% ostial\proximal LAD stenosis using a drug-eluting stent for classic chest pain. Since then she is had no further ounce of chest pain. She states she's been compliant with her Brilenta, atorvastatin, aspirin, isosorbide mononitrate, and nebivolol.  Plan  We will continue the Brilenta and aspirin given the recent placement of a drug-eluting stent. We will also continue to high intensity statin atorvastatin 80 mg by mouth daily. Her nebivolol was increased to 5 mg by mouth daily as her pulse was not beta blocked and her blood pressure was not controlled. She will remain on her isosorbide mononitrate 30 mg by mouth daily until next week when she sees Dr. Duke [name] of cardiology. At that point it may be determined if she requires the isosorbide mononitrate or if a trial off of it can be tested. I will leave that up to the cardiologist in Vickie [name]. We will reassess the control of her chest pain and risk factor modification at the follow-up visit.

## 2016-01-20 NOTE — Assessment & Plan Note (Signed)
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  Plan  We will continue to monitor symptomatically.

## 2016-01-20 NOTE — Assessment & Plan Note (Signed)
Assessment  Her blood pressure today was 172/80 which is well above target. This is on nebivolol 2.5 mg by mouth daily and isosorbide mononitrate 30 mg by mouth daily.  Plan  The nebivolol was increased to 5 mg by mouth daily for both the blood pressure and pulse control. She may no longer require isosorbide mononitrate now that she has undergone successful percutaneous coronary intervention with placement of a drug-eluting stent. We will reassess the blood pressure control at the follow-up visit and make any adjustments as required to reach target levels.

## 2016-01-20 NOTE — Assessment & Plan Note (Signed)
Assessment  At the last clinic visit the patient was noted to be compliant with her Synthroid. On the dose of 112 g daily her TSH was low. Therefore her Synthroid was decreased to 100 g daily. She states she's been compliant with this medication. She is without signs or symptoms of hypothyroidism or hyperthyroidism at this time.  Plan  At the follow-up visit we will repeat the TSH to assure that it is within the reference range on the 100 g per day Synthroid dose.

## 2016-01-20 NOTE — Progress Notes (Signed)
   Subjective:    Patient ID: Vickie [name], female    DOB: 1941/08/06, 75 y.o.   MRN: [id number]  HPI  Vickie [name] is here for follow-up of chest pain after DES placement, hypertension, and gastroesophageal reflux disease. Please see the A&P for the status of the pt's chronic medical problems.  Review of Systems  Constitutional: Negative for activity change and unexpected weight change.  Respiratory: Negative for chest tightness and shortness of breath.   Cardiovascular: Negative for chest pain, palpitations and leg swelling.  Gastrointestinal: Negative for nausea, vomiting, abdominal pain, diarrhea and constipation.  Psychiatric/Behavioral: The patient is not nervous/anxious.       Objective:   Physical Exam  Constitutional: She is oriented to person, place, and time. She appears well-developed and well-nourished. No distress.  HENT:  Head: Normocephalic and atraumatic.  Eyes: Conjunctivae are normal. Right eye exhibits no discharge. Left eye exhibits no discharge. No scleral icterus.  Cardiovascular: Normal rate, regular rhythm and normal heart sounds.  Exam reveals no gallop and no friction rub.   No murmur heard. Pulmonary/Chest: Effort normal and breath sounds normal. No respiratory distress. She has no wheezes. She has no rales.  Musculoskeletal: Normal range of motion. She exhibits no edema.  Neurological: She is alert and oriented to person, place, and time.  Skin: Skin is warm and dry. She is not diaphoretic.  Psychiatric: She has a normal mood and affect. Her behavior is normal. Judgment and thought content normal.  Nursing note and vitals reviewed.     Assessment & Plan:   Please see problem based charting.

## 2016-01-20 NOTE — Assessment & Plan Note (Signed)
She continues to refuse a tetanus posterior, Pneumovax, screening DEXA scan, or screening mammography. We will continue to offer these health maintenance services at each subsequent visit should she change her mind.

## 2016-01-20 NOTE — Assessment & Plan Note (Signed)
Assessment  She was found to have grade 1 diastolic dysfunction on echocardiogram yesterday. This is likely secondary to her chronic essential hypertension as well as her underlying coronary atherosclerosis. She is completely asymptomatic from her chronic diastolic heart failure standpoint.  Plan  We have increased the nebivolol 5 mg by mouth daily in hopes of achieving better blood pressure control. We will reassess blood pressure control and look for symptoms of decompensation of her chronic diastolic heart failure at the follow-up visit.

## 2016-01-20 NOTE — Assessment & Plan Note (Signed)
Assessment  The planter fasciitis of the left foot resolved with the exercises she was provided at the last visit. She is not had recurrence since.  Plan  We will continue to follow for signs or symptoms suggesting re-exacerbation of her planter fasciitis.

## 2016-01-20 NOTE — Patient Instructions (Addendum)
It was good to see you again.  I am glad you are having no chest pain since the stent.  1) Keep taking your medications as you are with the following exceptions:  Stop taking the pantoprazole for the acid reflux.  Start ranitidine 300 mg once a day as needed for acid reflux.  Increase the Bystolic to 5 mg daily to relax your heart and lower your blood pressure so your heart isn't working so hard.  2) Follow-up with Cardiology as schedule.  I will see you in 6 months, sooner if necessary.

## 2016-01-20 NOTE — Assessment & Plan Note (Signed)
Assessment  She was recently placed on a PPI when she was experiencing chest pain is there was some concern that it could have been related to gastroesophageal reflux disease. Her chest pain resolved with the placement of her drug-eluting stent. She is since run out of her PPI and the pain that was potentially related to gastroesophageal reflux disease has not returned. That being said, she would like to have something available should she have recurrence of her gastroesophageal reflux disease.  Plan  She was given ranitidine 300 mg by mouth every 24 hours as needed for reflux. We will reassess whether or not she is required the as needed H2 blocker at the follow-up visit.

## 2016-01-20 NOTE — Assessment & Plan Note (Signed)
Assessment  She was recently placed on a high intensity statin, atorvastatin 80 mg by mouth daily at the time of her percutaneous coronary intervention. She is tolerating this medication well without myalgias.  Plan  We will continue the atorvastatin at 80 mg by mouth daily and reassess for intolerances at the follow-up visit.

## 2016-01-25 NOTE — Progress Notes (Signed)
Cardiology Office Note   Date:  01/26/2016   ID:  Vickie [name], DOB 1941-04-25, MRN [id number]  PCP:  Rocco SereneKLIMA, LAWRENCE D, MD  Cardiologist:   Chilton Siiffany Wales, MD   Chief Complaint  Patient presents with  . Follow-up    CATH w/ Stent Intervention  pt c/o occasional minor chest discomfort; no other Sx.      History of Present Illness: Vickie [name] is a 75 y.o. female with hypertension, hypothyroidism, GERD and hyperlipidemia who presents for follow up on  chest pain.  Vickie [name] was evaluated by her PCP, Dr. Boykin PeekJacquelyn Gill, MD on 12/14/15.  At that appointment she reported intermittent episodes of chest tightness when lying down. EKG was negative for ischemia.  She was started on aspirin and protonix.  She was evaluated in the clinic on 01/04/16 and her symptoms were felt to be consistent with angina. Therefore she was referred for cardiac catheterization where she was found to have an ulcerated 90% proximal LAD lesion and a 90-95% lesion in OM 1.  She underwent successful implantation of a drug-eluting stent in the LAD and the OM lesion was medically managed, as it was a small caliber vessel.  She was started on nebivolol as she has a history of fatigue on other beta blockers. An echocardiogram on 01/19/16 revealed LVEF 55-60% and grade 1 diastolic dysfunction.    Vickie [name] has been doing much better.  She has experienced very short episodes of chest discomfort.  It is very mild and does not last long.  This can occur when she gets excited.  She had an episode yesterday while working in her garden.  It lasts for a few seconds and is not associated with shortness of breath.  She has noted some mild shortness of breath since being discharged from the hospital. She denies any lower extremity edema, orthopnea or PND.  She went to see her PCP last week and her BP was noted to be high.  Bystolic was increasd to 5 mg.    Past Medical History  Diagnosis Date  . Essential hypertension  08/14/2006  . Hyperlipidemia LDL goal < 130 07/03/2013  . Hypothyroidism 08/14/2006  . Chronic urticaria 03/16/2013  . Gastroesophageal reflux disease 07/03/2013    Symptoms are not significant enough that patient wants pharmacologic therapy at this point   . Overweight (BMI 25.0-29.9) 07/03/2013  . Chronic diastolic heart failure (HCC) 01/20/2016    Grade I by Echo 01/19/2016    Past Surgical History  Procedure Laterality Date  . Tubal ligation    . Cholecystectomy    . Cardiac catheterization N/A 01/06/2016    Procedure: Left Heart Cath and Coronary Angiography;  Surgeon: Thurmon FairMihai Croitoru, MD;  Location: [facility];  Service: Cardiovascular;  Laterality: N/A;  . Cardiac catheterization N/A 01/06/2016    Procedure: Coronary Stent Intervention;  Surgeon: Lennette Biharihomas A Kelly, MD;  Location: [facility];  Service: Cardiovascular;  Laterality: N/A;    Current Outpatient Prescriptions  Medication Sig Dispense Refill  . acetaminophen (TYLENOL) 500 MG tablet Take 1,000 mg by mouth at bedtime as needed for mild pain. Reported on 01/20/2016    . aspirin 81 MG tablet Take 1 tablet (81 mg total) by mouth daily. 30 tablet 6  . atorvastatin (LIPITOR) 80 MG tablet Take 1 tablet (80 mg total) by mouth daily at 6 PM. 30 tablet 6  . Carboxymethylcellul-Glycerin (REFRESH OPTIVE) 1-0.9 % GEL Place 1 application into both eyes at  bedtime as needed (for dry eyes).    . isosorbide mononitrate (IMDUR) 60 MG 24 hr tablet Take 1 tablet (60 mg total) by mouth daily. 30 tablet 5  . levothyroxine (SYNTHROID, LEVOTHROID) 100 MCG tablet Take 1 tablet (100 mcg total) by mouth daily before breakfast. 90 tablet 3  . nebivolol (BYSTOLIC) 10 MG tablet Take 1 tablet (10 mg total) by mouth daily. 30 tablet 5  . nitroGLYCERIN (NITROSTAT) 0.4 MG SL tablet Place 1 tablet (0.4 mg total) under the tongue every 5 (five) minutes as needed for chest pain. 90 tablet 3  . ranitidine (ZANTAC) 300 MG tablet Take 1 tablet (300 mg  total) by mouth daily as needed for heartburn. 90 tablet 3  . ticagrelor (BRILINTA) 90 MG TABS tablet Take 1 tablet (90 mg total) by mouth 2 (two) times daily. 60 tablet 10   No current facility-administered medications for this visit.    Allergies:   Review of patient's allergies indicates no known allergies.    Social History:  The patient  reports that she has never smoked. She has never used smokeless tobacco. She reports that she does not drink alcohol or use illicit drugs.   Family History:  The patient's family history includes Healthy in her brother, sister, sister, sister, son, son, and son; Heart disease in her mother; Lung cancer in her sister; Unexplained death in her father.    ROS:  Please see the history of present illness.   Otherwise, review of systems are positive for memory loss.   All other systems are reviewed and negative.    PHYSICAL EXAM: VS:  BP 150/78 mmHg  Pulse 73  Ht 5' (1.524 m)  Wt 153 lb (69.4 kg)  BMI 29.88 kg/m2  LMP 10/20/1991 , BMI Body mass index is 29.88 kg/(m^2). GENERAL:  Well appearing HEENT:  Pupils equal round and reactive, fundi not visualized, oral mucosa unremarkable NECK:  No jugular venous distention, waveform within normal limits, carotid upstroke brisk and symmetric, no bruits LYMPHATICS:  No cervical adenopathy LUNGS:  Clear to auscultation bilaterally HEART:  RRR.  PMI not displaced or sustained,S1 and S2 within normal limits, no S3, no S4, no clicks, no rubs, II/VI, early-peaking systolic murmur at the RUSB ABD:  Flat, positive bowel sounds normal in frequency in pitch, no bruits, no rebound, no guarding, no midline pulsatile mass, no hepatomegaly, no splenomegaly EXT:  2 plus pulses throughout, no edema, no cyanosis no clubbing SKIN:  No rashes no nodules NEURO:  Cranial nerves II through XII grossly intact, motor grossly intact throughout PSYCH:  Cognitively intact, oriented to person place and time   EKG:  EKG is ordered  today. The ekg ordered today demonstrates sinus rhythm rate 73 bpm.  Incomplete RBBB.   Echo 01/19/16: Study Conclusions  - Left ventricle: The cavity size was normal. Wall thickness was  normal. Systolic function was normal. The estimated ejection  fraction was in the range of 55% to 60%. Wall motion was normal;  there were no regional wall motion abnormalities. Doppler  parameters are consistent with abnormal left ventricular  relaxation (grade 1 diastolic dysfunction). - Left atrium: The atrium was mildly dilated. - Pericardium, extracardiac: A trivial pericardial effusion was  identified.  LHC 01/06/16:  Angiographic Findings:  1. The left main coronary artery bifurcates in the usual fashion into the left anterior descending artery and left circumflex coronary artery. The vessel is moderately calcified. There is a 25-30% stenosis in the distal left main artery. 2.  The left anterior descending artery is a large vessel that reaches the apex and generates 2 major diagonal branches. The proximal vessel is moderately calcified. There is an ulcerated proximal LAD stenosis up to 90% in severity 3. The left circumflex coronary artery is a medium-size vessel non dominant vessel that generates two major oblique marginal arteries. There is a 90-95% stenosis in the first OM branch, but this vessel is well under 2 mm in caliber. 4. The right coronary artery is a medium-size dominant vessel that generates a single branch posterior lateral ventricular system as well as the PDA. There is evidence of minima luminal irregularities and mild calcification. No hemodynamically meaningful stenoses are seen.  5. The left ventricle is normal in size. The left ventricle systolic function is normal with an estimated ejection fraction of 60-65%. Regional wall motion abnormalities are not seen. No left ventricular thrombus is seen. There is no mitral insufficiency. The ascending aorta appears normal. There is  no aortic valve stenosis by pullback. The left ventricular end-diastolic pressure is normal.    Recent Labs: 12/14/2015: TSH 0.342* 01/07/2016: BUN 10; Creatinine, Ser 0.68; Hemoglobin 12.3; Platelets 341; Potassium 3.5; Sodium 138    Lipid Panel    Component Value Date/Time   CHOL 199 12/14/2015 1420   CHOL 212* 03/16/2013 0947   TRIG 196* 12/14/2015 1420   HDL 41 12/14/2015 1420   HDL 36* 03/16/2013 0947   CHOLHDL 4.9* 12/14/2015 1420   CHOLHDL 5.9 03/16/2013 0947   VLDL 34 03/16/2013 0947   LDLCALC 119* 12/14/2015 1420   LDLCALC 142* 03/16/2013 0947      Wt Readings from Last 3 Encounters:  01/26/16 153 lb (69.4 kg)  01/20/16 155 lb 6.4 oz (70.489 kg)  01/08/16 155 lb (70.308 kg)      ASSESSMENT PLAN:  # CAD s/p LAD PCI: Vickie [name] is doing much better after hear heart cath.  She continues to have occasional short episodes of chest pain that do not seem like angina. However, she does have an OM lesion with high-grade stenosis that was not amenable to PCI. Therefore, we will increase her Imdur to 60 mg.  Increase nebivolol to 10 mg daily. Continue aspirin, ticagrelor, and atorvastatin. She does endorse some shortness of breath but is okay with continuing ticagrelor for now.  She will need DAPT through at least 12/2016.  # Hypertension: BP poorly-controlled.  Increase nebivolol to 10 mg daily.  Increase Imdur to 60 mg daily.  # Hyperlipidemia: Her LDL was 119 prior to her heart cath. Pravastatin was stopped and she was started on atorvastatin. We will check a lipid panel and comprehensive metabolic panel at her next follow-up appointment.   Current medicines are reviewed at length with the patient today.  The patient does not have concerns regarding medicines.  The following changes have been made:  Start nebovolol and nitroglycerin.   Labs/ tests ordered today include:   No orders of the defined types were placed in this encounter.     Disposition:   FU with Joseff Luckman  C. Duke Salvia, MD, [facility] in 1 month.   This note was written with the assistance of speech recognition software.  Please excuse any transcriptional errors.  Signed, Wisam Siefring C. Duke Salvia, MD, [facility]  01/26/2016 1:08 PM    [facility]

## 2016-01-26 ENCOUNTER — Encounter: Payer: Self-pay | Admitting: Cardiovascular Disease

## 2016-01-26 ENCOUNTER — Ambulatory Visit (INDEPENDENT_AMBULATORY_CARE_PROVIDER_SITE_OTHER): Payer: Medicare Other | Admitting: Cardiovascular Disease

## 2016-01-26 VITALS — BP 150/78 | HR 73 | Ht 60.0 in | Wt 153.0 lb

## 2016-01-26 DIAGNOSIS — I209 Angina pectoris, unspecified: Secondary | ICD-10-CM

## 2016-01-26 DIAGNOSIS — E785 Hyperlipidemia, unspecified: Secondary | ICD-10-CM | POA: Diagnosis not present

## 2016-01-26 DIAGNOSIS — I10 Essential (primary) hypertension: Secondary | ICD-10-CM | POA: Diagnosis not present

## 2016-01-26 DIAGNOSIS — Z955 Presence of coronary angioplasty implant and graft: Secondary | ICD-10-CM | POA: Diagnosis not present

## 2016-01-26 DIAGNOSIS — I119 Hypertensive heart disease without heart failure: Secondary | ICD-10-CM | POA: Diagnosis not present

## 2016-01-26 MED ORDER — NEBIVOLOL HCL 10 MG PO TABS: 10.0000 mg | ORAL_TABLET | Freq: Every day | ORAL | Status: DC

## 2016-01-26 MED ORDER — ISOSORBIDE MONONITRATE ER 60 MG PO TB24: 60.0000 mg | ORAL_TABLET | Freq: Every day | ORAL | Status: DC

## 2016-01-26 NOTE — Patient Instructions (Addendum)
Medication Instructions:  INCREASE YOUR ISOSORBIDE TO 60 MG DAILY  INCREASE YOUR BYSTOLIC TO 10 MG DAILY  Labwork: WILL GET FASTING LABS WHEN YOU RETURN, ONLY WATER TO DRINK THAT MORNING   Testing/Procedures: NONE  Follow-Up: Your physician recommends that you schedule a follow-up appointment in: 02/16/16  If you need a refill on your cardiac medications before your next appointment, please call your pharmacy.

## 2016-01-26 NOTE — Addendum Note (Signed)
Addended by: Regis BillPRATT, Britanny Marksberry B on: 01/26/2016 01:18 PM   Modules accepted: Orders

## 2016-02-07 ENCOUNTER — Other Ambulatory Visit: Payer: Self-pay | Admitting: Internal Medicine

## 2016-02-15 NOTE — Progress Notes (Signed)
Cardiology Office Note   Date:  02/16/2016   ID:  Vickie [name], DOB 01/18/1941, MRN [id number]  PCP:  Rocco Serene, MD  Cardiologist:   Chilton Si, MD   Chief Complaint  Patient presents with  . Follow-up    Pt states no Sx     History of Present Illness: Vickie [name] is a 75 y.o. female with hypertension, hypothyroidism, GERD and hyperlipidemia who presents for follow up on  chest pain.  [name] reported intermittent chest tightness to her PCP, Dr. Boykin Peek, MD.  She was started on aspirin and Protonix and  evaluated in cardiology clinic on 01/04/16.She underwent cardiac catheterization on 01/06/16 where she was found to have an ulcerated 90% proximal LAD lesion and a 90-95% lesion in OM 1.  She underwent successful implantation of a drug-eluting stent in the LAD and the OM lesion was medically managed, as it was a small caliber vessel.  She was started on nebivolol as she has a history of fatigue on other beta blockers. An echocardiogram on 01/19/16 revealed LVEF 55-60% and grade 1 diastolic dysfunction.    Ms. Fanguy followed up in clinic on 5/31 and continued to note short episodes of chest discomfort and shortness of breath. Given that she has an occluded OM that was not amenable to PCI and poorly-controlled hypertension, nebivolol and Imdur were both increased.  Since then she has been Doing very well. She was able to work in her yard carrying heavy loads of dirt up an incline without any chest pain or shortness of breath. She does still have some intermittent chest tightness when she gets excited or anxious. She denies any exertional shortness of breath as well as any lower extremity edema, orthopnea or PND  Past Medical History  Diagnosis Date  . Essential hypertension 08/14/2006  . Hyperlipidemia LDL goal < 130 07/03/2013  . Hypothyroidism 08/14/2006  . Chronic urticaria 03/16/2013  . Gastroesophageal reflux disease 07/03/2013    Symptoms are not  significant enough that patient wants pharmacologic therapy at this point   . Overweight (BMI 25.0-29.9) 07/03/2013  . Chronic diastolic heart failure (HCC) 01/20/2016    Grade I by Echo 01/19/2016    Past Surgical History  Procedure Laterality Date  . Tubal ligation    . Cholecystectomy    . Cardiac catheterization N/A 01/06/2016    Procedure: Left Heart Cath and Coronary Angiography;  Surgeon: Thurmon Fair, MD;  Location: [facility];  Service: Cardiovascular;  Laterality: N/A;  . Cardiac catheterization N/A 01/06/2016    Procedure: Coronary Stent Intervention;  Surgeon: Lennette Bihari, MD;  Location: [facility];  Service: Cardiovascular;  Laterality: N/A;    Current Outpatient Prescriptions  Medication Sig Dispense Refill  . acetaminophen (TYLENOL) 500 MG tablet Take 1,000 mg by mouth at bedtime as needed for mild pain. Reported on 01/20/2016    . aspirin 81 MG tablet Take 1 tablet (81 mg total) by mouth daily. 30 tablet 6  . atorvastatin (LIPITOR) 80 MG tablet Take 1 tablet (80 mg total) by mouth daily at 6 PM. 30 tablet 6  . Carboxymethylcellul-Glycerin (REFRESH OPTIVE) 1-0.9 % GEL Place 1 application into both eyes at bedtime as needed (for dry eyes).    . isosorbide mononitrate (IMDUR) 60 MG 24 hr tablet Take 1 tablet (60 mg total) by mouth daily. 30 tablet 5  . levothyroxine (SYNTHROID, LEVOTHROID) 100 MCG tablet Take 1 tablet (100 mcg total) by mouth daily  before breakfast. 90 tablet 3  . nebivolol (BYSTOLIC) 10 MG tablet Take 1 tablet (10 mg total) by mouth daily. 30 tablet 5  . nitroGLYCERIN (NITROSTAT) 0.4 MG SL tablet Place 1 tablet (0.4 mg total) under the tongue every 5 (five) minutes as needed for chest pain. 90 tablet 3  . ranitidine (ZANTAC) 300 MG tablet Take 1 tablet (300 mg total) by mouth daily as needed for heartburn. 90 tablet 3  . ticagrelor (BRILINTA) 90 MG TABS tablet Take 1 tablet (90 mg total) by mouth 2 (two) times daily. 60 tablet 10   No current  facility-administered medications for this visit.    Allergies:   Review of patient's allergies indicates no known allergies.    Social History:  The patient  reports that she has never smoked. She has never used smokeless tobacco. She reports that she does not drink alcohol or use illicit drugs.   Family History:  The patient's family history includes Healthy in her brother, sister, sister, sister, son, son, and son; Heart disease in her mother; Lung cancer in her sister; Unexplained death in her father.    ROS:  Please see the history of present illness.   Otherwise, review of systems are positive for fatigue.   All other systems are reviewed and negative.    PHYSICAL EXAM: VS:  BP 137/76 mmHg  Pulse 76  Ht 5' (1.524 m)  Wt 151 lb 12.8 oz (68.856 kg)  BMI 29.65 kg/m2  LMP 10/20/1991 , BMI Body mass index is 29.65 kg/(m^2). GENERAL:  Well appearing HEENT:  Pupils equal round and reactive, fundi not visualized, oral mucosa unremarkable NECK:  No jugular venous distention, waveform within normal limits, carotid upstroke brisk and symmetric, no bruits LYMPHATICS:  No cervical adenopathy LUNGS:  Clear to auscultation bilaterally HEART:  RRR.  PMI not displaced or sustained,S1 and S2 within normal limits, no S3, no S4, no clicks, no rubs, II/VI, early-peaking systolic murmur at the RUSB ABD:  Flat, positive bowel sounds normal in frequency in pitch, no bruits, no rebound, no guarding, no midline pulsatile mass, no hepatomegaly, no splenomegaly EXT:  2 plus pulses throughout, no edema, no cyanosis no clubbing SKIN:  No rashes no nodules NEURO:  Cranial nerves II through XII grossly intact, motor grossly intact throughout PSYCH:  Cognitively intact, oriented to person place and time   EKG:  EKG is not ordered today. The ekg ordered 01/26/16 demonstrates sinus rhythm rate 73 bpm.  Incomplete RBBB.   Echo 01/19/16: Study Conclusions  - Left ventricle: The cavity size was normal. Wall  thickness was  normal. Systolic function was normal. The estimated ejection  fraction was in the range of 55% to 60%. Wall motion was normal;  there were no regional wall motion abnormalities. Doppler  parameters are consistent with abnormal left ventricular  relaxation (grade 1 diastolic dysfunction). - Left atrium: The atrium was mildly dilated. - Pericardium, extracardiac: A trivial pericardial effusion was  identified.  LHC 01/06/16:  Angiographic Findings:  1. The left main coronary artery bifurcates in the usual fashion into the left anterior descending artery and left circumflex coronary artery. The vessel is moderately calcified. There is a 25-30% stenosis in the distal left main artery. 2. The left anterior descending artery is a large vessel that reaches the apex and generates 2 major diagonal branches. The proximal vessel is moderately calcified. There is an ulcerated proximal LAD stenosis up to 90% in severity 3. The left circumflex coronary artery is a  medium-size vessel non dominant vessel that generates two major oblique marginal arteries. There is a 90-95% stenosis in the first OM branch, but this vessel is well under 2 mm in caliber. 4. The right coronary artery is a medium-size dominant vessel that generates a single branch posterior lateral ventricular system as well as the PDA. There is evidence of minima luminal irregularities and mild calcification. No hemodynamically meaningful stenoses are seen.  5. The left ventricle is normal in size. The left ventricle systolic function is normal with an estimated ejection fraction of 60-65%. Regional wall motion abnormalities are not seen. No left ventricular thrombus is seen. There is no mitral insufficiency. The ascending aorta appears normal. There is no aortic valve stenosis by pullback. The left ventricular end-diastolic pressure is normal.    Recent Labs: 12/14/2015: TSH 0.342* 01/07/2016: BUN 10; Creatinine, Ser 0.68;  Hemoglobin 12.3; Platelets 341; Potassium 3.5; Sodium 138    Lipid Panel    Component Value Date/Time   CHOL 199 12/14/2015 1420   CHOL 212* 03/16/2013 0947   TRIG 196* 12/14/2015 1420   HDL 41 12/14/2015 1420   HDL 36* 03/16/2013 0947   CHOLHDL 4.9* 12/14/2015 1420   CHOLHDL 5.9 03/16/2013 0947   VLDL 34 03/16/2013 0947   LDLCALC 119* 12/14/2015 1420   LDLCALC 142* 03/16/2013 0947      Wt Readings from Last 3 Encounters:  02/16/16 151 lb 12.8 oz (68.856 kg)  01/26/16 153 lb (69.4 kg)  01/20/16 155 lb 6.4 oz (70.489 kg)      ASSESSMENT PLAN:  # CAD s/p LAD PCI: Ms. Cypert is doing much better after hear heart cath. She no longer has angina. Continue nebivolol, Imdur, atorvastatin, aspirin and ticagrelor. She will need DAPT through at least 12/2016.  # Hypertension: BPwell-controlled. Continue nebivolol and Imdur.  # Hyperlipidemia: Her LDL was 119 prior to her heart cath.  Check fasting lipids and LFTs. Continue atorvastatin.   Current medicines are reviewed at length with the patient today.  The patient does not have concerns regarding medicines.  The following changes have been made:  Start nebovolol and nitroglycerin.   Labs/ tests ordered today include:   Orders Placed This Encounter  Procedures  . Lipid panel  . Comprehensive Metabolic Panel (CMET)     Disposition:   FU with Devika Dragovich C. Duke Salvia, MD, [facility] in 6 months.   This note was written with the assistance of speech recognition software.  Please excuse any transcriptional errors.  Signed, Kinslee Dalpe C. Duke Salvia, MD, [facility]  02/16/2016 10:41 AM    [facility]

## 2016-02-16 ENCOUNTER — Encounter: Payer: Self-pay | Admitting: Cardiovascular Disease

## 2016-02-16 ENCOUNTER — Ambulatory Visit (INDEPENDENT_AMBULATORY_CARE_PROVIDER_SITE_OTHER): Payer: Medicare Other | Admitting: Cardiovascular Disease

## 2016-02-16 VITALS — BP 137/76 | HR 76 | Ht 60.0 in | Wt 151.8 lb

## 2016-02-16 DIAGNOSIS — I119 Hypertensive heart disease without heart failure: Secondary | ICD-10-CM | POA: Diagnosis not present

## 2016-02-16 DIAGNOSIS — E785 Hyperlipidemia, unspecified: Secondary | ICD-10-CM | POA: Diagnosis not present

## 2016-02-16 DIAGNOSIS — Z955 Presence of coronary angioplasty implant and graft: Secondary | ICD-10-CM

## 2016-02-16 DIAGNOSIS — I209 Angina pectoris, unspecified: Secondary | ICD-10-CM

## 2016-02-16 LAB — COMPREHENSIVE METABOLIC PANEL
ALT: 24 U/L (ref 6–29)
AST: 18 U/L (ref 10–35)
Albumin: 4.4 g/dL (ref 3.6–5.1)
Alkaline Phosphatase: 67 U/L (ref 33–130)
BUN: 9 mg/dL (ref 7–25)
CO2: 26 mmol/L (ref 20–31)
Calcium: 9.7 mg/dL (ref 8.6–10.4)
Chloride: 105 mmol/L (ref 98–110)
Creat: 0.74 mg/dL (ref 0.60–0.93)
Glucose, Bld: 101 mg/dL — ABNORMAL HIGH (ref 65–99)
Potassium: 5.1 mmol/L (ref 3.5–5.3)
Sodium: 141 mmol/L (ref 135–146)
Total Bilirubin: 0.4 mg/dL (ref 0.2–1.2)
Total Protein: 6.7 g/dL (ref 6.1–8.1)

## 2016-02-16 NOTE — Patient Instructions (Signed)
Medication Instructions:  Your physician recommends that you continue on your current medications as directed. Please refer to the Current Medication list given to you today.  Labwork: LP/CMET AT [facility] ON THE FIRST FLOOR  Testing/Procedures: NONE  Follow-Up: Your physician wants you to follow-up in: 6 MONTH OV You will receive a reminder letter in the mail two months in advance. If you don't receive a letter, please call our office to schedule the follow-up appointment.  If you need a refill on your cardiac medications before your next appointment, please call your pharmacy.

## 2016-06-29 ENCOUNTER — Ambulatory Visit (INDEPENDENT_AMBULATORY_CARE_PROVIDER_SITE_OTHER): Payer: Medicare Other | Admitting: Internal Medicine

## 2016-06-29 ENCOUNTER — Encounter: Payer: Self-pay | Admitting: Internal Medicine

## 2016-06-29 VITALS — BP 138/86 | HR 63 | Temp 97.9°F | Wt 151.8 lb

## 2016-06-29 DIAGNOSIS — E039 Hypothyroidism, unspecified: Secondary | ICD-10-CM

## 2016-06-29 DIAGNOSIS — I25118 Atherosclerotic heart disease of native coronary artery with other forms of angina pectoris: Secondary | ICD-10-CM

## 2016-06-29 DIAGNOSIS — Z7982 Long term (current) use of aspirin: Secondary | ICD-10-CM

## 2016-06-29 DIAGNOSIS — K219 Gastro-esophageal reflux disease without esophagitis: Secondary | ICD-10-CM | POA: Diagnosis not present

## 2016-06-29 DIAGNOSIS — Z0000 Encounter for general adult medical examination without abnormal findings: Secondary | ICD-10-CM

## 2016-06-29 DIAGNOSIS — I10 Essential (primary) hypertension: Secondary | ICD-10-CM

## 2016-06-29 DIAGNOSIS — Z23 Encounter for immunization: Secondary | ICD-10-CM | POA: Diagnosis not present

## 2016-06-29 DIAGNOSIS — Z79899 Other long term (current) drug therapy: Secondary | ICD-10-CM

## 2016-06-29 DIAGNOSIS — E785 Hyperlipidemia, unspecified: Secondary | ICD-10-CM

## 2016-06-29 DIAGNOSIS — S7012XA Contusion of left thigh, initial encounter: Secondary | ICD-10-CM

## 2016-06-29 DIAGNOSIS — S50311A Abrasion of right elbow, initial encounter: Secondary | ICD-10-CM

## 2016-06-29 DIAGNOSIS — I5032 Chronic diastolic (congestive) heart failure: Secondary | ICD-10-CM

## 2016-06-29 DIAGNOSIS — S8002XA Contusion of left knee, initial encounter: Secondary | ICD-10-CM

## 2016-06-29 DIAGNOSIS — S70312A Abrasion, left thigh, initial encounter: Secondary | ICD-10-CM | POA: Diagnosis not present

## 2016-06-29 DIAGNOSIS — E7849 Other hyperlipidemia: Secondary | ICD-10-CM

## 2016-06-29 DIAGNOSIS — W01198A Fall on same level from slipping, tripping and stumbling with subsequent striking against other object, initial encounter: Secondary | ICD-10-CM

## 2016-06-29 DIAGNOSIS — S80212A Abrasion, left knee, initial encounter: Secondary | ICD-10-CM

## 2016-06-29 DIAGNOSIS — Y92008 Other place in unspecified non-institutional (private) residence as the place of occurrence of the external cause: Secondary | ICD-10-CM

## 2016-06-29 DIAGNOSIS — E038 Other specified hypothyroidism: Secondary | ICD-10-CM

## 2016-06-29 MED ORDER — ATORVASTATIN CALCIUM 80 MG PO TABS: 80.0000 mg | ORAL_TABLET | Freq: Every day | ORAL | 6 refills | Status: DC

## 2016-06-29 MED ORDER — ISOSORBIDE MONONITRATE ER 60 MG PO TB24: 60.0000 mg | ORAL_TABLET | Freq: Every day | ORAL | 5 refills | Status: DC

## 2016-06-29 MED ORDER — NEBIVOLOL HCL 10 MG PO TABS: 10.0000 mg | ORAL_TABLET | Freq: Every day | ORAL | 5 refills | Status: DC

## 2016-06-29 NOTE — Assessment & Plan Note (Signed)
Assessment  She has no signs or symptoms of symptomatic decompensation of her chronic diastolic heart failure on her current coronary artery disease regimen.  Plan  We will continue with her current cardiac regimen to control both her underlying coronary artery disease, hypertension, and hyperlipidemia. We will reassess for evidence of decompensation of her chronic diastolic heart failure at the follow-up visit.

## 2016-06-29 NOTE — Assessment & Plan Note (Signed)
Assessment  She has no signs or symptoms consistent with hypothyroidism on her Synthroid 100 g by mouth daily.  Plan  We checked a TSH level at this visit and the result is pending at the time of this dictation. We will make appropriate adjustments in the Synthroid dose based on the results of the TSH when available.

## 2016-06-29 NOTE — Progress Notes (Signed)
   Subjective:    Patient ID: Vickie [name], female    DOB: 1941-01-05, 75 y.o.   MRN: [id number]  HPI  Vickie KavaCarolyn D Amsden is here for follow-up of her coronary artery disease with angina, hypertension, chronic diastolic heart failure, hypothyroidism, and gastroesophageal reflux disease. Please see the A&P for the status of the pt's chronic medical problems.  She did have a fall last week which was mechanical in nature. She stated she put a flowerpot out on the porch. The following day she was carrying something out on the porch and forgot she had placed the flowerpot there and tripped over it and fell. She scraped her left knee and right elbow. She noted a large bruise with swelling over her left knee which has since improved. She denies any dizziness, chest pain, shortness of breath, or visual changes and is sure it was related to simply forgetting the flowerpot was out there and tripping over it. Otherwise she is completely without any complaints.  Review of Systems  Constitutional: Negative for activity change and unexpected weight change.  Respiratory: Negative for chest tightness and shortness of breath.   Cardiovascular: Negative for chest pain, palpitations and leg swelling.  Gastrointestinal: Negative for abdominal distention, abdominal pain, constipation, diarrhea, nausea and vomiting.  Musculoskeletal: Positive for arthralgias and joint swelling. Negative for myalgias.       The left knee arthralgia and swelling was related to the fall and has since improved.  Skin: Positive for color change and wound. Negative for rash.       The changes to the skin are secondary to bruising from the recent mechanical fall.  Neurological: Negative for dizziness, syncope, weakness and light-headedness.      Objective:   Physical Exam  Constitutional: She is oriented to person, place, and time. She appears well-developed and well-nourished. No distress.  HENT:  Head: Normocephalic and  atraumatic.  Eyes: Conjunctivae are normal. Right eye exhibits no discharge. Left eye exhibits no discharge. No scleral icterus.  Musculoskeletal: Normal range of motion. She exhibits no edema.  Neurological: She is alert and oriented to person, place, and time. She exhibits normal muscle tone.  Skin: Skin is warm and dry. No rash noted. She is not diaphoretic. No erythema.  Abrasion over right elbow and abrasion and hematoma/bruising over left knee and inner thigh.  Psychiatric: She has a normal mood and affect. Her behavior is normal. Judgment and thought content normal.  Nursing note and vitals reviewed.     Assessment & Plan:   Please see problem oriented charting.

## 2016-06-29 NOTE — Patient Instructions (Addendum)
It was good to see you again.  I am glad you are doing so well.  1) We gave you a flu shot today.  2) Keep taking the medications as you are.  3) We checked a TSH today.  If it is abnormal I will give you a call.  4) I will see you back in 1 year, sooner if necessary.

## 2016-06-29 NOTE — Assessment & Plan Note (Signed)
Assessment  She denies any chest pain even with exertion. This is on nebivolol 10 mg by mouth daily, isosorbide mononitrate 60 mg by mouth daily, aspirin 81 mg by mouth daily, Brelinta 90 mg by mouth twice daily, and atorvastatin 80 mg by mouth each night. She has not required any sublingual nitroglycerin.  Plan  We will continue the nebivolol 10 mg by mouth daily, isosorbide 60 mg by mouth daily, aspirin 81 mg by mouth daily, Brelinta 90 mg by mouth twice daily, and atorvastatin 80 mg by mouth each night. She has sublingual nitroglycerin available should she require it. She will require dual antiplatelet therapy through May 2018. We will reassess for any evidence of angina at the follow-up visit.

## 2016-06-29 NOTE — Assessment & Plan Note (Signed)
Assessment  Her blood pressure is well controlled today at 138/86. This is on nebivolol 10 mg by mouth daily and isosorbide mononitrate 60 mg by mouth daily.  Plan  We will continue the nebivolol and the isosorbide mononitrate and reassess the blood pressure at the follow-up visit.

## 2016-06-29 NOTE — Assessment & Plan Note (Signed)
She is normally very resistant to any health maintenance interventions. Surprisingly, today she was interested in the flu vaccination. Therefore this was provided. At the follow-up visit I will reassess whether or not she is interested in other health maintenance interventions.

## 2016-06-29 NOTE — Assessment & Plan Note (Signed)
Assessment  She has had occasional episodes of gastroesophageal reflux disease which has responded well to ranitidine 300 mg by mouth as needed. Although this is infrequent she would like to keep the ranitidine available when she does require it.  Plan  We will continue the ranitidine at 300 mg by mouth as needed for acid reflux. We will reassess the efficacy of this therapy and the frequency of her underlying symptoms at the follow-up visit.

## 2016-06-29 NOTE — Assessment & Plan Note (Signed)
Assessment  She is tolerating her atorvastatin 80 mg by mouth nightly without evidence of myalgias. She asked my opinion as to whether or not she needed to continue this therapy and I told her about the survival benefits associated with statin therapy in individuals with underlying coronary artery disease. I highly recommended that she continue with the atorvastatin and she was agreeable.  Plan  We will continue with this high intensity statin and reassess for evidence of intolerances at the follow-up visit.

## 2016-06-30 LAB — TSH: TSH: 0.41 u[IU]/mL — AB (ref 0.450–4.500)

## 2016-07-03 MED ORDER — LEVOTHYROXINE SODIUM 88 MCG PO TABS: 88.0000 ug | ORAL_TABLET | Freq: Every day | ORAL | 3 refills | Status: DC

## 2016-07-03 NOTE — Addendum Note (Signed)
Addended by: Doneen PoissonKLIMA, Raeanna Soberanes D on: 07/03/2016 03:45 PM   Modules accepted: Orders

## 2016-07-03 NOTE — Progress Notes (Signed)
Patient ID: Vickie [name], female   DOB: March 05, 1941, 75 y.o.   MRN: [id number]  TSH 0.410 which is slightly below the low end of normal range.  This is while on 100 mcg per day.  I called Ms. Kehl to inform her of this result.  We will lower the synthroid dose to 88 mcg each morning.  I will repeat the TSH level at the follow-up visit.  She is on board with this plan.

## 2016-07-22 ENCOUNTER — Other Ambulatory Visit: Payer: Self-pay | Admitting: Cardiovascular Disease

## 2016-07-23 NOTE — Telephone Encounter (Signed)
REFILL 

## 2016-07-23 NOTE — Telephone Encounter (Signed)
Please review for refill. Thanks!  

## 2016-08-16 ENCOUNTER — Ambulatory Visit (INDEPENDENT_AMBULATORY_CARE_PROVIDER_SITE_OTHER): Payer: Medicare Other | Admitting: Cardiovascular Disease

## 2016-08-16 ENCOUNTER — Encounter: Payer: Self-pay | Admitting: Cardiovascular Disease

## 2016-08-16 VITALS — BP 147/79 | HR 68 | Ht 60.0 in | Wt 150.6 lb

## 2016-08-16 DIAGNOSIS — I209 Angina pectoris, unspecified: Secondary | ICD-10-CM | POA: Diagnosis not present

## 2016-08-16 DIAGNOSIS — E7849 Other hyperlipidemia: Secondary | ICD-10-CM

## 2016-08-16 DIAGNOSIS — E784 Other hyperlipidemia: Secondary | ICD-10-CM

## 2016-08-16 DIAGNOSIS — I25118 Atherosclerotic heart disease of native coronary artery with other forms of angina pectoris: Secondary | ICD-10-CM

## 2016-08-16 DIAGNOSIS — I10 Essential (primary) hypertension: Secondary | ICD-10-CM | POA: Diagnosis not present

## 2016-08-16 LAB — COMPREHENSIVE METABOLIC PANEL
ALBUMIN: 4.1 g/dL (ref 3.6–5.1)
ALK PHOS: 90 U/L (ref 33–130)
ALT: 65 U/L — AB (ref 6–29)
AST: 53 U/L — ABNORMAL HIGH (ref 10–35)
BUN: 9 mg/dL (ref 7–25)
CO2: 26 mmol/L (ref 20–31)
CREATININE: 0.84 mg/dL (ref 0.60–0.93)
Calcium: 9.1 mg/dL (ref 8.6–10.4)
Chloride: 107 mmol/L (ref 98–110)
Glucose, Bld: 100 mg/dL — ABNORMAL HIGH (ref 65–99)
Potassium: 4.5 mmol/L (ref 3.5–5.3)
SODIUM: 141 mmol/L (ref 135–146)
TOTAL PROTEIN: 6.5 g/dL (ref 6.1–8.1)
Total Bilirubin: 0.6 mg/dL (ref 0.2–1.2)

## 2016-08-16 LAB — LIPID PANEL
Cholesterol: 99 mg/dL (ref <200)
HDL: 36 mg/dL — AB (ref >50)
LDL CALC: 42 mg/dL (ref <100)
TRIGLYCERIDES: 104 mg/dL (ref <150)
Total CHOL/HDL Ratio: 2.8 Ratio (ref <5.0)
VLDL: 21 mg/dL (ref <30)

## 2016-08-16 MED ORDER — LISINOPRIL 10 MG PO TABS: 10.0000 mg | ORAL_TABLET | Freq: Every day | ORAL | 3 refills | Status: DC

## 2016-08-16 NOTE — Progress Notes (Signed)
Cardiology Office Note   Date:  08/16/2016   ID:  [name], DOB 02-10-1941, MRN [id number]  PCP:  Rocco Serene, MD  Cardiologist:   Chilton Si, MD   Chief Complaint  Patient presents with  . Follow-up    6 months;     History of Present Illness: Vickie [name] is a 75 y.o. female with hypertension, hypothyroidism, GERD and hyperlipidemia who presents for follow up.  Vickie [name] reported intermittent chest tightness to her PCP, Dr. Boykin Peek, MD.  She was started on aspirin and Protonix and  evaluated in cardiology clinic on 01/04/16.She underwent cardiac catheterization on 01/06/16 where she was found to have an ulcerated 90% proximal LAD lesion and a 90-95% lesion in OM 1.  She underwent successful implantation of a drug-eluting stent in the LAD and the OM lesion was medically managed, as it was a small caliber vessel.  She was started on nebivolol as she has a history of fatigue on other beta blockers. An echocardiogram on 01/19/16 revealed LVEF 55-60% and grade 1 diastolic dysfunction.    Vickie [name] followed up in clinic on 5/31 and continued to note short episodes of chest discomfort and shortness of breath. Given that she has an occluded OM that was not amenable to PCI and poorly-controlled hypertension, nebivolol and Imdur were both increased.  Since her last appointment Vickie [name] has been doing well.  She denies chest pain or shortness of breath.  She sometimes notes an uncomfortable sensation in her chest and epigastrum after eating late at night.  She denies lower extremity edema, orthopnea or PND.  Vickie [name] notes that she has been tired.  She attributes this to not sleeping well.  She hasn't been getting formal exercise but has been very active.  She has been doing a lot of home improvement.     Past Medical History:  Diagnosis Date  . Chronic diastolic heart failure (HCC) 01/20/2016   Grade I by Echo 01/19/2016  . Chronic urticaria 03/16/2013  .  Essential hypertension 08/14/2006  . Gastroesophageal reflux disease 07/03/2013   Symptoms are not significant enough that patient wants pharmacologic therapy at this point   . Hyperlipidemia LDL goal < 130 07/03/2013  . Hypothyroidism 08/14/2006  . Overweight (BMI 25.0-29.9) 07/03/2013    Past Surgical History:  Procedure Laterality Date  . CARDIAC CATHETERIZATION N/A 01/06/2016   Procedure: Left Heart Cath and Coronary Angiography;  Surgeon: Thurmon Fair, MD;  Location: [facility];  Service: Cardiovascular;  Laterality: N/A;  . CARDIAC CATHETERIZATION N/A 01/06/2016   Procedure: Coronary Stent Intervention;  Surgeon: Lennette Bihari, MD;  Location: [facility];  Service: Cardiovascular;  Laterality: N/A;  . CHOLECYSTECTOMY    . TUBAL LIGATION      Current Outpatient Prescriptions  Medication Sig Dispense Refill  . acetaminophen (TYLENOL) 500 MG tablet Take 1,000 mg by mouth at bedtime as needed for mild pain. Reported on 01/20/2016    . aspirin 81 MG tablet Take 1 tablet (81 mg total) by mouth daily. 30 tablet 6  . atorvastatin (LIPITOR) 80 MG tablet Take 1 tablet (80 mg total) by mouth daily at 6 PM. 30 tablet 6  . isosorbide mononitrate (IMDUR) 60 MG 24 hr tablet Take 1 tablet (60 mg total) by mouth daily. KEEP OV. 30 tablet 1  . levothyroxine (SYNTHROID, LEVOTHROID) 88 MCG tablet Take 1 tablet (88 mcg total) by mouth daily before breakfast. 90 tablet 3  .  nebivolol (BYSTOLIC) 10 MG tablet Take 1 tablet (10 mg total) by mouth daily. 30 tablet 5  . nitroGLYCERIN (NITROSTAT) 0.4 MG SL tablet Place 1 tablet (0.4 mg total) under the tongue every 5 (five) minutes as needed for chest pain. 90 tablet 3  . ranitidine (ZANTAC) 300 MG tablet Take 1 tablet (300 mg total) by mouth daily as needed for heartburn. 90 tablet 3  . ticagrelor (BRILINTA) 90 MG TABS tablet Take 1 tablet (90 mg total) by mouth 2 (two) times daily. 60 tablet 10   No current facility-administered medications for  this visit.     Allergies:   Patient has no known allergies.    Social History:  The patient  reports that she has never smoked. She has never used smokeless tobacco. She reports that she does not drink alcohol or use drugs.   Family History:  The patient's family history includes Healthy in her brother, sister, sister, sister, son, son, and son; Heart disease in her mother; Lung cancer in her sister; Unexplained death in her father.    ROS:  Please see the history of present illness.   Otherwise, review of systems are positive for fatigue.   All other systems are reviewed and negative.    PHYSICAL EXAM: VS:  BP (!) 147/79   Pulse 68   Ht 5' (1.524 m)   Wt 68.3 kg (150 lb 9.6 oz)   LMP 10/20/1991   BMI 29.41 kg/m  , BMI Body mass index is 29.41 kg/m. GENERAL:  Well appearing HEENT:  Pupils equal round and reactive, fundi not visualized, oral mucosa unremarkable NECK:  No jugular venous distention, waveform within normal limits, carotid upstroke brisk and symmetric, no bruits LYMPHATICS:  No cervical adenopathy LUNGS:  Clear to auscultation bilaterally HEART:  RRR.  PMI not displaced or sustained,S1 and S2 within normal limits, no S3, no S4, no clicks, no rubs, II/VI, early-peaking systolic murmur at the RUSB ABD:  Flat, positive bowel sounds normal in frequency in pitch, no bruits, no rebound, no guarding, no midline pulsatile mass, no hepatomegaly, no splenomegaly EXT:  2 plus pulses throughout, no edema, no cyanosis no clubbing SKIN:  No rashes no nodules NEURO:  Cranial nerves II through XII grossly intact, motor grossly intact throughout PSYCH:  Cognitively intact, oriented to person place and time   EKG:  EKG is ordered today. The ekg ordered 01/26/16 demonstrates sinus rhythm rate 73 bpm.  Incomplete RBBB.  08/16/16: sinus rhythm.  Rate 68 bpm.   Echo 01/19/16: Study Conclusions  - Left ventricle: The cavity size was normal. Wall thickness was  normal. Systolic  function was normal. The estimated ejection  fraction was in the range of 55% to 60%. Wall motion was normal;  there were no regional wall motion abnormalities. Doppler  parameters are consistent with abnormal left ventricular  relaxation (grade 1 diastolic dysfunction). - Left atrium: The atrium was mildly dilated. - Pericardium, extracardiac: A trivial pericardial effusion was  identified.  LHC 01/06/16:  Angiographic Findings:  1. The left main coronary artery bifurcates in the usual fashion into the left anterior descending artery and left circumflex coronary artery. The vessel is moderately calcified. There is a 25-30% stenosis in the distal left main artery. 2. The left anterior descending artery is a large vessel that reaches the apex and generates 2 major diagonal branches. The proximal vessel is moderately calcified. There is an ulcerated proximal LAD stenosis up to 90% in severity 3. The left circumflex coronary  artery is a medium-size vessel non dominant vessel that generates two major oblique marginal arteries. There is a 90-95% stenosis in the first OM branch, but this vessel is well under 2 mm in caliber. 4. The right coronary artery is a medium-size dominant vessel that generates a single branch posterior lateral ventricular system as well as the PDA. There is evidence of minima luminal irregularities and mild calcification. No hemodynamically meaningful stenoses are seen.  5. The left ventricle is normal in size. The left ventricle systolic function is normal with an estimated ejection fraction of 60-65%. Regional wall motion abnormalities are not seen. No left ventricular thrombus is seen. There is no mitral insufficiency. The ascending aorta appears normal. There is no aortic valve stenosis by pullback. The left ventricular end-diastolic pressure is normal.    Recent Labs: 01/07/2016: Hemoglobin 12.3; Platelets 341 02/16/2016: ALT 24; BUN 9; Creat 0.74; Potassium 5.1;  Sodium 141 06/29/2016: TSH 0.410    Lipid Panel    Component Value Date/Time   CHOL 199 12/14/2015 1420   TRIG 196 (H) 12/14/2015 1420   HDL 41 12/14/2015 1420   CHOLHDL 4.9 (H) 12/14/2015 1420   CHOLHDL 5.9 03/16/2013 0947   VLDL 34 03/16/2013 0947   LDLCALC 119 (H) 12/14/2015 1420      Wt Readings from Last 3 Encounters:  08/16/16 68.3 kg (150 lb 9.6 oz)  06/29/16 68.9 kg (151 lb 12.8 oz)  02/16/16 68.9 kg (151 lb 12.8 oz)      ASSESSMENT PLAN:  # CAD s/p LAD PCI: Ms. Noralyn PickCarroll is doing well.  She denies exertional chest pain. Continue nebivolol, Imdur, atorvastatin, aspirin and ticagrelor. She will need DAPT through at least 12/2016.  # Hypertension: BP is above goal and has been high at home as well.  Continue nebivolol and Imdur.  Shart lisinopril 10mg  daily given that her heart rate is 68 bpm.   # Hyperlipidemia: Her LDL was 119 prior to her heart cath.  Check fasting lipids and LFTs. Continue atorvastatin.   Current medicines are reviewed at length with the patient today.  The patient does not have concerns regarding medicines.  The following changes have been made:  Start lisinopril 10mg  daily.   Labs/ tests ordered today include:   No orders of the defined types were placed in this encounter.    Disposition:   FU with Tessy Pawelski C. [name], MD, [facility] in 6 months.   This note was written with the assistance of speech recognition software.  Please excuse any transcriptional errors.  Signed, Milderd Manocchio C. [name], MD, [facility]  08/16/2016 8:24 AM    [facility]

## 2016-08-16 NOTE — Patient Instructions (Signed)
Medication Instructions:  START LISINOPRIL 10 MG DAILY   Labwork: LP/CMET AT [facility] ON THE FIRST FLOOR  Testing/Procedures: NONE  Follow-Up: Your physician recommends that you schedule a follow-up appointment in: 1 MONTH OV  If you need a refill on your cardiac medications before your next appointment, please call your pharmacy.

## 2016-08-29 ENCOUNTER — Telehealth: Payer: Self-pay | Admitting: *Deleted

## 2016-08-29 DIAGNOSIS — R748 Abnormal levels of other serum enzymes: Secondary | ICD-10-CM

## 2016-08-29 NOTE — Telephone Encounter (Signed)
-----   Message from Chilton Siiffany [place], MD sent at 08/25/2016  9:10 AM EST ----- Cholesterol levels are excellent.  However, liver enzymes are now elevated.  Stop atorvastatin in two weeks and then repeat LFTs.  If normal, we will try a different statin.

## 2016-08-29 NOTE — Telephone Encounter (Signed)
Advised patient of lab results and to recheck in 2 weeks, orders placed

## 2016-09-20 ENCOUNTER — Encounter: Payer: Self-pay | Admitting: *Deleted

## 2016-09-20 ENCOUNTER — Other Ambulatory Visit: Payer: Self-pay | Admitting: Cardiovascular Disease

## 2016-09-20 NOTE — Telephone Encounter (Signed)
Rx request sent to pharmacy.  

## 2016-09-20 NOTE — Telephone Encounter (Signed)
Refill Request.  

## 2016-10-01 ENCOUNTER — Ambulatory Visit: Payer: Medicare Other | Admitting: Cardiovascular Disease

## 2016-11-20 NOTE — Progress Notes (Signed)
Cardiology Office Note   Date:  11/21/2016   ID:  Vickie ALERS, DO 1941/04/12, MRN [id number]  PCP:  Rocco Serene, MD  Cardiologist:   Chilton Si, MD   Chief Complaint  Patient presents with  . Follow-up  . Shortness of Breath    exertion     History of Present Illness: Vickie [name] is a 76 y.o. female with hypertension, hypothyroidism, GERD and hyperlipidemia who presents for follow up.  Vickie [name] reported intermittent chest tightness to her PCP, Dr. Boykin Peek, MD.  She was started on aspirin and Protonix and  evaluated in cardiology clinic on 01/04/16.  She underwent cardiac catheterization on 01/06/16 where she was found to have an ulcerated 90% proximal LAD lesion and a 90-95% lesion in OM 1.  She underwent successful implantation of a drug-eluting stent in the LAD and the OM lesion was medically managed, as it was a small caliber vessel.  She was started on nebivolol as she has a history of fatigue on other beta blockers. An echocardiogram on 01/19/16 revealed LVEF 55-60% and grade 1 diastolic dysfunction.  She had recurrent chest discomfort so both nebivolol and Imdur were increased with improvement in her symptoms. At her last appointment her blood pressure was poorly-controlled so she was started on lisinopril.  Since her last appointment Vickie [name] had liver enzymes checked and were found to be elevated. Atorvastatin was discontinued. She was instructed to stop atorvastatin and repeat her lipids, but this has not yet occurred due to insurance concerns.  She has been feeling well. She notes some mild shortness of breath with significant exertion. She has not started exercising but has been working in her garden and painting in her home.   She denies chest pain, orthopnea or lower extremity edema.   she occasionally checks her blood pressure when she goes to a convenience store. She is unsure whether it was 129/139 when she last checked.   Past Medical  History:  Diagnosis Date  . Chronic diastolic heart failure (HCC) 01/20/2016   Grade I by Echo 01/19/2016  . Chronic urticaria 03/16/2013  . Essential hypertension 08/14/2006  . Gastroesophageal reflux disease 07/03/2013   Symptoms are not significant enough that patient wants pharmacologic therapy at this point   . Hyperlipidemia LDL goal < 130 07/03/2013  . Hypothyroidism 08/14/2006  . Overweight (BMI 25.0-29.9) 07/03/2013    Past Surgical History:  Procedure Laterality Date  . CARDIAC CATHETERIZATION N/A 01/06/2016   Procedure: Left Heart Cath and Coronary Angiography;  Surgeon: Thurmon Fair, MD;  [facility]: [facility];  Service: Cardiovascular;  Laterality: N/A;  . CARDIAC CATHETERIZATION N/A 01/06/2016   Procedure: Coronary Stent Intervention;  Surgeon: Lennette Bihari, MD;  Location: [facility];  Service: Cardiovascular;  Laterality: N/A;  . CHOLECYSTECTOMY    . TUBAL LIGATION      Current Outpatient Prescriptions  Medication Sig Dispense Refill  . acetaminophen (TYLENOL) 500 MG tablet Take 1,000 mg by mouth at bedtime as needed for mild pain. Reported on 01/20/2016    . aspirin 81 MG tablet Take 1 tablet (81 mg total) by mouth daily. 30 tablet 6  . isosorbide mononitrate (IMDUR) 60 MG 24 hr tablet TAKE 1 TABLET (60 MG TOTAL) BY MOUTH DAILY. KEEP OV. 30 tablet 1  . levothyroxine (SYNTHROID, LEVOTHROID) 88 MCG tablet Take 1 tablet (88 mcg total) by mouth daily before breakfast. 90 tablet 3  . lisinopril (PRINIVIL,ZESTRIL) 20 MG tablet Take  1 tablet (20 mg total) by mouth daily. 30 tablet 3  . nebivolol (BYSTOLIC) 10 MG tablet Take 1 tablet (10 mg total) by mouth daily. 30 tablet 5  . nitroGLYCERIN (NITROSTAT) 0.4 MG SL tablet Place 1 tablet (0.4 mg total) under the tongue every 5 (five) minutes as needed for chest pain. 90 tablet 3  . ranitidine (ZANTAC) 300 MG tablet Take 1 tablet (300 mg total) by mouth daily as needed for heartburn. 90 tablet 3  . ticagrelor (BRILINTA)  90 MG TABS tablet Take 1 tablet (90 mg total) by mouth 2 (two) times daily. 60 tablet 10   No current facility-administered medications for this visit.     Allergies:   Patient has no known allergies.    Social History:  The patient  reports that she has never smoked. She has never used smokeless tobacco. She reports that she does not drink alcohol or use drugs.   Family History:  The patient's family history includes Healthy in her brother, sister, sister, sister, son, son, and son; Heart disease in her mother; Lung cancer in her sister; Unexplained death in her father.    ROS:  Please see the history of present illness.   Otherwise, review of systems are positive for fatigue.   All other systems are reviewed and negative.    PHYSICAL EXAM: VS:  BP (!) 178/68   Pulse 68   Ht 4\' 9"  (1.448 m)   Wt 71.3 kg (157 lb 3.2 oz)   LMP 10/20/1991   BMI 34.02 kg/m  , BMI Body mass index is 34.02 kg/m. GENERAL:  Well appearing HEENT:  Pupils equal round and reactive, fundi not visualized, oral mucosa unremarkable NECK:  No jugular venous distention, waveform within normal limits, carotid upstroke brisk and symmetric, no bruits LYMPHATICS:  No cervical adenopathy LUNGS:  Clear to auscultation bilaterally HEART:  RRR.  PMI not displaced or sustained,S1 and S2 within normal limits, no S3, no S4, no clicks, no rubs, II/VI, early-peaking systolic murmur at the RUSB ABD:  Flat, positive bowel sounds normal in frequency in pitch, no bruits, no rebound, no guarding, no midline pulsatile mass, no hepatomegaly, no splenomegaly EXT:  2 plus pulses throughout, no edema, no cyanosis no clubbing SKIN:  No rashes no nodules NEURO:  Cranial nerves II through XII grossly intact, motor grossly intact throughout PSYCH:  Cognitively intact, oriented to person place and time   EKG:  EKG is ordered today. The ekg ordered 01/26/16 demonstrates sinus rhythm rate 73 bpm.  Incomplete RBBB.  08/16/16: sinus rhythm.   Rate 68 bpm.   Echo 01/19/16: Study Conclusions  - Left ventricle: The cavity size was normal. Wall thickness was  normal. Systolic function was normal. The estimated ejection  fraction was in the range of 55% to 60%. Wall motion was normal;  there were no regional wall motion abnormalities. Doppler  parameters are consistent with abnormal left ventricular  relaxation (grade 1 diastolic dysfunction). - Left atrium: The atrium was mildly dilated. - Pericardium, extracardiac: A trivial pericardial effusion was  identified.  LHC 01/06/16:  Angiographic Findings:  1. The left main coronary artery bifurcates in the usual fashion into the left anterior descending artery and left circumflex coronary artery. The vessel is moderately calcified. There is a 25-30% stenosis in the distal left main artery. 2. The left anterior descending artery is a large vessel that reaches the apex and generates 2 major diagonal branches. The proximal vessel is moderately calcified. There is an  ulcerated proximal LAD stenosis up to 90% in severity 3. The left circumflex coronary artery is a medium-size vessel non dominant vessel that generates two major oblique marginal arteries. There is a 90-95% stenosis in the first OM branch, but this vessel is well under 2 mm in caliber. 4. The right coronary artery is a medium-size dominant vessel that generates a single branch posterior lateral ventricular system as well as the PDA. There is evidence of minima luminal irregularities and mild calcification. No hemodynamically meaningful stenoses are seen.  5. The left ventricle is normal in size. The left ventricle systolic function is normal with an estimated ejection fraction of 60-65%. Regional wall motion abnormalities are not seen. No left ventricular thrombus is seen. There is no mitral insufficiency. The ascending aorta appears normal. There is no aortic valve stenosis by pullback. The left ventricular end-diastolic  pressure is normal.    Recent Labs: 01/07/2016: Hemoglobin 12.3; Platelets 341 06/29/2016: TSH 0.410 08/16/2016: ALT 65; BUN 9; Creat 0.84; Potassium 4.5; Sodium 141    Lipid Panel    Component Value Date/Time   CHOL 99 08/16/2016 0859   CHOL 199 12/14/2015 1420   TRIG 104 08/16/2016 0859   HDL 36 (L) 08/16/2016 0859   HDL 41 12/14/2015 1420   CHOLHDL 2.8 08/16/2016 0859   VLDL 21 08/16/2016 0859   LDLCALC 42 08/16/2016 0859   LDLCALC 119 (H) 12/14/2015 1420      Wt Readings from Last 3 Encounters:  11/21/16 71.3 kg (157 lb 3.2 oz)  08/16/16 68.3 kg (150 lb 9.6 oz)  06/29/16 68.9 kg (151 lb 12.8 oz)      ASSESSMENT PLAN:  # CAD s/p LAD PCI: Vickie [name] is doing well.  She denies exertional chest pain. Continue nebivolol, Imdur, atorvastatin, aspirin and ticagrelor. She will need DAPT through at least 12/2016.  # Hypertension: BP is above goal and has been high at home as well.  Continue nebivolol and Imdur. We will increase lisinopril to 20 mg daily. I've asked her to keep a log of her blood pressures at home  # Hyperlipidemia:  LDL responded well to atorvastatin. However she developed elevated liver enzymes. We will repeat her LFTs today. If they have normalized we will start rosuvastatin. If she has recurrent transaminitis we will need to switch to a PCSK9 inhibitor.   Current medicines are reviewed at length with the patient today.  The patient does not have concerns regarding medicines.  The following changes have been made:  Increase lisinopril to 20 mg  Labs/ tests ordered today include:   No orders of the defined types were placed in this encounter.    Disposition:   FU with Anterio Scheel C. Duke Salvia, MD, [facility] in 1 month.   This note was written with the assistance of speech recognition software.  Please excuse any transcriptional errors.  Signed, Aren Pryde C. Duke Salvia, MD, [facility]  11/21/2016 9:03 AM    [facility]

## 2016-11-21 ENCOUNTER — Encounter: Payer: Self-pay | Admitting: Cardiovascular Disease

## 2016-11-21 ENCOUNTER — Ambulatory Visit (INDEPENDENT_AMBULATORY_CARE_PROVIDER_SITE_OTHER): Payer: Medicare Other | Admitting: Cardiovascular Disease

## 2016-11-21 VITALS — BP 178/68 | HR 68 | Ht <= 58 in | Wt 157.2 lb

## 2016-11-21 DIAGNOSIS — Z9861 Coronary angioplasty status: Secondary | ICD-10-CM

## 2016-11-21 DIAGNOSIS — Z955 Presence of coronary angioplasty implant and graft: Secondary | ICD-10-CM

## 2016-11-21 DIAGNOSIS — I10 Essential (primary) hypertension: Secondary | ICD-10-CM | POA: Diagnosis not present

## 2016-11-21 DIAGNOSIS — R748 Abnormal levels of other serum enzymes: Secondary | ICD-10-CM

## 2016-11-21 LAB — HEPATIC FUNCTION PANEL
ALK PHOS: 94 U/L (ref 33–130)
ALT: 15 U/L (ref 6–29)
AST: 19 U/L (ref 10–35)
Albumin: 4 g/dL (ref 3.6–5.1)
BILIRUBIN DIRECT: 0.1 mg/dL (ref <=0.2)
BILIRUBIN TOTAL: 0.4 mg/dL (ref 0.2–1.2)
Indirect Bilirubin: 0.3 mg/dL (ref 0.2–1.2)
Total Protein: 6.7 g/dL (ref 6.1–8.1)

## 2016-11-21 MED ORDER — LISINOPRIL 20 MG PO TABS: 20.0000 mg | ORAL_TABLET | Freq: Every day | ORAL | 3 refills | Status: DC

## 2016-11-21 NOTE — Patient Instructions (Addendum)
Medication Instructions:  INCREASE YOUR LISINOPRIL TO 20 MG DAILY   Labwork: LFT'S AT [facility] ON THE FIRST FLOOR  Testing/Procedures: NONE  Follow-Up: Your physician recommends that you schedule a follow-up appointment in: 1 MONTH OV  MONITOR YOUR BLOOD PRESSURE AT HOME AND BRING WITH YOU TO YOUR FOLLOW UP VISIT   If you need a refill on your cardiac medications before your next appointment, please call your pharmacy.

## 2016-11-23 ENCOUNTER — Other Ambulatory Visit: Payer: Self-pay | Admitting: Adult Health

## 2016-11-23 ENCOUNTER — Encounter: Payer: Self-pay | Admitting: *Deleted

## 2016-11-23 ENCOUNTER — Other Ambulatory Visit: Payer: Self-pay | Admitting: Cardiovascular Disease

## 2016-11-23 NOTE — Telephone Encounter (Signed)
Advised patient of lab results   This encounter was created in error - please disregard.

## 2016-11-23 NOTE — Telephone Encounter (Signed)
Rx request sent to pharmacy.  

## 2016-11-23 NOTE — Telephone Encounter (Signed)
Please review for refill. Thanks!  

## 2016-11-23 NOTE — Telephone Encounter (Signed)
-----   Message from Chilton Si, MD sent at 11/23/2016  4:07 PM EDT ----- LFTs have normalized. Start rosuvastatin 20 mg. Check lipids and LFTs in 6 weeks.

## 2016-12-11 ENCOUNTER — Other Ambulatory Visit: Payer: Self-pay

## 2016-12-11 ENCOUNTER — Other Ambulatory Visit: Payer: Self-pay | Admitting: Cardiovascular Disease

## 2016-12-11 MED ORDER — LISINOPRIL 20 MG PO TABS: 20.0000 mg | ORAL_TABLET | Freq: Every day | ORAL | 3 refills | Status: DC

## 2016-12-11 NOTE — Telephone Encounter (Signed)
Please review for refill. Thanks!  

## 2016-12-21 ENCOUNTER — Encounter: Payer: Self-pay | Admitting: Cardiovascular Disease

## 2016-12-21 ENCOUNTER — Ambulatory Visit (INDEPENDENT_AMBULATORY_CARE_PROVIDER_SITE_OTHER): Payer: Medicare Other | Admitting: Cardiovascular Disease

## 2016-12-21 VITALS — BP 138/74 | HR 71 | Ht <= 58 in | Wt 154.6 lb

## 2016-12-21 DIAGNOSIS — I119 Hypertensive heart disease without heart failure: Secondary | ICD-10-CM | POA: Diagnosis not present

## 2016-12-21 DIAGNOSIS — E7800 Pure hypercholesterolemia, unspecified: Secondary | ICD-10-CM

## 2016-12-21 DIAGNOSIS — Z79899 Other long term (current) drug therapy: Secondary | ICD-10-CM | POA: Diagnosis not present

## 2016-12-21 DIAGNOSIS — E7849 Other hyperlipidemia: Secondary | ICD-10-CM

## 2016-12-21 DIAGNOSIS — Z955 Presence of coronary angioplasty implant and graft: Secondary | ICD-10-CM

## 2016-12-21 DIAGNOSIS — I10 Essential (primary) hypertension: Secondary | ICD-10-CM | POA: Diagnosis not present

## 2016-12-21 DIAGNOSIS — E784 Other hyperlipidemia: Secondary | ICD-10-CM | POA: Diagnosis not present

## 2016-12-21 DIAGNOSIS — Z9861 Coronary angioplasty status: Secondary | ICD-10-CM | POA: Diagnosis not present

## 2016-12-21 MED ORDER — ROSUVASTATIN CALCIUM 20 MG PO TABS: 20.0000 mg | ORAL_TABLET | Freq: Every day | ORAL | 1 refills | Status: DC

## 2016-12-21 NOTE — Progress Notes (Signed)
Cardiology Office Note   Date:  12/21/2016   ID:  MARIAMAWIT DEPAOLI, DO 1941/06/17, MRN [id number]  PCP:  Rocco Serene, MD  Cardiologist:   Chilton Si, MD   Chief Complaint  Patient presents with  . Follow-up    pt c/o weakness and SOB, since increase of her Lisinopril     History of Present Illness: Vickie [name] is a 76 y.o. female with hypertension, hypothyroidism, GERD and hyperlipidemia who presents for follow up.  Vickie [name] reported intermittent chest tightness to her PCP, Dr. Boykin Peek, MD.  She was started on aspirin and Protonix and  evaluated in cardiology clinic on 01/04/16.  She underwent cardiac catheterization on 01/06/16 where she was found to have an ulcerated 90% proximal LAD lesion and a 90-95% lesion in OM 1.  She underwent successful implantation of a drug-eluting stent in the LAD and the OM lesion was medically managed, as it was a small caliber vessel.  She was started on nebivolol as she has a history of fatigue on other beta blockers. An echocardiogram on 01/19/16 revealed LVEF 55-60% and grade 1 diastolic dysfunction.  She had recurrent chest discomfort so both nebivolol and Imdur were increased with improvement in her symptoms. Atorvastatin was discontinued due to transaminitis.  This resolved and it was recommended that she try rosuvastatin. At h+er last appointment lisinopril was increased due to poorly controlled blood pressure.  Since making that change her blood pressure has been in the 120s-140s/50-70s.  It is in the 130s-140s when she first sits down and then goes down to the 110s after sitting.  Since making that she is feeling weaker and more breathless.  She thinks this may be related to her thyroid.  She has also been feeling depressed and not exercised.  She is trying to get her house ready to move and feeling unsettled.  She also reports that she isn't sleeping well.     Past Medical History:  Diagnosis Date  . Chronic diastolic  heart failure (HCC) 11/26/270   Grade I by Echo 01/19/2016  . Chronic urticaria 03/16/2013  . Essential hypertension 08/14/2006  . Gastroesophageal reflux disease 07/03/2013   Symptoms are not significant enough that patient wants pharmacologic therapy at this point   . Hyperlipidemia LDL goal < 130 07/03/2013  . Hypothyroidism 08/14/2006  . Overweight (BMI 25.0-29.9) 07/03/2013    Past Surgical History:  Procedure Laterality Date  . CARDIAC CATHETERIZATION N/A 01/06/2016   Procedure: Left Heart Cath and Coronary Angiography;  Surgeon: Thurmon Fair, MD;  Location: [facility];  Service: Cardiovascular;  Laterality: N/A;  . CARDIAC CATHETERIZATION N/A 01/06/2016   Procedure: Coronary Stent Intervention;  Surgeon: Lennette Bihari, MD;  Location: [facility];  Service: Cardiovascular;  Laterality: N/A;  . CHOLECYSTECTOMY    . TUBAL LIGATION      Current Outpatient Prescriptions  Medication Sig Dispense Refill  . acetaminophen (TYLENOL) 500 MG tablet Take 1,000 mg by mouth at bedtime as needed for mild pain. Reported on 01/20/2016    . aspirin 81 MG tablet Take 1 tablet (81 mg total) by mouth daily. 30 tablet 6  . BRILINTA 90 MG TABS tablet TAKE 1 TABLET BY MOUTH TWICE A DAY 60 tablet 10  . isosorbide mononitrate (IMDUR) 60 MG 24 hr tablet TAKE 1 TABLET (60 MG TOTAL) BY MOUTH DAILY. KEEP OV. 30 tablet 6  . levothyroxine (SYNTHROID, LEVOTHROID) 88 MCG tablet Take 1 tablet (88 mcg total)  by mouth daily before breakfast. 90 tablet 3  . lisinopril (PRINIVIL,ZESTRIL) 20 MG tablet Take 1 tablet (20 mg total) by mouth daily. 90 tablet 3  . nebivolol (BYSTOLIC) 10 MG tablet Take 1 tablet (10 mg total) by mouth daily. 30 tablet 5  . nitroGLYCERIN (NITROSTAT) 0.4 MG SL tablet Place 1 tablet (0.4 mg total) under the tongue every 5 (five) minutes as needed for chest pain. 90 tablet 3  . ranitidine (ZANTAC) 300 MG tablet Take 1 tablet (300 mg total) by mouth daily as needed for heartburn. 90  tablet 3  . rosuvastatin (CRESTOR) 20 MG tablet Take 1 tablet (20 mg total) by mouth daily. 90 tablet 1   No current facility-administered medications for this visit.     Allergies:   Patient has no known allergies.    Social History:  The patient  reports that she has never smoked. She has never used smokeless tobacco. She reports that she does not drink alcohol or use drugs.   Family History:  The patient's family history includes Healthy in her brother, sister, sister, sister, son, son, and son; Heart disease in her mother; Lung cancer in her sister; Unexplained death in her father.    ROS:  Please see the history of present illness.   Otherwise, review of systems are positive for fatigue.   All other systems are reviewed and negative.    PHYSICAL EXAM: VS:  BP 138/74   Pulse 71   Ht  (1.448 m)   Wt 70.1 kg (154 lb 9.6 oz)   LMP 10/20/1991   BMI 33.46 kg/m  , BMI Body mass index is 33.46 kg/m. GENERAL:  Well appearing.  No acute distress HEENT:  Pupils equal round and reactive, fundi not visualized, oral mucosa unremarkable NECK:  No jugular venous distention, waveform within normal limits, carotid upstroke brisk and symmetric, no bruits LYMPHATICS:  No cervical adenopathy LUNGS:  Clear to auscultation bilaterally.  No crackles, rhonchi or wheezes HEART:  RRR.  PMI not displaced or sustained,S1 and S2 within normal limits, no S3, no S4, no clicks, no rubs, II/VI, early-peaking systolic murmur at the RUSB ABD:  Flat, positive bowel sounds normal in frequency in pitch, no bruits, no rebound, no guarding, no midline pulsatile mass, no hepatomegaly, no splenomegaly EXT:  2 plus pulses throughout, no edema, no cyanosis no clubbing SKIN:  No rashes no nodules NEURO:  Cranial nerves II through XII grossly intact, motor grossly intact throughout PSYCH:  Cognitively intact, oriented to person place and time   EKG:  EKG is not ordered today. The ekg ordered 01/26/16 demonstrates  sinus rhythm rate 73 bpm.  Incomplete RBBB.  08/16/16: sinus rhythm.  Rate 68 bpm.   Echo 01/19/16: Study Conclusions  - Left ventricle: The cavity size was normal. Wall thickness was  normal. Systolic function was normal. The estimated ejection  fraction was in the range of 55% to 60%. Wall motion was normal;  there were no regional wall motion abnormalities. Doppler  parameters are consistent with abnormal left ventricular  relaxation (grade 1 diastolic dysfunction). - Left atrium: The atrium was mildly dilated. - Pericardium, extracardiac: A trivial pericardial effusion was  identified.  LHC 01/06/16:  Angiographic Findings:  1. The left main coronary artery bifurcates in the usual fashion into the left anterior descending artery and left circumflex coronary artery. The vessel is moderately calcified. There is a 25-30% stenosis in the distal left main artery. 2. The left anterior descending artery is  a large vessel that reaches the apex and generates 2 major diagonal branches. The proximal vessel is moderately calcified. There is an ulcerated proximal LAD stenosis up to 90% in severity 3. The left circumflex coronary artery is a medium-size vessel non dominant vessel that generates two major oblique marginal arteries. There is a 90-95% stenosis in the first OM branch, but this vessel is well under 2 mm in caliber. 4. The right coronary artery is a medium-size dominant vessel that generates a single branch posterior lateral ventricular system as well as the PDA. There is evidence of minima luminal irregularities and mild calcification. No hemodynamically meaningful stenoses are seen.  5. The left ventricle is normal in size. The left ventricle systolic function is normal with an estimated ejection fraction of 60-65%. Regional wall motion abnormalities are not seen. No left ventricular thrombus is seen. There is no mitral insufficiency. The ascending aorta appears normal. There is no  aortic valve stenosis by pullback. The left ventricular end-diastolic pressure is normal.    Recent Labs: 01/07/2016: Hemoglobin 12.3; Platelets 341 06/29/2016: TSH 0.410 08/16/2016: BUN 9; Creat 0.84; Potassium 4.5; Sodium 141 11/21/2016: ALT 15    Lipid Panel    Component Value Date/Time   CHOL 99 08/16/2016 0859   CHOL 199 12/14/2015 1420   TRIG 104 08/16/2016 0859   HDL 36 (L) 08/16/2016 0859   HDL 41 12/14/2015 1420   CHOLHDL 2.8 08/16/2016 0859   VLDL 21 08/16/2016 0859   LDLCALC 42 08/16/2016 0859   LDLCALC 119 (H) 12/14/2015 1420      Wt Readings from Last 3 Encounters:  12/21/16 70.1 kg (154 lb 9.6 oz)  11/21/16 71.3 kg (157 lb 3.2 oz)  08/16/16 68.3 kg (150 lb 9.6 oz)      ASSESSMENT PLAN:  # CAD s/p LAD PCI: Vickie [name] is doing well.  She denies exertional chest pain. Continue nebivolol, Imdur, atorvastatin, aspirin and ticagrelor. She will need DAPT through at least 12/2016.  # Hypertension: BP is above goal today but has been controlled at home.  We discussed the importance of limiting salt.  She will continue to monitor her blood pressure.  Continue nebivolol, lisinoprill, and Imdur.    # Hyperlipidemia:  LDL responded well to atorvastatin. However she developed elevated liver enzymes.  This improved so we will try rosuvastatin 20 mg daily.  Check lipids and CMP in 6 weeks.   Current medicines are reviewed at length with the patient today.  The patient does not have concerns regarding medicines.   The following changes have been made: rosuvastatin 20 mg  Labs/ tests ordered today include:   Orders Placed This Encounter  Procedures  . Lipid panel  . Comprehensive metabolic panel     Disposition:   FU with Vickie Marines C. Duke Salvia, MD, [facility] in 4 months.   This note was written with the assistance of speech recognition software.  Please excuse any transcriptional errors.  Signed, Vickie Hellmer C. Duke Salvia, MD, [facility]  12/21/2016 8:25 AM    [facility]

## 2016-12-21 NOTE — Patient Instructions (Signed)
Medication Instructions:  START ROSUVASTATIN 20 MG DAILY   Labwork: FASTING LP/CMET AT YOUR PRIMARY CARE IN 6 WEEKS  Testing/Procedures: NONE  Follow-Up: Your physician recommends that you schedule a follow-up appointment in: 4 MONTH OV  If you need a refill on your cardiac medications before your next appointment, please call your pharmacy.

## 2017-01-22 ENCOUNTER — Other Ambulatory Visit: Payer: Self-pay | Admitting: Internal Medicine

## 2017-01-22 DIAGNOSIS — I10 Essential (primary) hypertension: Secondary | ICD-10-CM

## 2017-01-22 DIAGNOSIS — K219 Gastro-esophageal reflux disease without esophagitis: Secondary | ICD-10-CM

## 2017-02-05 ENCOUNTER — Other Ambulatory Visit: Payer: Self-pay | Admitting: *Deleted

## 2017-02-05 DIAGNOSIS — E785 Hyperlipidemia, unspecified: Secondary | ICD-10-CM

## 2017-02-05 DIAGNOSIS — Z79899 Other long term (current) drug therapy: Secondary | ICD-10-CM

## 2017-02-05 LAB — LIPID PANEL
Chol/HDL Ratio: 2.9 ratio (ref 0.0–4.4)
Cholesterol, Total: 97 mg/dL — ABNORMAL LOW (ref 100–199)
HDL: 33 mg/dL — ABNORMAL LOW (ref >39)
LDL Calculated: 31 mg/dL (ref 0–99)
Triglycerides: 166 mg/dL — ABNORMAL HIGH (ref 0–149)
VLDL Cholesterol Cal: 33 mg/dL (ref 5–40)

## 2017-02-05 LAB — COMPREHENSIVE METABOLIC PANEL
ALBUMIN: 4.4 g/dL (ref 3.5–4.8)
ALK PHOS: 75 IU/L (ref 39–117)
ALT: 19 IU/L (ref 0–32)
AST: 22 IU/L (ref 0–40)
Albumin/Globulin Ratio: 1.8 (ref 1.2–2.2)
BILIRUBIN TOTAL: 0.3 mg/dL (ref 0.0–1.2)
BUN / CREAT RATIO: 11 — AB (ref 12–28)
BUN: 10 mg/dL (ref 8–27)
CO2: 22 mmol/L (ref 20–29)
Calcium: 10.1 mg/dL (ref 8.7–10.3)
Chloride: 104 mmol/L (ref 96–106)
Creatinine, Ser: 0.87 mg/dL (ref 0.57–1.00)
GFR calc Af Amer: 76 mL/min/{1.73_m2} (ref >59)
GFR calc non Af Amer: 66 mL/min/{1.73_m2} (ref >59)
GLOBULIN, TOTAL: 2.4 g/dL (ref 1.5–4.5)
GLUCOSE: 104 mg/dL — AB (ref 65–99)
POTASSIUM: 5.2 mmol/L (ref 3.5–5.2)
SODIUM: 140 mmol/L (ref 134–144)
Total Protein: 6.8 g/dL (ref 6.0–8.5)

## 2017-02-15 ENCOUNTER — Telehealth: Payer: Self-pay | Admitting: *Deleted

## 2017-02-15 NOTE — Telephone Encounter (Signed)
Advised patient of lab results and that Dr [name] reviewed blood pressure readings   Chilton Siandolph, Tiffany, MD  Regis BillPratt, Queena Monrreal B        Blood pressures that she brought in were reviewed. It looks like her BP is controlled on lisinopril 10 mg so it is OK to continue that dose. Red yeast rice has the same active ingredients as statins and can also cause muscle aches. It is better to take statins that are actually regulated by the FDA. If she has intolerance to statins there are other meds we can try with proved mortality benefit.

## 2017-02-15 NOTE — Telephone Encounter (Signed)
-----   Message from Chilton Siiffany [place], MD sent at 02/11/2017  5:27 PM EDT ----- Cholesterol levels are much better and her liver function is stable. No changes.

## 2017-02-25 ENCOUNTER — Ambulatory Visit (INDEPENDENT_AMBULATORY_CARE_PROVIDER_SITE_OTHER): Payer: Medicare Other | Admitting: Internal Medicine

## 2017-02-25 VITALS — BP 150/78 | HR 75 | Temp 97.8°F | Ht 60.0 in | Wt 151.8 lb

## 2017-02-25 DIAGNOSIS — Z5181 Encounter for therapeutic drug level monitoring: Secondary | ICD-10-CM | POA: Diagnosis not present

## 2017-02-25 DIAGNOSIS — T466X5A Adverse effect of antihyperlipidemic and antiarteriosclerotic drugs, initial encounter: Secondary | ICD-10-CM | POA: Diagnosis not present

## 2017-02-25 DIAGNOSIS — K5903 Drug induced constipation: Secondary | ICD-10-CM | POA: Diagnosis not present

## 2017-02-25 MED ORDER — POLYETHYLENE GLYCOL 3350 17 G PO PACK: 17.0000 g | PACK | Freq: Every day | ORAL | 0 refills | Status: DC

## 2017-02-25 NOTE — Progress Notes (Signed)
° °  CC: Abdominal discomfort   HPI:  [name] is a 76 y.o. F with hx of CAD, HTN, diastolic CHF, hypothyroidism, and GERD here with complaints of right sided abdominal discomfort.   She states her sxs started in mid-May, 2-3 weeks after starting rosuvastatin 20mg . The pain is described as discomfort and has been accompanied by changes in BM size and character. Her BMs have remained about daily, but have decreased in size with some occasions of hard, ball-like stool. She denies overt abdominal pain, changes in appetite, nausea, vomiting, blood in stool, fever, or other changes. About 3 weeks ago, her sx were relieved by taking prune juice which increased BM, diarrhea, and flatulence temporarily, she has since taken Miralax inconsistently with little relief and persistent discomfort.     Past Medical History:  Diagnosis Date   Chronic diastolic heart failure (HCC) 01/20/2016   Grade I by Echo 01/19/2016   Chronic urticaria 03/16/2013   Essential hypertension 08/14/2006   Gastroesophageal reflux disease 07/03/2013   Symptoms are not significant enough that patient wants pharmacologic therapy at this point    Hyperlipidemia LDL goal < 130 07/03/2013   Hypothyroidism 08/14/2006   Overweight (BMI 25.0-29.9) 07/03/2013   Review of Systems:  Review of Systems  Constitutional: Negative for fever and weight loss.  Gastrointestinal: Positive for abdominal pain (described as discomfort) and constipation. Negative for blood in stool, melena, nausea and vomiting.  Genitourinary: Negative for dysuria, frequency and urgency.    Physical Exam:  Vitals:   02/25/17 0859 02/25/17 0958  BP: (!) 159/72 (!) 150/78  Pulse: 75   Temp: 97.8 F (36.6 C)   TempSrc: Oral   SpO2: 99%   Weight: 151 lb 12.8 oz (68.9 kg)   Height: 5' (1.524 m)    Physical Exam  Constitutional: She is oriented to person, place, and time. She appears well-developed and well-nourished.  Cardiovascular: Normal rate,  regular rhythm and intact distal pulses.   Pulmonary/Chest: Effort normal and breath sounds normal. She has no wheezes.  Abdominal: Soft. Bowel sounds are normal. She exhibits no distension. There is no tenderness. There is no rebound and no guarding.  Well-healed incisional scars in RUQ, suprapubic from remote surgery   Neurological: She is alert and oriented to person, place, and time.  Skin: Skin is warm and dry. Capillary refill takes less than 2 seconds.  Psychiatric: She has a normal mood and affect.    Assessment & Plan:   See Encounters Tab for problem based charting.  Patient discussed with Dr. Criselda PeachesMullen.

## 2017-02-25 NOTE — Assessment & Plan Note (Addendum)
Drug Induced Constipation  Assessment:  Onset of changes in BM quantity and character correspond to initiation of rosuvastatin 20 mg with constipation being a known side effect (3-5%). Since initial relief with prune juice, she has not had a consistent bowel regimen and has experienced continued sx. No signs or sx for more concerning etiologies of discomfort. Pt was encouraged to attempt bowel regimen and stay on a statin given her cardiac history and potential benefit. Will check LFTs given recent history of transaminitis due to atorvastatin.   Plan: -Polyethylene glycol 17 g packet daily -CMP to assess liver enzymes -F/u in 4 weeks to re-assess

## 2017-02-25 NOTE — Patient Instructions (Signed)
It was very nice to meet you Mrs. Vickie [name].   We will check some labs for your liver to be sure that is not a problem with your Crestor. We will call if results are abnormal   For your constipation, we will try 1 packet (17 g) of Miralax each day at breakfast to help your colon recover and relieve your discomfort. Get this over the counter, wherever is most convenient or cheapest  We will see you back in 4 weeks to see how this is working and continue your Crestor in the meantime. Thank you

## 2017-02-26 LAB — CMP14 + ANION GAP
ALK PHOS: 73 IU/L (ref 39–117)
ALT: 19 IU/L (ref 0–32)
ANION GAP: 15 mmol/L (ref 10.0–18.0)
AST: 22 IU/L (ref 0–40)
Albumin/Globulin Ratio: 1.9 (ref 1.2–2.2)
Albumin: 4.5 g/dL (ref 3.5–4.8)
BILIRUBIN TOTAL: 0.3 mg/dL (ref 0.0–1.2)
BUN / CREAT RATIO: 12 (ref 12–28)
BUN: 9 mg/dL (ref 8–27)
CHLORIDE: 106 mmol/L (ref 96–106)
CO2: 22 mmol/L (ref 20–29)
Calcium: 9.4 mg/dL (ref 8.7–10.3)
Creatinine, Ser: 0.74 mg/dL (ref 0.57–1.00)
GFR calc Af Amer: 92 mL/min/{1.73_m2} (ref >59)
GFR calc non Af Amer: 80 mL/min/{1.73_m2} (ref >59)
GLUCOSE: 95 mg/dL (ref 65–99)
Globulin, Total: 2.4 g/dL (ref 1.5–4.5)
Potassium: 4.5 mmol/L (ref 3.5–5.2)
Sodium: 143 mmol/L (ref 134–144)
Total Protein: 6.9 g/dL (ref 6.0–8.5)

## 2017-02-26 NOTE — Progress Notes (Signed)
Internal Medicine Clinic Attending  Case discussed with Dr. Anthonette LegatoHarden at the time of the visit.  We reviewed the resident's history and exam and pertinent patient test results.  I agree with the assessment, diagnosis, and plan of care documented in the resident's note.  We further discussed her desire to go off of statin therapy.  Her AST/ALT were appropriate and given her history, we recommended that she remain on statin therapy at this time.  If her abdominal pain and constipation resolve with a good bowel regimen, she may remain on current therapy.  Or, if she feels this is not working well for her, we can try a different statin in the future.

## 2017-04-23 NOTE — Progress Notes (Signed)
Cardiology Office Note   Date:  04/24/2017   ID:  Vickie HAUSNER, DO March 31, 1941, MRN [id number]  PCP:  Doneen Poisson, MD  Cardiologist:   Chilton Si, MD   No chief complaint on file.    History of Present Illness: Vickie [name] is a 76 y.o. female with hypertension, white coat hypertension, hypothyroidism, GERD and hyperlipidemia who presents for follow up.  Vickie [name] reported intermittent chest tightness to her PCP, Dr. Boykin Peek, MD.  She was started on aspirin and Protonix and  evaluated in cardiology clinic on 01/04/16.  She underwent cardiac catheterization on 01/06/16 where she was found to have an ulcerated 90% proximal LAD lesion and a 90-95% lesion in OM 1.  She underwent successful implantation of a drug-eluting stent in the LAD and the OM lesion was medically managed, as it was a small caliber vessel.  She was started on nebivolol as she has a history of fatigue on other beta blockers. An echocardiogram on 01/19/16 revealed LVEF 55-60% and grade 1 diastolic dysfunction.  She had recurrent chest discomfort so both nebivolol and Imdur were increased with improvement in her symptoms. Atorvastatin was discontinued due to transaminitis.  At her last appointment this was switched to rosuvastatin.  Since her last appointment she stopped rosuvastatin due to drug induced constipation.  He also developed R flank pain.  She isn't willing to try any other statins.  Her bowels are just now getting back to normal.  SHe wants to try red yeast rice instead.  She hasn't experienced any chest pain or shortness of breath.  She also denies lower extremity edema, orthopnea or PND. Vickie [name] has been checking her BP at home and it has been running 92-117/60-70s. She reduced lisinopril from 20mg  to 10mg  because her BP was previously running even lower.  She hasn't been exercising because she has still been busy preparing to move.  She also can't exercise because her hip locks.      Past Medical History:  Diagnosis Date  . Chronic diastolic heart failure (HCC) 01/20/2016   Grade I by Echo 01/19/2016  . Chronic urticaria 03/16/2013  . Essential hypertension 08/14/2006  . Gastroesophageal reflux disease 07/03/2013   Symptoms are not significant enough that patient wants pharmacologic therapy at this point   . Hyperlipidemia LDL goal < 130 07/03/2013  . Hypothyroidism 08/14/2006  . Overweight (BMI 25.0-29.9) 07/03/2013    Past Surgical History:  Procedure Laterality Date  . CARDIAC CATHETERIZATION N/A 01/06/2016   Procedure: Left Heart Cath and Coronary Angiography;  Surgeon: Thurmon Fair, MD;  Location: [facility];  Service: Cardiovascular;  Laterality: N/A;  . CARDIAC CATHETERIZATION N/A 01/06/2016   Procedure: Coronary Stent Intervention;  Surgeon: Lennette Bihari, MD;  Location: [facility];  Service: Cardiovascular;  Laterality: N/A;  . CHOLECYSTECTOMY    . TUBAL LIGATION      Current Outpatient Prescriptions  Medication Sig Dispense Refill  . acetaminophen (TYLENOL) 500 MG tablet Take 1,000 mg by mouth at bedtime as needed for mild pain. Reported on 01/20/2016    . aspirin 81 MG tablet Take 1 tablet (81 mg total) by mouth daily. 30 tablet 6  . isosorbide mononitrate (IMDUR) 60 MG 24 hr tablet TAKE 1 TABLET (60 MG TOTAL) BY MOUTH DAILY. KEEP OV. 30 tablet 6  . levothyroxine (SYNTHROID, LEVOTHROID) 88 MCG tablet Take 1 tablet (88 mcg total) by mouth daily before breakfast. 90 tablet 3  . lisinopril (PRINIVIL,ZESTRIL) 10  MG tablet Take 1 tablet (10 mg total) by mouth daily. 90 tablet 3  . nebivolol (BYSTOLIC) 10 MG tablet Take 1 tablet (10 mg total) by mouth daily. 90 tablet 3  . nitroGLYCERIN (NITROSTAT) 0.4 MG SL tablet Place 1 tablet (0.4 mg total) under the tongue every 5 (five) minutes as needed for chest pain. 90 tablet 3  . polyethylene glycol (MIRALAX / GLYCOLAX) packet Take 17 g by mouth daily. 14 each 0  . ranitidine (ZANTAC) 300 MG tablet  Take 1 tablet (300 mg total) by mouth daily as needed for heartburn. 90 tablet 3   No current facility-administered medications for this visit.     Allergies:   Atorvastatin and Rosuvastatin    Social History:  The patient  reports that she has never smoked. She has never used smokeless tobacco. She reports that she does not drink alcohol or use drugs.   Family History:  The patient's family history includes Healthy in her brother, sister, sister, sister, son, son, and son; Heart disease in her mother; Lung cancer in her sister; Unexplained death in her father.    ROS:  Please see the history of present illness.   Otherwise, review of systems are positive for constipation.   All other systems are reviewed and negative.    PHYSICAL EXAM: VS:  BP (!) 154/90   Pulse 73   Ht 5' (1.524 m)   Wt 67.8 kg (149 lb 6.4 oz)   LMP 10/20/1991   BMI 29.18 kg/m  , BMI Body mass index is 29.18 kg/m. GENERAL:  Well appearing.  No acute distress. HEENT: Pupils equal round and reactive, fundi not visualized, oral mucosa unremarkable NECK:  No jugular venous distention, waveform within normal limits, carotid upstroke brisk and symmetric, no bruits LUNGS:  Clear to auscultation bilaterally HEART:  RRR.  PMI not displaced or sustained,S1 and S2 within normal limits, no S3, no S4, no clicks, no rubs, II/VI, early-peaking systolic murmur at the RUSB murmurs ABD:  Flat, positive bowel sounds normal in frequency in pitch, no bruits, no rebound, no guarding, no midline pulsatile mass, no hepatomegaly, no splenomegaly EXT:  2 plus pulses throughout, no edema, no cyanosis no clubbing SKIN:  No rashes no nodules NEURO:  Cranial nerves II through XII grossly intact, motor grossly intact throughout PSYCH:  Cognitively intact, oriented to person place and time   EKG:  EKG is ordered today. The ekg ordered 01/26/16 demonstrates sinus rhythm rate 73 bpm.  Incomplete RBBB.  08/16/16: sinus rhythm.  Rate 68 bpm.   04/24/17: Sinus rhythm. Rate 73 bpm. PVC. Poor R wave progression. Low voltage.  Echo 01/19/16: Study Conclusions  - Left ventricle: The cavity size was normal. Wall thickness was  normal. Systolic function was normal. The estimated ejection  fraction was in the range of 55% to 60%. Wall motion was normal;  there were no regional wall motion abnormalities. Doppler  parameters are consistent with abnormal left ventricular  relaxation (grade 1 diastolic dysfunction). - Left atrium: The atrium was mildly dilated. - Pericardium, extracardiac: A trivial pericardial effusion was  identified.  LHC 01/06/16:  Angiographic Findings:  1. The left main coronary artery bifurcates in the usual fashion into the left anterior descending artery and left circumflex coronary artery. The vessel is moderately calcified. There is a 25-30% stenosis in the distal left main artery. 2. The left anterior descending artery is a large vessel that reaches the apex and generates 2 major diagonal branches. The proximal vessel  is moderately calcified. There is an ulcerated proximal LAD stenosis up to 90% in severity 3. The left circumflex coronary artery is a medium-size vessel non dominant vessel that generates two major oblique marginal arteries. There is a 90-95% stenosis in the first OM branch, but this vessel is well under 2 mm in caliber. 4. The right coronary artery is a medium-size dominant vessel that generates a single branch posterior lateral ventricular system as well as the PDA. There is evidence of minima luminal irregularities and mild calcification. No hemodynamically meaningful stenoses are seen.  5. The left ventricle is normal in size. The left ventricle systolic function is normal with an estimated ejection fraction of 60-65%. Regional wall motion abnormalities are not seen. No left ventricular thrombus is seen. There is no mitral insufficiency. The ascending aorta appears normal. There is no  aortic valve stenosis by pullback. The left ventricular end-diastolic pressure is normal.    Recent Labs: 06/29/2016: TSH 0.410 02/25/2017: ALT 19; BUN 9; Creatinine, Ser 0.74; Potassium 4.5; Sodium 143    Lipid Panel    Component Value Date/Time   CHOL 97 (L) 02/05/2017 1127   TRIG 166 (H) 02/05/2017 1127   HDL 33 (L) 02/05/2017 1127   CHOLHDL 2.9 02/05/2017 1127   CHOLHDL 2.8 08/16/2016 0859   VLDL 21 08/16/2016 0859   LDLCALC 31 02/05/2017 1127      Wt Readings from Last 3 Encounters:  04/24/17 67.8 kg (149 lb 6.4 oz)  02/25/17 68.9 kg (151 lb 12.8 oz)  12/21/16 70.1 kg (154 lb 9.6 oz)      ASSESSMENT PLAN:  # CAD s/p LAD PCI: Ms. Tousant is doing well.  She has not experienced any angina. It is been over a year since her PCI.  She does Still have a 99% OM lesion. We discussed the options of reducing ticagrelor to 60 mg daily or discontinuing it. She would rather stop ticagrelor.  Continue aspirin, nebivolol, Imdur, and atorvastatin.  # Hypertension: BP is above goal today but has been controlled at home.  Continue nebivolol, lisinoprill, and Imdur.  When we tried increasing lisinopril she developed hypotension.    # Hyperlipidemia:  LDL responded well to atorvastatin. However she developed elevated liver enzymes.  She developed constipation with rosuvastatin and is unwilling to try any other statins. She wants to try red yeast rice.  Repeat lipids and CMP in 6 weeks.   Current medicines are reviewed at length with the patient today.  The patient does not have concerns regarding medicines.   The following changes have been made: stop rosuvastatin   Labs/ tests ordered today include:   Orders Placed This Encounter  Procedures  . Lipid panel  . Comprehensive metabolic panel  . EKG 12-Lead     Disposition:   FU with Arlethia Basso C. Duke Salvia, MD, [facility] in 6 months.   This note was written with the assistance of speech recognition software.  Please excuse any  transcriptional errors.  Signed, Rhoderick Farrel C. Duke Salvia, MD, [facility]  04/24/2017 9:50 AM    [facility]

## 2017-04-24 ENCOUNTER — Ambulatory Visit (INDEPENDENT_AMBULATORY_CARE_PROVIDER_SITE_OTHER): Payer: Medicare Other | Admitting: Cardiovascular Disease

## 2017-04-24 ENCOUNTER — Encounter: Payer: Self-pay | Admitting: Cardiovascular Disease

## 2017-04-24 VITALS — BP 154/90 | HR 73 | Ht 60.0 in | Wt 149.4 lb

## 2017-04-24 DIAGNOSIS — E7800 Pure hypercholesterolemia, unspecified: Secondary | ICD-10-CM | POA: Diagnosis not present

## 2017-04-24 DIAGNOSIS — Z9861 Coronary angioplasty status: Secondary | ICD-10-CM | POA: Diagnosis not present

## 2017-04-24 DIAGNOSIS — Z955 Presence of coronary angioplasty implant and graft: Secondary | ICD-10-CM | POA: Diagnosis not present

## 2017-04-24 DIAGNOSIS — I119 Hypertensive heart disease without heart failure: Secondary | ICD-10-CM | POA: Diagnosis not present

## 2017-04-24 MED ORDER — LISINOPRIL 10 MG PO TABS: 10.0000 mg | ORAL_TABLET | Freq: Every day | ORAL | 3 refills | Status: DC

## 2017-04-24 NOTE — Patient Instructions (Addendum)
Medication Instructions:  STOP BRILINTA   Labwork: FASTING LP/CMET IN 2 MONTHS  Testing/Procedures: NONE  Follow-Up: Your physician wants you to follow-up in: 6 MONTH OV You will receive a reminder letter in the mail two months in advance. If you don't receive a letter, please call our office to schedule the follow-up appointment.  If you need a refill on your cardiac medications before your next appointment, please call your pharmacy.

## 2017-06-19 ENCOUNTER — Other Ambulatory Visit: Payer: Self-pay | Admitting: Cardiovascular Disease

## 2017-06-19 NOTE — Telephone Encounter (Signed)
Please review for refill, Thanks !  

## 2017-06-27 ENCOUNTER — Other Ambulatory Visit: Payer: Self-pay | Admitting: Internal Medicine

## 2017-06-27 DIAGNOSIS — E038 Other specified hypothyroidism: Secondary | ICD-10-CM

## 2017-07-04 DIAGNOSIS — I119 Hypertensive heart disease without heart failure: Secondary | ICD-10-CM | POA: Diagnosis not present

## 2017-07-04 DIAGNOSIS — Z955 Presence of coronary angioplasty implant and graft: Secondary | ICD-10-CM | POA: Diagnosis not present

## 2017-07-04 DIAGNOSIS — E7800 Pure hypercholesterolemia, unspecified: Secondary | ICD-10-CM | POA: Diagnosis not present

## 2017-07-04 LAB — COMPREHENSIVE METABOLIC PANEL
ALT: 13 IU/L (ref 0–32)
AST: 18 IU/L (ref 0–40)
Albumin/Globulin Ratio: 1.8 (ref 1.2–2.2)
Albumin: 4.4 g/dL (ref 3.5–4.8)
Alkaline Phosphatase: 72 IU/L (ref 39–117)
BUN/Creatinine Ratio: 11 — ABNORMAL LOW (ref 12–28)
BUN: 9 mg/dL (ref 8–27)
Bilirubin Total: 0.4 mg/dL (ref 0.0–1.2)
CALCIUM: 9.6 mg/dL (ref 8.7–10.3)
CO2: 25 mmol/L (ref 20–29)
CREATININE: 0.82 mg/dL (ref 0.57–1.00)
Chloride: 105 mmol/L (ref 96–106)
GFR, EST AFRICAN AMERICAN: 81 mL/min/{1.73_m2} (ref >59)
GFR, EST NON AFRICAN AMERICAN: 70 mL/min/{1.73_m2} (ref >59)
GLUCOSE: 91 mg/dL (ref 65–99)
Globulin, Total: 2.5 g/dL (ref 1.5–4.5)
POTASSIUM: 4.9 mmol/L (ref 3.5–5.2)
Sodium: 142 mmol/L (ref 134–144)
TOTAL PROTEIN: 6.9 g/dL (ref 6.0–8.5)

## 2017-07-04 LAB — LIPID PANEL
CHOL/HDL RATIO: 4 ratio (ref 0.0–4.4)
Cholesterol, Total: 169 mg/dL (ref 100–199)
HDL: 42 mg/dL (ref >39)
LDL CALC: 103 mg/dL — AB (ref 0–99)
TRIGLYCERIDES: 122 mg/dL (ref 0–149)
VLDL Cholesterol Cal: 24 mg/dL (ref 5–40)

## 2017-07-05 ENCOUNTER — Ambulatory Visit (INDEPENDENT_AMBULATORY_CARE_PROVIDER_SITE_OTHER): Payer: Medicare Other | Admitting: Internal Medicine

## 2017-07-05 ENCOUNTER — Encounter: Payer: Self-pay | Admitting: Internal Medicine

## 2017-07-05 VITALS — BP 150/67 | HR 64 | Wt 148.9 lb

## 2017-07-05 DIAGNOSIS — E038 Other specified hypothyroidism: Secondary | ICD-10-CM | POA: Diagnosis not present

## 2017-07-05 DIAGNOSIS — K219 Gastro-esophageal reflux disease without esophagitis: Secondary | ICD-10-CM

## 2017-07-05 DIAGNOSIS — E7800 Pure hypercholesterolemia, unspecified: Secondary | ICD-10-CM

## 2017-07-05 DIAGNOSIS — Z23 Encounter for immunization: Secondary | ICD-10-CM

## 2017-07-05 DIAGNOSIS — I10 Essential (primary) hypertension: Secondary | ICD-10-CM | POA: Diagnosis not present

## 2017-07-05 DIAGNOSIS — I25118 Atherosclerotic heart disease of native coronary artery with other forms of angina pectoris: Secondary | ICD-10-CM | POA: Diagnosis present

## 2017-07-05 DIAGNOSIS — E663 Overweight: Secondary | ICD-10-CM | POA: Diagnosis not present

## 2017-07-05 NOTE — Assessment & Plan Note (Signed)
Assessment  Her blood pressure today was 150/67. This is above target. She has not been checking her blood pressures at home recently. Her current regimen includes nebivolol 10 mg by mouth daily, lisinopril 10 mg by mouth daily, and isosorbide mononitrate 60 mg by mouth daily. She is not interested in escalating her lisinopril dose as she had low blood pressures on 20 mg by mouth daily previously.  Plan  We will continue the nebivolol 10 mg by mouth daily, lisinopril 10 mg by mouth daily, and isosorbide mononitrate 60 mg by mouth daily. She will take her blood pressure at home and record them for our review. If they remain within target no further escalation in her therapy will be required. If they are also elevated at home we will encourage her to consider increasing the lisinopril to 20 mg by mouth daily.

## 2017-07-05 NOTE — Progress Notes (Signed)
   Subjective:    Patient ID: Vickie [name], female    DOB: 1941/06/28, 76 y.o.   MRN: [id number]  HPI  Vickie [name] is here for follow-up of her coronary artery disease with stable angina, essential hypertension, hypothyroidism, hyperlipidemia, gastroesophageal reflux disease, and overweight status. Please see the A&P for the status of the pt's chronic medical problems.  She has one acute complaint which are fleeting shocklike pains that are bitemporal and are not associated with any particular activity or position. These pains last only 1-2 seconds and do not radiate down to the face. They are not associated with visual changes, photophobia, or phonophobia. There is also no associated numbness, tingling, or weakness in the arms and legs. She's had no recent trauma to the head. After the history and exam we both concluded it was likely related to some neck pain associated with how she lies in bed and watches TV. She will try to change how she does this to see if these fleeting pains resolve.  Review of Systems  Constitutional: Negative for activity change, appetite change and unexpected weight change.  Respiratory: Negative for chest tightness, shortness of breath and wheezing.   Cardiovascular: Negative for chest pain, palpitations and leg swelling.  Gastrointestinal: Positive for constipation. Negative for abdominal distention, abdominal pain, diarrhea, nausea and vomiting.  Musculoskeletal: Positive for neck pain and neck stiffness. Negative for arthralgias, back pain, gait problem, joint swelling and myalgias.  Skin: Negative for rash and wound.  Neurological: Positive for headaches. Negative for dizziness, facial asymmetry, weakness, light-headedness and numbness.      Objective:   Physical Exam  Constitutional: She is oriented to person, place, and time. She appears well-developed and well-nourished. No distress.  HENT:  Head: Normocephalic and atraumatic.  Eyes: Conjunctivae  are normal. Right eye exhibits no discharge. Left eye exhibits no discharge. No scleral icterus.  Neck: Normal range of motion. Neck supple. No thyromegaly present.  Cardiovascular: Normal rate and regular rhythm. Exam reveals no gallop and no friction rub.  Murmur heard. Pulmonary/Chest: Effort normal and breath sounds normal. No stridor. No respiratory distress. She has no wheezes. She has no rales.  Abdominal: Soft. Bowel sounds are normal. She exhibits no distension. There is no tenderness. There is no rebound and no guarding.  Musculoskeletal: Normal range of motion. She exhibits no edema, tenderness or deformity.  Neurological: She is alert and oriented to person, place, and time. She exhibits normal muscle tone.  Skin: Skin is warm and dry. No rash noted. She is not diaphoretic. No erythema.  Psychiatric: She has a normal mood and affect. Her behavior is normal. Judgment and thought content normal.  Nursing note and vitals reviewed.     Assessment & Plan:   Please see problem based charting.

## 2017-07-05 NOTE — Assessment & Plan Note (Signed)
Assessment  She denies any anginal pain and has not required any of her sublingual nitroglycerin. This is on nebivolol 10 mg by mouth daily, Imdur 60 mg by mouth daily, and aspirin 81 mg by mouth daily.  Plan  We will continue the nebivolol, Imdur, and aspirin at the current doses and reassess for anginal pain at the follow-up visit.

## 2017-07-05 NOTE — Assessment & Plan Note (Signed)
Assessment  She has not tolerated atorvastatin and rosuvastatin. She is not interested in additional statin therapy. Instead she has been taking red yeast rice. An LDL done yesterday was 103.  Plan  She will continue with the red yeast rice product. We will reassess her willingness to start statin therapy at the follow-up visit.

## 2017-07-05 NOTE — Patient Instructions (Addendum)
It was good to see you again.  I am sorry you have had some recent troubles.  Overall I think you are doing well.  1) Keep taking the medications as you are.  2) Watch how you are watching TV as this may be causing the neck pain and the head pains.  3) We checked your thyroid today.  I will call you next week when I get the results.  4) We will give you the flu shot today.  I will see you in 1 year, sooner if necessary.

## 2017-07-05 NOTE — Assessment & Plan Note (Signed)
Assessment  Her gastroesophageal reflux symptoms have responded well to the as needed ranitidine 300 mg by mouth daily.  Plan  We will continue the ranitidine 300 mg by mouth daily as needed for gastroesophageal reflux symptoms and reassess the efficacy of this therapy at the follow-up visit.

## 2017-07-05 NOTE — Assessment & Plan Note (Signed)
She was willing to receive the flu vaccination and this was given today. She is not interested in other preventative healthcare measures. We will reassess her willingness to consider other preventative healthcare interventions at the follow-up visit

## 2017-07-05 NOTE — Assessment & Plan Note (Signed)
Assessment  At the last clinic visit we decreased her Synthroid dose from 100 g daily to 88 g daily because her TSH was low. A TSH was drawn at this visit and is pending at the time of this dictation.  Plan  We will follow-up on the results of the TSH drawn today and make any further adjustments in her Synthroid dose. Until that time we will continue the 88 g daily.

## 2017-07-05 NOTE — Assessment & Plan Note (Signed)
Assessment  Her weight is down to 148 pounds. This is 1 pound less than 2 months ago. She was praised for her success in maintaining and slightly lowering her weight.  Plan  She plans to continue to work on weight loss via more activity. We will reassess the success at lowering her weight further at the follow-up visit.

## 2017-07-06 LAB — TSH: TSH: 0.654 u[IU]/mL (ref 0.450–4.500)

## 2017-07-09 NOTE — Progress Notes (Signed)
Patient ID: Erie NoeCarolyn M App, female   DOB: September 10, 1940, 76 y.o.   MRN: [id number]  TSH 0.654  TSH within appropriate range on synthroid 88 mcg daily.  We will continue this dose.  She was called with the results.  While she was on the phone she mentioned that she had been checking her blood pressure over the last several days and the following were the readings she got on the lisinopril 10 mg daily:  137/65 126/60 111/55 128/71 114/62 121/60  These blood pressures are at her target.  We will continue the lisinopril at 10 mg daily.

## 2018-01-18 ENCOUNTER — Other Ambulatory Visit: Payer: Self-pay | Admitting: Cardiovascular Disease

## 2018-01-18 ENCOUNTER — Other Ambulatory Visit: Payer: Self-pay | Admitting: Internal Medicine

## 2018-01-18 DIAGNOSIS — K219 Gastro-esophageal reflux disease without esophagitis: Secondary | ICD-10-CM

## 2018-01-18 DIAGNOSIS — I10 Essential (primary) hypertension: Secondary | ICD-10-CM

## 2018-01-21 NOTE — Telephone Encounter (Signed)
Rx sent to pharmacy   

## 2018-01-22 ENCOUNTER — Telehealth: Payer: Self-pay

## 2018-01-22 ENCOUNTER — Other Ambulatory Visit: Payer: Self-pay | Admitting: *Deleted

## 2018-01-22 DIAGNOSIS — K219 Gastro-esophageal reflux disease without esophagitis: Secondary | ICD-10-CM

## 2018-01-22 DIAGNOSIS — E038 Other specified hypothyroidism: Secondary | ICD-10-CM

## 2018-01-22 DIAGNOSIS — I10 Essential (primary) hypertension: Secondary | ICD-10-CM

## 2018-01-22 NOTE — Telephone Encounter (Signed)
[facility]ed [facility] moved pt profile from [address] to their store, pt informed

## 2018-01-22 NOTE — Telephone Encounter (Signed)
[facility] would [facility] [address] in [place].

## 2018-01-22 NOTE — Telephone Encounter (Signed)
[facility] [place] and ask [facility] to [facility] profile [facility] [address] to their store, they have done so, pt is good on refills

## 2018-02-12 ENCOUNTER — Other Ambulatory Visit: Payer: Self-pay | Admitting: Cardiovascular Disease

## 2018-02-12 DIAGNOSIS — I10 Essential (primary) hypertension: Secondary | ICD-10-CM

## 2018-02-12 MED ORDER — NEBIVOLOL HCL 10 MG PO TABS: 10.0000 mg | ORAL_TABLET | Freq: Every day | ORAL | 0 refills | Status: DC

## 2018-02-12 NOTE — Telephone Encounter (Signed)
°*  STAT* If patient is at the pharmacy, call can be transferred to refill team.   1. Which medications need to be refilled? (please list name of each medication and dose if known) need new prescription for Bystolic  2. Which pharmacy/location (including street and city if local pharmacy) is medication to be sent to?[facility] [place]  3. Do they need a 30 day or 90 day supply? 90 and refillls

## 2018-02-21 ENCOUNTER — Other Ambulatory Visit: Payer: Self-pay | Admitting: Cardiovascular Disease

## 2018-03-13 ENCOUNTER — Other Ambulatory Visit: Payer: Self-pay | Admitting: Cardiovascular Disease

## 2018-03-13 DIAGNOSIS — I10 Essential (primary) hypertension: Secondary | ICD-10-CM

## 2018-03-13 NOTE — Telephone Encounter (Signed)
Rx sent to pharmacy   

## 2018-03-19 ENCOUNTER — Other Ambulatory Visit: Payer: Self-pay | Admitting: Cardiovascular Disease

## 2018-03-19 NOTE — Telephone Encounter (Signed)
Rx request sent to pharmacy.  

## 2018-04-03 ENCOUNTER — Encounter: Payer: Self-pay | Admitting: Cardiovascular Disease

## 2018-04-03 ENCOUNTER — Ambulatory Visit (INDEPENDENT_AMBULATORY_CARE_PROVIDER_SITE_OTHER): Payer: Medicare Other | Admitting: Cardiovascular Disease

## 2018-04-03 VITALS — BP 132/77 | HR 66 | Ht 60.0 in | Wt 144.8 lb

## 2018-04-03 DIAGNOSIS — E7800 Pure hypercholesterolemia, unspecified: Secondary | ICD-10-CM

## 2018-04-03 DIAGNOSIS — Z955 Presence of coronary angioplasty implant and graft: Secondary | ICD-10-CM

## 2018-04-03 DIAGNOSIS — I119 Hypertensive heart disease without heart failure: Secondary | ICD-10-CM | POA: Diagnosis not present

## 2018-04-03 DIAGNOSIS — I209 Angina pectoris, unspecified: Secondary | ICD-10-CM

## 2018-04-03 NOTE — Progress Notes (Signed)
Cardiology Office Note   Date:  04/03/2018   ID:  VIRIDIANA SPAID, DO [birth year], MRN [id number]  PCP:  Doneen Poisson, MD  Cardiologist:   Chilton Si, MD   No chief complaint on file.    History of Present Illness: Vickie [name] is a 77 y.o. female with hypertension, white coat hypertension, hypothyroidism, GERD and hyperlipidemia who presents for follow up.  In 2017 Vickie [name] reported intermittent chest tightness to her PCP.  She was started on aspirin and Protonix and referred to cardiology.  She underwent cardiac catheterization on 01/06/16 where she was found to have an ulcerated 90% proximal LAD lesion and a 90-95% lesion in OM 1.  She underwent successful implantation of a drug-eluting stent in the LAD and the OM lesion was medically managed, as it was a small caliber vessel.  She was started on nebivolol as she has a history of fatigue on other beta blockers. An echocardiogram on 01/19/16 revealed LVEF 55-60% and grade 1 diastolic dysfunction.  She had recurrent chest discomfort so both nebivolol and Imdur were increased with improvement in her symptoms.  Atorvastatin was discontinued due to transaminitis.  She was switched to rosuvastatin but developed drug induced constipation and R flank pain.  She was unwilling to try other statins.  At her last appointment ticagrelor was discontinued.  Her BP was high in clinic but controlled at home.  When her antihypertensives were increased in the past she developed hypotension.  She wanted to try red yeast rice but it upset her stomach and gave her headaches.  She has noticed increased upper arm discomfort over the last several weeks.  It occurs both at reast and with exertion.  It lasts for a few seconds at a time.  There is no associated shortness of breath, nausea or diaphoresis.  She denies lower extremity edema, orthopnea or PND.   Past Medical History:  Diagnosis Date  . Chronic diastolic heart failure (HCC) 01/20/2016   Grade I by Echo 01/19/2016  . Chronic urticaria 03/16/2013  . Essential hypertension 08/14/2006  . Gastroesophageal reflux disease 07/03/2013   Symptoms are not significant enough that patient wants pharmacologic therapy at this point   . Hyperlipidemia LDL goal < 130 07/03/2013  . Hypothyroidism 08/14/2006  . Overweight (BMI 25.0-29.9) 07/03/2013    Past Surgical History:  Procedure Laterality Date  . CARDIAC CATHETERIZATION N/A 01/06/2016   Procedure: Left Heart Cath and Coronary Angiography;  Surgeon: Thurmon Fair, MD;  Location: [facility]IVE [facility];  Service: Cardiovascular;  Laterality: N/A;  . CARDIAC CATHETERIZATION N/A 01/06/2016   Procedure: Coronary Stent Intervention;  Surgeon: Lennette Bihari, MD;  Location: [facility]AS[facility] CV [facility];  Service: Cardiovascular;  Laterality: N/A;  . CHOLECYSTECTOMY    . TUBAL LIGATION      Current Outpatient Medications  Medication Sig Dispense Refill  . acetaminophen (TYLENOL) 500 MG tablet Take 1,000 mg by mouth at bedtime as needed for mild pain. Reported on 01/20/2016    . aspirin 81 MG tablet Take 1 tablet (81 mg total) by mouth daily. 30 tablet 6  . isosorbide mononitrate (IMDUR) 60 MG 24 hr tablet TAKE 1 TABLET (60 MG TOTAL) BY MOUTH DAILY. PLEASE CALL TO MAKE APPOINTMENT FOR FURTHER REFILLS. 30 tablet 1  . levothyroxine (SYNTHROID, LEVOTHROID) 88 MCG tablet Take 1 tablet (88 mcg total) by mouth daily before breakfast. 90 tablet 3  . lisinopril (PRINIVIL,ZESTRIL) 10 MG tablet Take 1 tablet (10 mg total) by mouth daily.  90 tablet 3  . nebivolol (BYSTOLIC) 10 MG tablet Take 1 tablet (10 mg total) by mouth daily. Keep OV for further refills. 30 tablet 0  . nitroGLYCERIN (NITROSTAT) 0.4 MG SL tablet Place 1 tablet (0.4 mg total) under the tongue every 5 (five) minutes as needed for chest pain. 90 tablet 3  . polyethylene glycol (MIRALAX / GLYCOLAX) packet Take 17 g by mouth daily. 14 each 0  . ranitidine (ZANTAC) 300 MG tablet Take 1 tablet (300 mg total)  by mouth daily as needed for heartburn. 90 tablet 3   No current facility-administered medications for this visit.     Allergies:   Atorvastatin and Rosuvastatin    Social History:  The patient  reports that she has never smoked. She has never used smokeless tobacco. She reports that she does not drink alcohol or use drugs.   Family History:  The patient's family history includes Healthy in her brother, sister, sister, sister, son, son, and son; Heart disease in her mother; Lung cancer in her sister; Unexplained death in her father.    ROS:  Please see the history of present illness.   Otherwise, review of systems are positive for headache.   All other systems are reviewed and negative.    PHYSICAL EXAM: VS:  BP 132/77   Pulse 66   Ht 5' (1.524 m)   Wt 144 lb 12.8 oz (65.7 kg)   LMP 10/20/1991   BMI 28.28 kg/m  , BMI Body mass index is 28.28 kg/m. GENERAL:  Well appearing.  No acute distress. HEENT: Pupils equal round and reactive, fundi not visualized, oral mucosa unremarkable NECK:  No jugular venous distention, waveform within normal limits, carotid upstroke brisk and symmetric, no bruits LUNGS:  Clear to auscultation bilaterally HEART:  RRR.  PMI not displaced or sustained,S1 and S2 within normal limits, no S3, no S4, no clicks, no rubs, II/VI, early-peaking systolic murmur at the RUSB murmurs ABD:  Flat, positive bowel sounds normal in frequency in pitch, no bruits, no rebound, no guarding, no midline pulsatile mass, no hepatomegaly, no splenomegaly EXT:  2 plus pulses throughout, no edema, no cyanosis no clubbing SKIN:  No rashes no nodules NEURO:  Cranial nerves II through XII grossly intact, motor grossly intact throughout PSYCH:  Cognitively intact, oriented to person place and time   EKG:  EKG is ordered today. The ekg ordered 01/26/16 demonstrates sinus rhythm rate 73 bpm.  Incomplete RBBB.  08/16/16: sinus rhythm.  Rate 68 bpm.  04/24/17: Sinus rhythm. Rate 73 bpm.  PVC. Poor R wave progression. Low voltage. 04/03/18: Sinus rhythm.  Rate 66 bpm.  Low voltage.  Poor R wave progression.  Incomplete RBBB  Echo 01/19/16: Study Conclusions  - Left ventricle: The cavity size was normal. Wall thickness was  normal. Systolic function was normal. The estimated ejection  fraction was in the range of 55% to 60%. Wall motion was normal;  there were no regional wall motion abnormalities. Doppler  parameters are consistent with abnormal left ventricular  relaxation (grade 1 diastolic dysfunction). - Left atrium: The atrium was mildly dilated. - Pericardium, extracardiac: A trivial pericardial effusion was  identified.  LHC 01/06/16:  Angiographic Findings:  1. The left main coronary artery bifurcates in the usual fashion into the left anterior descending artery and left circumflex coronary artery. The vessel is moderately calcified. There is a 25-30% stenosis in the distal left main artery. 2. The left anterior descending artery is a large vessel that reaches  the apex and generates 2 major diagonal branches. The proximal vessel is moderately calcified. There is an ulcerated proximal LAD stenosis up to 90% in severity 3. The left circumflex coronary artery is a medium-size vessel non dominant vessel that generates two major oblique marginal arteries. There is a 90-95% stenosis in the first OM branch, but this vessel is well under 2 mm in caliber. 4. The right coronary artery is a medium-size dominant vessel that generates a single branch posterior lateral ventricular system as well as the PDA. There is evidence of minima luminal irregularities and mild calcification. No hemodynamically meaningful stenoses are seen.  5. The left ventricle is normal in size. The left ventricle systolic function is normal with an estimated ejection fraction of 60-65%. Regional wall motion abnormalities are not seen. No left ventricular thrombus is seen. There is no mitral  insufficiency. The ascending aorta appears normal. There is no aortic valve stenosis by pullback. The left ventricular end-diastolic pressure is normal.    Recent Labs: 07/04/2017: ALT 13; BUN 9; Creatinine, Ser 0.82; Potassium 4.9; Sodium 142 07/05/2017: TSH 0.654    Lipid Panel    Component Value Date/Time   CHOL 169 07/04/2017 0919   TRIG 122 07/04/2017 0919   HDL 42 07/04/2017 0919   CHOLHDL 4.0 07/04/2017 0919   CHOLHDL 2.8 08/16/2016 0859   VLDL 21 08/16/2016 0859   LDLCALC 103 (H) 07/04/2017 0919      Wt Readings from Last 3 Encounters:  04/03/18 144 lb 12.8 oz (65.7 kg)  07/05/17 148 lb 14.4 oz (67.5 kg)  04/24/17 149 lb 6.4 oz (67.8 kg)      ASSESSMENT PLAN:  # CAD s/p LAD PCI: # Hyperlipidemia: Vickie [name] is doing well.  She has not experienced any angina. It is been over a year since her PCI.  She does still have a 99% OM lesion.  She preferred to stop ticagrelor rather than taking a reduced dose.  She has been intolerant of more than one statin and red yeast rice.  We will refer her to lipid clinic for a PCSK9 inhibitor. Continue aspirin, nebivolol, and  Imdur.  # Hypertension:  # White Coat Hypertension: BP is mildly above goal today but has been controlled at home.  Continue nebivolol, lisinoprill, and Imdur.  When we tried increasing lisinopril she developed hypotension.      Current medicines are reviewed at length with the patient today.  The patient does not have concerns regarding medicines.   The following changes have been made: PCSK9 inhibitor  Labs/ tests ordered today include:   No orders of the defined types were placed in this encounter.    Disposition:   FU with Georgie Eduardo C. [name], MD, [facility] in 1 year.    Signed, Leannah Guse C. [name], MD, [facility]  04/03/2018 10:48 AM    [facility]

## 2018-04-03 NOTE — Patient Instructions (Signed)
Medication Instructions:  Your physician recommends that you continue on your current medications as directed. Please refer to the Current Medication list given to you today.  Labwork: none  Testing/Procedures: .none  Follow-Up: Your physician wants you to follow-up in: 1 year  You will receive a reminder letter in the mail two months in advance. If you don't receive a letter, please call our office to schedule the follow-up appointment.  Your physician recommends that you schedule a follow-up appointment in: PHARM D LIPID CLINIC   If you need a refill on your cardiac medications before your next appointment, please call your pharmacy.

## 2018-04-06 ENCOUNTER — Other Ambulatory Visit: Payer: Self-pay | Admitting: Cardiovascular Disease

## 2018-04-06 DIAGNOSIS — I10 Essential (primary) hypertension: Secondary | ICD-10-CM

## 2018-04-11 ENCOUNTER — Ambulatory Visit (INDEPENDENT_AMBULATORY_CARE_PROVIDER_SITE_OTHER): Payer: Medicare Other | Admitting: Pharmacist

## 2018-04-11 DIAGNOSIS — E785 Hyperlipidemia, unspecified: Secondary | ICD-10-CM

## 2018-04-11 DIAGNOSIS — I209 Angina pectoris, unspecified: Secondary | ICD-10-CM | POA: Diagnosis not present

## 2018-04-11 MED ORDER — NITROGLYCERIN 0.4 MG SL SUBL: 0.4000 mg | SUBLINGUAL_TABLET | SUBLINGUAL | 3 refills | Status: DC | PRN

## 2018-04-11 NOTE — Patient Instructions (Addendum)
It was great seeing you today!  Today, we discussed starting Repatha a PCSK9 inhibitor that will help lower you LDL (bad cholesterol). We will [facility] and will contact you after we hear back from [facility]. After we contact you we will [facility] the [facility] in [place] and they will ship you the medication to your home. Once you receive the medication you can either administer the medication at home or come to the clinic to administer the first injection. When we contact you after we have heard back from [facility], we will schedule you for follow-up labs after your 3rd dose of the medication. If you have any questions give the clinic a call at [phone].

## 2018-04-11 NOTE — Progress Notes (Signed)
Patient ID: Vickie [name]                 DOB: 07/17/1941                    MRN: [id number]     HPI: Vickie [name] is a 77 y.o. female patient of Dr. [name] that presents today for lipid evaluation.  PMH includes HTN, HLD, CAD s/p LAD PCI, hx of cardiac catheterization on 01/06/16 where she was found to have an ulcerated 90% proximal LAD lesion and a 90-95% lesion in OM 1. Implantation of DES in the LAD and the OM lesion was medically managed. Patient originally was taking pravastatin 40 mg in 2017 however was switched to atorvastatin 80 mg after her heart cath and DES. Patient was switched from atorvastatin to rosuvastatin in 2018 due to transaminitis (AST 53, ALT 65). After discontinuing atorvastatin, her LFTs returned to normal (AST 19, ALT 15). However, she stopped rosuvastatin due to drug induced constipation. She is unwilling to try any other statins as she stated her bowels are getting back to normal.   Patient presents today for lipid management. Currently she is also in transition from moving from her home to her son's home which is a stressful transition. Before her stent, she was not taking the pravastatin as she didn't agree with taking statins. After her stent, she did take atorvastatin which raised her liver enzymes and then switched to rosuvastatin which caused her severe constipation. Has been off the rosuvastatin for over a year. It took her 8 months for her bowels to return, but still has flares and will still have issues (as recently as a few weeks ago). Has tried red yeast rice after stopping the rosuvastatin but it gave her headaches. She is not interested in starting a statin again but willing to try a PCSK9i. She noted that she has a strong family history of heart disease, and wants to reduce her risk of having an event and being hospitalized. Also, states that her nitroglycerin is over 76 years old, and may need a refill.    Risk Factors: HTN, HLD, CAD s/p LAD PCI LDL Goal:  <70 mg/dL  Current Medications: none Intolerances: atorvastatin  80 mg- transaminitis, rosuvastatin 20 mg- drug induced constipation, red yeast rice- HA  Diet: most meals are from home. Diet has changed since December due to transition of moving. Does not really eat full meals and cannot say what food she has been eating, however she has not been cooking. Her son cooks sometimes. Drinks water, green tea, black tea; sometimes will add sugar to tea. Drinks couple of cups of coffee a day, mostly black.  Exercise: does yard work, walks up and down stairs, walks her dog.   Family History: mother- heart disease  Social History: never smoker, denies alcohol or other drug use.  Labs: 07/04/2017: TC 169, TG 122, HDL 42, LDL 103 (not on therapy) 02/05/17: TC 97, TG 166, HDL 33, LDL 31 (on rosuvastatin 20 mg)   Past Medical History:  Diagnosis Date  . Chronic diastolic heart failure (HCC) 01/20/2016   Grade I by Echo 01/19/2016  . Chronic urticaria 03/16/2013  . Essential hypertension 08/14/2006  . Gastroesophageal reflux disease 07/03/2013   Symptoms are not significant enough that patient wants pharmacologic therapy at this point   . Hyperlipidemia LDL goal < 130 07/03/2013  . Hypothyroidism 08/14/2006  . Overweight (BMI 25.0-29.9) 07/03/2013    Current Outpatient Medications on File  Prior to Visit  Medication Sig Dispense Refill  . acetaminophen (TYLENOL) 500 MG tablet Take 1,000 mg by mouth at bedtime as needed for mild pain. Reported on 01/20/2016    . aspirin 81 MG tablet Take 1 tablet (81 mg total) by mouth daily. 30 tablet 6  . BYSTOLIC 10 MG tablet TAKE 1 TABLET (10 MG TOTAL) BY MOUTH DAILY. KEEP OV FOR FURTHER REFILLS. 30 tablet 10  . isosorbide mononitrate (IMDUR) 60 MG 24 hr tablet TAKE 1 TABLET (60 MG TOTAL) BY MOUTH DAILY. PLEASE CALL TO MAKE APPOINTMENT FOR FURTHER REFILLS. 30 tablet 1  . levothyroxine (SYNTHROID, LEVOTHROID) 88 MCG tablet Take 1 tablet (88 mcg total) by mouth daily  before breakfast. 90 tablet 3  . lisinopril (PRINIVIL,ZESTRIL) 10 MG tablet Take 1 tablet (10 mg total) by mouth daily. 90 tablet 3  . nitroGLYCERIN (NITROSTAT) 0.4 MG SL tablet Place 1 tablet (0.4 mg total) under the tongue every 5 (five) minutes as needed for chest pain. 90 tablet 3  . polyethylene glycol (MIRALAX / GLYCOLAX) packet Take 17 g by mouth daily. 14 each 0  . ranitidine (ZANTAC) 300 MG tablet Take 1 tablet (300 mg total) by mouth daily as needed for heartburn. 90 tablet 3   No current facility-administered medications on file prior to visit.     Allergies  Allergen Reactions  . Atorvastatin Other (See Comments)    Elevated liver functions numbers  . Rosuvastatin Other (See Comments)    constipation    Assessment/Plan: Hyperlipidemia: LDL currently not at goal (<70 mg/dL). Discussed statin intolerances with patient and PCSK9i options. Patient was agreeable to starting Repatha. Discussed injection technique, MOA, and efficacy of medication. Patient thinks she will be able to administer the medication to herself at home, but will call if she has any questions. Will send prior authorization to [facility] and contact patient before [facility] [place]. Patient agreeable to having medication shipped to her home and confirmed address with patient as she has been in the process of moving. Informed patient that she will need to come to clinic for follow-up labs after her 3rd dose of the medication.    Thank you,  Freddie ApleyKelley M. Cleatis PolkaAuten, PharmD  [facility]  04/11/2018 8:30 AM  Patient seen with Thomes CakeWendy Sun, PharmD Candidate

## 2018-04-13 ENCOUNTER — Other Ambulatory Visit: Payer: Self-pay | Admitting: Cardiovascular Disease

## 2018-04-14 ENCOUNTER — Telehealth: Payer: Self-pay | Admitting: Pharmacist

## 2018-04-14 ENCOUNTER — Encounter: Payer: Self-pay | Admitting: Cardiovascular Disease

## 2018-04-14 MED ORDER — ALIROCUMAB 75 MG/ML ~~LOC~~ SOPN: 1.0000 "pen " | PEN_INJECTOR | SUBCUTANEOUS | 11 refills | Status: DC

## 2018-04-14 NOTE — Telephone Encounter (Signed)
Patient PA denied through Medicaid as she has Medicare Part D plan. Patient's part D plan prefers Praluent. PA for Praluent sent through covermymeds.  Medicare Part D information: RX BIN: [id number] PCN: [id number] Member : [id number] Group ID: [id number]

## 2018-04-14 NOTE — Addendum Note (Signed)
Addended by: Levin BaconAUTEN, Dovey Fatzinger M on: 04/14/2018 01:55 PM   Modules accepted: Orders

## 2018-04-14 NOTE — Telephone Encounter (Addendum)
[employer] approved PA for Praluent 75 mg/mL. [facility] [place]. Tried to call patient but she did not answer and had no voicemail.

## 2018-04-18 NOTE — Telephone Encounter (Addendum)
Praluent copay $3.80. Pharmacy has tried calling pt to set up shipment but unable to reach her. I also called pt to inform her of copay and that rx is ready to be shipped. No answer and unable to leave voicemail. Tried calling patient's son # on file, # is not valid.

## 2018-04-18 NOTE — Telephone Encounter (Addendum)
Called pt again and she answered, stated she never answers the phone unless she has the number in her directory. Advised pt to add our number to her directory as we have been unable to reach her with important information and she doesn't have a voicemail set up to leave her any messages. She did not seem inclined to set up a voicemail. Advised her that specialty pharmacy has also tried to reach her but she did not answer their call either. Asked pt to call [facility] to coordinate Praluent shipment and provided her with their number. She stated she would do so.

## 2018-04-23 ENCOUNTER — Other Ambulatory Visit: Payer: Self-pay | Admitting: Cardiovascular Disease

## 2018-05-20 ENCOUNTER — Telehealth: Payer: Self-pay | Admitting: Internal Medicine

## 2018-06-09 ENCOUNTER — Encounter: Payer: Self-pay | Admitting: Internal Medicine

## 2018-06-09 ENCOUNTER — Ambulatory Visit (INDEPENDENT_AMBULATORY_CARE_PROVIDER_SITE_OTHER): Payer: Medicare Other | Admitting: Internal Medicine

## 2018-06-09 VITALS — BP 123/65 | HR 77 | Wt 145.1 lb

## 2018-06-09 DIAGNOSIS — E663 Overweight: Secondary | ICD-10-CM

## 2018-06-09 DIAGNOSIS — I25118 Atherosclerotic heart disease of native coronary artery with other forms of angina pectoris: Secondary | ICD-10-CM

## 2018-06-09 DIAGNOSIS — I10 Essential (primary) hypertension: Secondary | ICD-10-CM

## 2018-06-09 DIAGNOSIS — K219 Gastro-esophageal reflux disease without esophagitis: Secondary | ICD-10-CM

## 2018-06-09 DIAGNOSIS — E785 Hyperlipidemia, unspecified: Secondary | ICD-10-CM

## 2018-06-09 DIAGNOSIS — R195 Other fecal abnormalities: Secondary | ICD-10-CM | POA: Insufficient documentation

## 2018-06-09 DIAGNOSIS — I209 Angina pectoris, unspecified: Secondary | ICD-10-CM | POA: Diagnosis not present

## 2018-06-09 DIAGNOSIS — K5909 Other constipation: Secondary | ICD-10-CM

## 2018-06-09 DIAGNOSIS — E038 Other specified hypothyroidism: Secondary | ICD-10-CM

## 2018-06-09 DIAGNOSIS — Z23 Encounter for immunization: Secondary | ICD-10-CM | POA: Diagnosis not present

## 2018-06-09 HISTORY — DX: Other constipation: K59.09

## 2018-06-09 MED ORDER — POLYETHYLENE GLYCOL 3350 17 G PO PACK: 17.0000 g | PACK | Freq: Every day | ORAL | 3 refills | Status: DC | PRN

## 2018-06-09 NOTE — Assessment & Plan Note (Signed)
Assessment  As she has failed to statins she was a candidate for Praluent for her hyperlipidemia.  She started this 1 month ago and has taken 2 doses without known side effects.  Plan  We will check a lipid panel to assess the efficacy of this new to hyperlipid therapy.  She will continue on this medication for the foreseeable future if efficacious.  We will reassess for tolerance at the follow-up visit.

## 2018-06-09 NOTE — Assessment & Plan Note (Signed)
She was willing to take the flu vaccination today and this was provided.  She was uninterested in other health maintenance interventions such as the tetanus booster, Pneumovax, or DEXA scan.  We will reassess her willingness to consider these health maintenance the options at follow-up visit.

## 2018-06-09 NOTE — Assessment & Plan Note (Signed)
Assessment  She has lost 3 pounds in the last year.  This is without trying.  It is likely she has been active as she is in the process of preparing her house for sale now that she has moved in with her son in [place].  Plan  She was praised for her weight loss over the last year and was encouraged to continue with her lifestyle modifications in hopes of continued steady weight loss.

## 2018-06-09 NOTE — Assessment & Plan Note (Signed)
Assessment  She is tolerating the Synthroid 88 mcg by mouth daily without difficulty.  She has no signs or symptoms consistent with either hypothyroidism or hyperthyroidism.  Plan  We obtained a TSH today to assess the efficacy of the Synthroid at 88 mcg by mouth daily.  The result is pending at the time of this dictation.

## 2018-06-09 NOTE — Assessment & Plan Note (Signed)
Assessment  Her blood pressure today is excellent at 123/65.  This is on bystolic 10 mg by mouth daily, lisinopril 10 mg by mouth daily, and isosorbide mononitrate 60 mg by mouth daily.  Plan  We will continue the bystolic 10 mg by mouth daily, lisinopril 10 mg by mouth daily, and isosorbide mononitrate 60 mg by mouth daily.  We will reassess the efficacy of this therapy at the follow-up visit with a repeat blood pressure.

## 2018-06-09 NOTE — Progress Notes (Signed)
   Subjective:    Patient ID: Vickie [name], female    DOB: [birth year], 77 y.o.   MRN: [id number]  HPI  Vickie [name] is here for follow-up of her coronary artery disease with stable angina, hyperlipidemia, hypertension, overweight status, gastroesophageal reflux disease, chronic constipation, and hypothyroidism. Please see the A&P for the status of the pt's chronic medical problems.  Her only acute complaint today is a change in the stool caliber.  It has been more narrow.  This has been associated with intermittent right lower quadrant pain that she is unable to further characterize.  She denies any blood in the stool or change in the stool color such as melena.  She has had a 3 pound weight loss over the last year which has been relatively unintentional.  She is never had a previous colonoscopy as she has not been interested, but with this change in stool caliber, she is open to a colonoscopy.  She is without other acute complaints at this time.  Review of Systems  Constitutional: Negative for activity change, appetite change and unexpected weight change.  Respiratory: Negative for chest tightness, shortness of breath and wheezing.   Cardiovascular: Positive for chest pain. Negative for palpitations and leg swelling.       Very intermittent chest pressure not necessarily associated with exertion.  Will last seconds to a minute and she will take an occasional SL NTG with relief.  Gastrointestinal: Positive for abdominal pain and constipation. Negative for abdominal distention, anal bleeding, blood in stool, diarrhea, nausea and vomiting.  Genitourinary: Negative for difficulty urinating.  Musculoskeletal: Positive for neck pain. Negative for arthralgias, back pain, gait problem and joint swelling.  Neurological: Positive for weakness.       Generalized weakness and fatigue recently.      Objective:   Physical Exam  Constitutional: She is oriented to person, place, and time. She  appears well-developed and well-nourished. No distress.  HENT:  Head: Normocephalic and atraumatic.  Eyes: Conjunctivae are normal. Right eye exhibits no discharge. Left eye exhibits no discharge. No scleral icterus.  Cardiovascular: Normal rate, regular rhythm and normal heart sounds. Exam reveals no gallop and no friction rub.  No murmur heard. Pulmonary/Chest: Effort normal and breath sounds normal. No stridor. No respiratory distress. She has no wheezes. She has no rales.  Abdominal: Soft. Bowel sounds are normal. She exhibits no distension and no mass. There is no tenderness. There is no rebound and no guarding. No hernia.  Musculoskeletal: Normal range of motion. She exhibits no edema, tenderness or deformity.  Neurological: She is alert and oriented to person, place, and time. She exhibits normal muscle tone.  Skin: Skin is warm and dry. She is not diaphoretic. No erythema.  Psychiatric: She has a normal mood and affect. Her behavior is normal. Judgment and thought content normal.  Nursing note and vitals reviewed.     Assessment & Plan:   Please see problem based charting.

## 2018-06-09 NOTE — Assessment & Plan Note (Signed)
Assessment  It is very rare that she has any symptoms consistent with her gastroesophageal reflux disease.  When she does, she takes ranitidine 300 mg by mouth as needed.  This is effective.  Plan  We have decided to remove the ranitidine from her active medication list as she takes it very rarely.  Should she need it she will purchase it over-the-counter.  We will reassess her gastroesophageal reflux disease symptoms at the follow-up visit.

## 2018-06-09 NOTE — Patient Instructions (Addendum)
It was great to see you today.  I am sorry you are still have some lower abdominal pain.  1) Keep taking all of the medications as you are.  2) We gave you the flu shot today.  3) I checked some labs on you today.  I will call with the results.  4) I placed a referral for a colonoscopy given the change in your stool caliber.  If negative we will have to think about other potential causes of abdominal pain.  I will see you back in 1 year, sooner if necessary.

## 2018-06-09 NOTE — Assessment & Plan Note (Signed)
Assessment  For her chronic constipation she has been taking MiraLAX as needed.  This has been effective and on this medication she considers her self not to be constipated.  Plan  We will continue the as needed MiraLAX to maintain bowel regularity.  We will reassess the efficacy of this therapy at the follow-up visit.

## 2018-06-09 NOTE — Assessment & Plan Note (Signed)
Assessment  She has had intermittent chest pain that she characterizes as pressure.  The causes are variable and it is not necessarily with exertion.  At times, she exerts herself strenuously without any chest pain.  At other times the pain occurs at rest.  The pain is somewhat atypical and that it lasts only seconds to up to a minute.  When she has time to take nitroglycerin the pain does resolve.  She states she has been compliant with her isosorbide mononitrate, aspirin, antilipid therapy, and beta-blocker.  The pain has not changed recently.  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  We will continue with the new antilipid therapy, isosorbide mononitrate extreme milligrams by mouth daily, aspirin 81 mg by mouth daily, sublingual nitroglycerin 0.4 mg sublingual as needed, and bystolic 10 milligrams by mouth daily.  We will reassess the efficacy of this antianginal regimen at the follow-up visit.

## 2018-06-09 NOTE — Assessment & Plan Note (Signed)
Assessment  As noted above she has had a change in her stool caliber.  It is now half the width that it used to be.  This, in combination with the intermittent right lower quadrant pain is concerning.  She is now willing to have this evaluated colonoscopically.  Plan  An ambulatory referral for colonoscopy was made.  Liver function tests and a complete blood count were drawn during this visit and they are pending at the time of this dictation.  We will follow-up on the results of the colonoscopy.  If negative for an explanation for her abdominal pain we will consider other possible etiologies.

## 2018-06-10 LAB — CMP14 + ANION GAP
ALBUMIN: 4.4 g/dL (ref 3.5–4.8)
ALK PHOS: 61 IU/L (ref 39–117)
ALT: 19 IU/L (ref 0–32)
AST: 20 IU/L (ref 0–40)
Albumin/Globulin Ratio: 2 (ref 1.2–2.2)
Anion Gap: 15 mmol/L (ref 10.0–18.0)
BILIRUBIN TOTAL: 0.3 mg/dL (ref 0.0–1.2)
BUN / CREAT RATIO: 14 (ref 12–28)
BUN: 11 mg/dL (ref 8–27)
CHLORIDE: 107 mmol/L — AB (ref 96–106)
CO2: 23 mmol/L (ref 20–29)
Calcium: 9.3 mg/dL (ref 8.7–10.3)
Creatinine, Ser: 0.79 mg/dL (ref 0.57–1.00)
GFR calc Af Amer: 84 mL/min/{1.73_m2} (ref >59)
GFR calc non Af Amer: 73 mL/min/{1.73_m2} (ref >59)
Globulin, Total: 2.2 g/dL (ref 1.5–4.5)
Glucose: 100 mg/dL — ABNORMAL HIGH (ref 65–99)
Potassium: 4.4 mmol/L (ref 3.5–5.2)
SODIUM: 145 mmol/L — AB (ref 134–144)
Total Protein: 6.6 g/dL (ref 6.0–8.5)

## 2018-06-10 LAB — LIPID PANEL
CHOLESTEROL TOTAL: 98 mg/dL — AB (ref 100–199)
Chol/HDL Ratio: 2.6 ratio (ref 0.0–4.4)
HDL: 37 mg/dL — ABNORMAL LOW (ref >39)
LDL Calculated: 30 mg/dL (ref 0–99)
TRIGLYCERIDES: 154 mg/dL — AB (ref 0–149)
VLDL Cholesterol Cal: 31 mg/dL (ref 5–40)

## 2018-06-10 LAB — CBC
Hematocrit: 42.4 % (ref 34.0–46.6)
Hemoglobin: 13.8 g/dL (ref 11.1–15.9)
MCH: 30.2 pg (ref 26.6–33.0)
MCHC: 32.5 g/dL (ref 31.5–35.7)
MCV: 93 fL (ref 79–97)
PLATELETS: 355 10*3/uL (ref 150–450)
RBC: 4.57 x10E6/uL (ref 3.77–5.28)
RDW: 12.3 % (ref 12.3–15.4)
WBC: 7.1 10*3/uL (ref 3.4–10.8)

## 2018-06-10 LAB — TSH: TSH: 0.466 u[IU]/mL (ref 0.450–4.500)

## 2018-06-10 NOTE — Progress Notes (Signed)
Patient ID: Vickie [name], female   DOB: June 03, 1941, 77 y.o.   MRN: [id number]  TSH 0.466  Continue synthroid at current dose  CBC Hgb 13.8, MCV 93  Encouraging as there does not appear to be a microcytic anemia.  Colonoscopy still indicated.  BMP K 4.4, Cr 0.79, eGFR 73  Continue current antihypertensives  LFTs unremarkable  Total cholesterol 98 Triglycerides 154 HDL 37 LDL 30  Anti-hyperlipidemia therapy has been effective.  Will continue.  Received third dose today.  I called Vickie [name] to inform her of the results.

## 2018-06-19 ENCOUNTER — Ambulatory Visit (INDEPENDENT_AMBULATORY_CARE_PROVIDER_SITE_OTHER): Payer: Medicare Other | Admitting: Gastroenterology

## 2018-06-19 ENCOUNTER — Encounter: Payer: Self-pay | Admitting: Gastroenterology

## 2018-06-19 VITALS — BP 162/80 | HR 75 | Ht 60.0 in | Wt 144.1 lb

## 2018-06-19 DIAGNOSIS — Z8719 Personal history of other diseases of the digestive system: Secondary | ICD-10-CM

## 2018-06-19 DIAGNOSIS — R195 Other fecal abnormalities: Secondary | ICD-10-CM

## 2018-06-19 DIAGNOSIS — Z1211 Encounter for screening for malignant neoplasm of colon: Secondary | ICD-10-CM | POA: Diagnosis not present

## 2018-06-19 NOTE — Progress Notes (Addendum)
Chief Complaint: Change in stool caliber  Referring Provider:  Doneen Poisson, MD      ASSESSMENT AND PLAN;   #1. Change in stool caliber with history of constipation.  Refusing colonoscopy.  #2. RUQ pain (resolved). (Patient convinced that it was due to Crestor). Nl CBC, CMP and TSH 05/2018.  #3. Colorectal cancer screening.  Plan: - She does not like to take medications.  Hence I have suggested to start prunes 1/day. - Increase water intake and start walking 30 minutes at least 3 times a week. - Cologuard screening test.  She wants to hold off on colonoscopy.  She does understand the risks and benefits.  She does understand that if Cologuard screening test is positive, she would need colonoscopy. - MiraLAX 17 g p.o. once a day as needed (if no BM x 3-4 days) has she already has been doing. - Proceed with CT scan of the abdomen and pelvis if the above work-up is negative and continued problems.  She would like to hold off on CT at the present time. -She will call us if she still has problems.  Labs reviewed in detail with the patient.   HPI:    Vickie [name] is a 77 y.o. female  Started having right-sided abdominal discomfort ever since she has been on Crestor.  Crestor has been discontinued. Also has change in stool caliber over the last few months. She does admit that she has not been eating as before ever since she moved in with her son. History of chronic constipation for which she does take MiraLAX on as-needed basis. BMs 1 in 3 to 4 days, hard stools, better with MiraLAX but then has diarrhea No nonsteroidals No nausea, vomiting, heartburn, regurgitation, odynophagia or dysphagia.  No significant diarrhea. There is no melena or hematochezia. No unintentional weight loss. The abdominal pain/discomfort does get better with defecation.  Had normal CBC, CMP and TSH 05/2018   Past Medical History:  Diagnosis Date  . CAD (coronary artery disease)   . Chronic  diastolic heart failure (HCC) 01/20/2016   Grade I by Echo 01/19/2016  . Chronic urticaria 03/16/2013  . Essential hypertension 08/14/2006  . Gallstones   . Gastroesophageal reflux disease 07/03/2013   Symptoms are not significant enough that patient wants pharmacologic therapy at this point   . Hyperlipidemia LDL goal < 130 07/03/2013  . Hypothyroidism 08/14/2006  . Overweight (BMI 25.0-29.9) 07/03/2013    Past Surgical History:  Procedure Laterality Date  . CARDIAC CATHETERIZATION N/A 01/06/2016   Procedure: Left Heart Cath and Coronary Angiography;  Surgeon: Thurmon Fair, MD;  Location: [facility];  Service: Cardiovascular;  Laterality: N/A;  . CARDIAC CATHETERIZATION N/A 01/06/2016   Procedure: Coronary Stent Intervention;  Surgeon: Lennette Bihari, MD;  Location: [facility];  Service: Cardiovascular;  Laterality: N/A;  . CHOLECYSTECTOMY    . TUBAL LIGATION      Family History  Problem Relation Age of Onset  . Lung cancer Sister   . Healthy Sister        Unknown health/Unknown health  . Healthy Brother        Unknown health  . Heart disease Mother   . Unexplained death Father        Killed in World War II  . Healthy Son   . Healthy Son   . Bladder Cancer Son   . Breast cancer Sister   . Healthy Sister  Unknown health  . Bladder Cancer Maternal Aunt   . Colon cancer Neg Hx   . Esophageal cancer Neg Hx     Social History   Tobacco Use  . Smoking status: Never Smoker  . Smokeless tobacco: Never Used  Substance Use Topics  . Alcohol use: No  . Drug use: No    Current Outpatient Medications  Medication Sig Dispense Refill  . Alirocumab (PRALUENT) 75 MG/ML SOPN Inject 1 pen into the skin every 14 (fourteen) days. 2 pen 11  . aspirin 81 MG tablet Take 1 tablet (81 mg total) by mouth daily. 30 tablet 6  . BYSTOLIC 10 MG tablet TAKE 1 TABLET (10 MG TOTAL) BY MOUTH DAILY. KEEP OV FOR FURTHER REFILLS. 30 tablet 10  . isosorbide mononitrate (IMDUR) 60  MG 24 hr tablet Take 1 tablet (60 mg total) by mouth daily. 30 tablet 11  . levothyroxine (SYNTHROID, LEVOTHROID) 88 MCG tablet Take 1 tablet (88 mcg total) by mouth daily before breakfast. 90 tablet 3  . lisinopril (PRINIVIL,ZESTRIL) 10 MG tablet TAKE 1 TABLET BY MOUTH EVERY DAY 90 tablet 3  . nitroGLYCERIN (NITROSTAT) 0.4 MG SL tablet Place 1 tablet (0.4 mg total) under the tongue every 5 (five) minutes as needed for chest pain. 90 tablet 3  . polyethylene glycol (MIRALAX / GLYCOLAX) packet Take 17 g by mouth daily as needed for moderate constipation. 100 each 3   No current facility-administered medications for this visit.     Allergies  Allergen Reactions  . Atorvastatin Other (See Comments)    Elevated liver functions numbers  . Rosuvastatin Other (See Comments)    constipation    Review of Systems:  Constitutional: Denies fever, chills, diaphoresis, appetite change and fatigue.  HEENT: Denies photophobia, eye pain, redness, hearing loss, ear pain, congestion, sore throat, rhinorrhea, sneezing, mouth sores, neck pain, neck stiffness and tinnitus.   Respiratory: Denies SOB, DOE, cough, chest tightness,  and wheezing.   Cardiovascular: Denies chest pain, palpitations and leg swelling.  Genitourinary: Denies dysuria, urgency, frequency, hematuria, flank pain and difficulty urinating.  Musculoskeletal: Denies myalgias, back pain, joint swelling, arthralgias and gait problem.  Skin: No rash.  Neurological: Denies dizziness, seizures, syncope, weakness, light-headedness, numbness and headaches.  Hematological: Denies adenopathy. Easy bruising, personal or family bleeding history  Psychiatric/Behavioral: No anxiety or depression     Physical Exam:    BP (!) 162/80   Pulse 75   Ht 5' (1.524 m)   Wt 144 lb 2 oz (65.4 kg)   LMP 10/20/1991   BMI 28.15 kg/m  Filed Weights   06/19/18 1034  Weight: 144 lb 2 oz (65.4 kg)   Constitutional:  Well-developed, in no acute  distress. Psychiatric: Normal mood and affect. Behavior is normal. HEENT: Pupils normal.  Conjunctivae are normal. No scleral icterus. Neck supple.  Cardiovascular: Normal rate, regular rhythm. No edema Pulmonary/chest: Effort normal and breath sounds normal. No wheezing, rales or rhonchi. Abdominal: Soft, nondistended. Nontender. Bowel sounds active throughout. There are no masses palpable. No hepatomegaly. Rectal: Refused Neurological: Alert and oriented to person place and time. Skin: Skin is warm and dry. No rashes noted.  Data Reviewed: I have personally reviewed following labs and imaging studies  CBC: CBC Latest Ref Rng & Units 06/09/2018 01/07/2016 01/05/2016  WBC 3.4 - 10.8 x10E3/uL 7.1 8.4 8.7  Hemoglobin 11.1 - 15.9 g/dL 40.9 81.1 91.4  Hematocrit 34.0 - 46.6 % 42.4 37.4 43.6  Platelets 150 - 450 x10E3/uL 355 341 427(H)  CMP: CMP Latest Ref Rng & Units 06/09/2018 07/04/2017 02/25/2017  Glucose 65 - 99 mg/dL 161(W) 91 95  BUN 8 - 27 mg/dL 11 9 9   Creatinine 0.57 - 1.00 mg/dL 9.60 4.54 0.98  Sodium 134 - 144 mmol/L 145(H) 142 143  Potassium 3.5 - 5.2 mmol/L 4.4 4.9 4.5  Chloride 96 - 106 mmol/L 107(H) 105 106  CO2 20 - 29 mmol/L 23 25 22   Calcium 8.7 - 10.3 mg/dL 9.3 9.6 9.4  Total Protein 6.0 - 8.5 g/dL 6.6 6.9 6.9  Total Bilirubin 0.0 - 1.2 mg/dL 0.3 0.4 0.3  Alkaline Phos 39 - 117 IU/L 61 72 73  AST 0 - 40 IU/L 20 18 22   ALT 0 - 32 IU/L 19 13 19      Edman Circle, MD 06/19/2018, 10:54 AM  Cc: Doneen Poisson, MD

## 2018-06-19 NOTE — Patient Instructions (Signed)
If you are age 76 or older, your body mass index should be between 23-30. Your Body mass index is 28.15 kg/m. If this is out of the aforementioned range listed, please consider follow up with your Primary Care Provider.  If you are age 87 or younger, your body mass index should be between 19-25. Your Body mass index is 28.15 kg/m. If this is out of the aformentioned range listed, please consider follow up with your Primary Care Provider.   Your provider has ordered Cologuard testing as an option for colon cancer screening. This is performed by [facility] [facility] [facility] and may be out of network with your insurance. PRIOR to completing the test, it is YOUR responsibility to contact your insurance about covered benefits for this test. Your out of pocket expense could be anywhere from $0.00 to $649.00.   When you call to check coverage with your insurer, please provide the following information:   -The ONLY provider of Cologuard is [facility], dispensing  - CPT code for Cologuard is [id number].  [facility] [facility] NPI # [id number]  -[employer] Tax ID # [id number]   We have already sent your demographic and insurance information to [facility] [facility] [employer] (phone number [phone]) and they should contact you within the next week regarding your test. If you have not heard from them within the next week, please call our office at [phone].   Increase water intake.  Walk 30 minutes 3 times a week.   Thank you,  Dr. Lynann Bologna

## 2018-07-14 ENCOUNTER — Other Ambulatory Visit: Payer: Self-pay

## 2018-07-14 ENCOUNTER — Other Ambulatory Visit: Payer: Self-pay | Admitting: Internal Medicine

## 2018-07-14 DIAGNOSIS — E038 Other specified hypothyroidism: Secondary | ICD-10-CM

## 2018-07-16 ENCOUNTER — Other Ambulatory Visit: Payer: Self-pay

## 2018-07-16 NOTE — Telephone Encounter (Signed)
Called pharmacy, verified script sent by dr Josem Kaufmannklima 11/18, it is ready for pick up, attempted to call pt, no answer, pharm will call pt

## 2018-07-16 NOTE — Telephone Encounter (Signed)
levothyroxine (SYNTHROID, LEVOTHROID) 88 MCG tablet, refill request @  [id number] - [place], [place] - [address] AT [address] [phone] (Phone) [phone] (Fax)

## 2018-09-01 DIAGNOSIS — R194 Change in bowel habit: Secondary | ICD-10-CM | POA: Diagnosis not present

## 2018-09-01 DIAGNOSIS — Z1211 Encounter for screening for malignant neoplasm of colon: Secondary | ICD-10-CM | POA: Diagnosis not present

## 2018-09-01 DIAGNOSIS — Z8719 Personal history of other diseases of the digestive system: Secondary | ICD-10-CM | POA: Diagnosis not present

## 2018-11-24 ENCOUNTER — Encounter: Payer: Self-pay | Admitting: *Deleted

## 2019-01-06 ENCOUNTER — Other Ambulatory Visit: Payer: Self-pay | Admitting: Cardiovascular Disease

## 2019-01-06 DIAGNOSIS — I10 Essential (primary) hypertension: Secondary | ICD-10-CM

## 2019-01-30 ENCOUNTER — Other Ambulatory Visit: Payer: Self-pay | Admitting: Cardiovascular Disease

## 2019-01-30 DIAGNOSIS — I10 Essential (primary) hypertension: Secondary | ICD-10-CM

## 2019-03-01 ENCOUNTER — Other Ambulatory Visit: Payer: Self-pay | Admitting: Cardiovascular Disease

## 2019-03-01 DIAGNOSIS — I10 Essential (primary) hypertension: Secondary | ICD-10-CM

## 2019-03-26 ENCOUNTER — Other Ambulatory Visit: Payer: Self-pay | Admitting: Cardiovascular Disease

## 2019-03-26 DIAGNOSIS — I10 Essential (primary) hypertension: Secondary | ICD-10-CM

## 2019-04-06 ENCOUNTER — Other Ambulatory Visit: Payer: Self-pay | Admitting: Pharmacist

## 2019-04-06 MED ORDER — PRALUENT 75 MG/ML ~~LOC~~ SOAJ: 1.0000 "pen " | SUBCUTANEOUS | 11 refills | Status: DC

## 2019-04-07 ENCOUNTER — Encounter: Payer: Self-pay | Admitting: *Deleted

## 2019-04-15 ENCOUNTER — Other Ambulatory Visit: Payer: Self-pay | Admitting: Cardiovascular Disease

## 2019-04-19 ENCOUNTER — Other Ambulatory Visit: Payer: Self-pay | Admitting: Cardiovascular Disease

## 2019-04-19 DIAGNOSIS — I10 Essential (primary) hypertension: Secondary | ICD-10-CM

## 2019-04-22 ENCOUNTER — Encounter: Payer: Self-pay | Admitting: Internal Medicine

## 2019-04-22 ENCOUNTER — Other Ambulatory Visit: Payer: Self-pay

## 2019-04-22 ENCOUNTER — Ambulatory Visit (INDEPENDENT_AMBULATORY_CARE_PROVIDER_SITE_OTHER): Payer: Medicare Other | Admitting: Internal Medicine

## 2019-04-22 VITALS — BP 121/74 | HR 69 | Temp 98.6°F | Wt 150.1 lb

## 2019-04-22 DIAGNOSIS — Z79899 Other long term (current) drug therapy: Secondary | ICD-10-CM | POA: Diagnosis not present

## 2019-04-22 DIAGNOSIS — Z6829 Body mass index (BMI) 29.0-29.9, adult: Secondary | ICD-10-CM

## 2019-04-22 DIAGNOSIS — K5909 Other constipation: Secondary | ICD-10-CM | POA: Diagnosis not present

## 2019-04-22 DIAGNOSIS — E785 Hyperlipidemia, unspecified: Secondary | ICD-10-CM | POA: Diagnosis not present

## 2019-04-22 DIAGNOSIS — K219 Gastro-esophageal reflux disease without esophagitis: Secondary | ICD-10-CM

## 2019-04-22 DIAGNOSIS — E663 Overweight: Secondary | ICD-10-CM | POA: Diagnosis not present

## 2019-04-22 DIAGNOSIS — Z7982 Long term (current) use of aspirin: Secondary | ICD-10-CM | POA: Diagnosis not present

## 2019-04-22 DIAGNOSIS — I25118 Atherosclerotic heart disease of native coronary artery with other forms of angina pectoris: Secondary | ICD-10-CM

## 2019-04-22 DIAGNOSIS — I10 Essential (primary) hypertension: Secondary | ICD-10-CM | POA: Diagnosis not present

## 2019-04-22 DIAGNOSIS — E039 Hypothyroidism, unspecified: Secondary | ICD-10-CM

## 2019-04-22 DIAGNOSIS — Z0000 Encounter for general adult medical examination without abnormal findings: Secondary | ICD-10-CM

## 2019-04-22 DIAGNOSIS — I209 Angina pectoris, unspecified: Secondary | ICD-10-CM

## 2019-04-22 DIAGNOSIS — E038 Other specified hypothyroidism: Secondary | ICD-10-CM | POA: Diagnosis not present

## 2019-04-22 DIAGNOSIS — Z79890 Hormone replacement therapy (postmenopausal): Secondary | ICD-10-CM | POA: Diagnosis not present

## 2019-04-22 MED ORDER — NITROGLYCERIN 0.4 MG SL SUBL: 0.4000 mg | SUBLINGUAL_TABLET | SUBLINGUAL | 0 refills | Status: DC | PRN

## 2019-04-22 MED ORDER — LISINOPRIL 10 MG PO TABS: 10.0000 mg | ORAL_TABLET | Freq: Every day | ORAL | 3 refills | Status: DC

## 2019-04-22 MED ORDER — LEVOTHYROXINE SODIUM 88 MCG PO TABS: 88.0000 ug | ORAL_TABLET | Freq: Every day | ORAL | 3 refills | Status: DC

## 2019-04-22 MED ORDER — NEBIVOLOL HCL 10 MG PO TABS: 10.0000 mg | ORAL_TABLET | Freq: Every day | ORAL | 3 refills | Status: DC

## 2019-04-22 MED ORDER — ISOSORBIDE MONONITRATE ER 60 MG PO TB24: 60.0000 mg | ORAL_TABLET | Freq: Every day | ORAL | 3 refills | Status: DC

## 2019-04-22 NOTE — Assessment & Plan Note (Signed)
This problem is chronic and controlled.  She states that she now has no symptoms and feels that her prior heartburn symptoms were due to her cardiac disease.  She is on no pharmaceuticals.

## 2019-04-22 NOTE — Assessment & Plan Note (Signed)
This problem is chronic and improved.  She saw the gastroenterologist after Dr. Dutch Quint referred her.  She decided not to proceed with a colonoscopy.  Apparently, he ordered a Cologuard which she says was normal but I cannot see those results.  He recommended a prune a day, increasing her water intake, and walking.  She states that she is having no more constipation, no right lower quadrant pain, and that her stool caliber has normalized.  PLAN : follow

## 2019-04-22 NOTE — Assessment & Plan Note (Signed)
This problem is chronic and well controlled.  She is on Bystolic 10 mg once a day, lisinopril 10 mg once a day, and Imdur 60 once a day.  She states in the past. the physician increased her lisinopril to 20 mg and she felt very poorly.  She checked her blood pressure frequently at home, it was low, and was able to convince the physician to decrease her lisinopril back to 10 mg.  Her blood pressure is at goal according to pretty much any expert guidlines.  She has no side effects to the medications at current doses.  PLAN:  Cont current meds   BP Readings from Last 3 Encounters:  04/22/19 121/74  06/19/18 (!) 162/80  06/09/18 123/65

## 2019-04-22 NOTE — Progress Notes (Signed)
   Subjective:    Patient ID: Vickie [name], female    DOB: 11/23/1940, 78 y.o.   MRN: [id number]  HPI  Vickie [name] is here for hypothyroidism F/U. Please see the A&P for the status of the pt's chronic medical problems.  ROS : per ROS section and in problem oriented charting. All other systems are negative.  PMHx, Soc hx, and / or Fam hx : She lives with her son and [employer].  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  Review of Systems  Constitutional:       6 pound weight gain since October 2019 that does not surprise her  Cardiovascular: Negative for chest pain.  Gastrointestinal: Negative for abdominal pain and constipation.       Stool caliber has normalized       Objective:   Physical Exam Constitutional:      General: She is not in acute distress.    Appearance: Normal appearance. She is not ill-appearing, toxic-appearing or diaphoretic.  HENT:     Head: Normocephalic and atraumatic.     Right Ear: External ear normal.     Left Ear: External ear normal.  Eyes:     General: No scleral icterus.       Right eye: No discharge.        Left eye: No discharge.     Extraocular Movements: Extraocular movements intact.     Conjunctiva/sclera: Conjunctivae normal.  Cardiovascular:     Rate and Rhythm: Normal rate and regular rhythm.     Heart sounds: Normal heart sounds.     Comments: No LE edema Pulmonary:     Effort: Pulmonary effort is normal.     Breath sounds: Normal breath sounds. No rhonchi.     Comments: Decreased breath sounds in the right base Musculoskeletal:        General: No swelling, tenderness, deformity or signs of injury.  Skin:    General: Skin is warm and dry.     Coloration: Skin is not jaundiced.     Findings: No bruising, erythema or rash.  Neurological:     General: No focal deficit present.     Mental Status: She is alert.  Psychiatric:        Mood and Affect: Mood normal.        Behavior: Behavior normal.         Thought Content: Thought content normal.        Judgment: Judgment normal.       Assessment & Plan:

## 2019-04-22 NOTE — Patient Instructions (Signed)
1. I will mail you your results

## 2019-04-22 NOTE — Assessment & Plan Note (Signed)
This problem is chronic and worsened.  When she last saw Dr. Eppie Gibson, she had lost 3 pounds.  Since October of last year, she has gained 6 pounds.  She is not surprised by this because she has increased what she is eaten a bit.  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  She states that she is going to work on this.  PLAN: Follow

## 2019-04-22 NOTE — Assessment & Plan Note (Addendum)
This problem is chronic and controlled.  She was intolerant of statins and is now on Praluent injectable every 2 weeks.  Her most recent lipid panel showed an HDL of 37 and an LDL of 30.  She sees her cardiologist in October who prescribed this medication.  PLAN: Reviewed cardiology's note in October

## 2019-04-22 NOTE — Assessment & Plan Note (Signed)
This problem is chronic and stable.  She is on Synthroid 88 mcg a day.  She has no signs or symptoms of thyroid dysfunction.  We are rechecking a TSH level today.  PLAN:  Cont current meds TSH

## 2019-04-22 NOTE — Assessment & Plan Note (Signed)
She declined Tdap, DEXA scan, and PCV13.

## 2019-04-22 NOTE — Assessment & Plan Note (Signed)
This problem is chronic and stable.  She has an appointment with her cardiologist on October 16.  She is on a beta-blocker, aspirin, and a cholesterol medication.  She had intolerance to statins which caused abdominal pain and she is now on Prialuent injectable every 2 weeks.  At her last appointment with Dr. Eppie Gibson, she had complained of intermittent chest pressure which lasted less than a minute.  It was felt to be atypical and they elected to follow it.  Currently, she states she may get a little bit of pressure but only when she gets upset.  She can exert herself, either going up steps or gardening, without any chest pain.  She cannot remember the last time she used her nitroglycerin.  PLAN:  Cont current meds

## 2019-04-23 LAB — TSH: TSH: 1.06 u[IU]/mL (ref 0.450–4.500)

## 2019-04-23 LAB — BMP8+ANION GAP
Anion Gap: 15 mmol/L (ref 10.0–18.0)
BUN/Creatinine Ratio: 17 (ref 12–28)
BUN: 12 mg/dL (ref 8–27)
CO2: 22 mmol/L (ref 20–29)
Calcium: 9.5 mg/dL (ref 8.7–10.3)
Chloride: 104 mmol/L (ref 96–106)
Creatinine, Ser: 0.7 mg/dL (ref 0.57–1.00)
GFR calc Af Amer: 97 mL/min/{1.73_m2} (ref >59)
GFR calc non Af Amer: 84 mL/min/{1.73_m2} (ref >59)
Glucose: 100 mg/dL — ABNORMAL HIGH (ref 65–99)
Potassium: 4.9 mmol/L (ref 3.5–5.2)
Sodium: 141 mmol/L (ref 134–144)

## 2019-04-30 ENCOUNTER — Ambulatory Visit: Payer: Medicare Other | Admitting: Internal Medicine

## 2019-06-11 NOTE — Progress Notes (Signed)
Cardiology Office Note   Date:  06/12/2019   ID:  Vickie NoeCarolyn M Vancuren, DO [birth year], MRN [id number]  PCP:  Burns SpainButcher, Elizabeth A, MD  Cardiologist:   Chilton Siiffany [place], MD   No chief complaint on file.    History of Present Illness: Vickie [name] is a 78 y.o. female with CAD s/p DES to LAD, hypertension, white coat hypertension, hypothyroidism, GERD and hyperlipidemia who presents for follow up.  In 2017 Vickie [name] reported intermittent chest tightness to her PCP.  Vickie [name] was started on aspirin and Protonix and referred to cardiology.  Vickie [name] underwent cardiac catheterization on 01/06/16 where Vickie [name] was found to have an ulcerated 90% proximal LAD lesion and a 90-95% lesion in OM 1.  Vickie [name] underwent successful implantation of a drug-eluting stent in Vickie LAD and Vickie OM lesion was medically managed, as it was a small caliber vessel.  Vickie [name] was started on nebivolol as Vickie [name] has a history of fatigue on other beta blockers. An echocardiogram on 01/19/16 revealed LVEF 55-60% and grade 1 diastolic dysfunction.  Vickie [name] had recurrent chest discomfort so both nebivolol and Imdur were increased with improvement in her symptoms.  Atorvastatin was discontinued due to transaminitis.  Vickie [name] was switched to rosuvastatin but developed drug induced constipation and R flank pain.  Vickie [name] was unwilling to try other statins.  At her last appointment Vickie [name] BP was elevated, though Vickie [name] reported it was well-controlled at home.  Vickie [name] hasn't been checking it much lately but thinks that it has been OK.  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  Vickie [name] walks her dog but not for long distances.  Vickie [name] has no exertional chest pain or shortness of breath.  Vickie [name] hasn't had any lower extremity edema, orthopnea or PND.  Her only physical complaint is weak legs.  Vickie only time Vickie [name] gets chest pain is when Vickie [name] gets upset but never with exertion.     Past Medical History:  Diagnosis Date  . CAD  (coronary artery disease)   . Chronic diastolic heart failure (HCC) 01/20/2016   Grade I by Echo 01/19/2016  . Chronic urticaria 03/16/2013  . Essential hypertension 08/14/2006  . Gallstones   . Gastroesophageal reflux disease 07/03/2013   Symptoms are not significant enough that [name] wants pharmacologic therapy at this point   . Hyperlipidemia LDL goal < 130 07/03/2013  . Hypothyroidism 08/14/2006  . Overweight (BMI 25.0-29.9) 07/03/2013    Past Surgical History:  Procedure Laterality Date  . CARDIAC CATHETERIZATION N/A 01/06/2016   Procedure: Left Heart Cath and Coronary Angiography;  Surgeon: Thurmon FairMihai Croitoru, MD;  Location: [facility];  Service: Cardiovascular;  Laterality: N/A;  . CARDIAC CATHETERIZATION N/A 01/06/2016   Procedure: Coronary Stent Intervention;  Surgeon: Lennette Biharihomas A Kelly, MD;  Location: [facility];  Service: Cardiovascular;  Laterality: N/A;  . CHOLECYSTECTOMY    . TUBAL LIGATION      Current Outpatient Medications  Medication Sig Dispense Refill  . aspirin 81 MG tablet Take 1 tablet (81 mg total) by mouth daily. 30 tablet 6  . isosorbide mononitrate (IMDUR) 60 MG 24 hr tablet Take 1 tablet (60 mg total) by mouth daily. 90 tablet 3  . levothyroxine (SYNTHROID) 88 MCG tablet Take 1 tablet (88 mcg total) by mouth daily before breakfast. 90 tablet 3  . lisinopril (ZESTRIL) 10 MG tablet Take 1 tablet (10 mg total) by mouth daily. 90 tablet 3  .  nebivolol (BYSTOLIC) 10 MG tablet Take 1 tablet (10 mg total) by mouth daily. KE 90 tablet 3  . nitroGLYCERIN (NITROSTAT) 0.4 MG SL tablet Place 1 tablet (0.4 mg total) under Vickie tongue every 5 (five) minutes as needed for chest pain. 90 tablet 0  . polyethylene glycol (MIRALAX / GLYCOLAX) packet Take 17 g by mouth daily as needed for moderate constipation. 100 each 3  . PRALUENT 75 MG/ML SOAJ INJECT Vickie CONTENTS OF 1 PEN UNDER Vickie SKIN EVERY 14 DAYS 2 mL 6   No current facility-administered medications for this visit.      Allergies:   Atorvastatin and Rosuvastatin    Social History:  Vickie [name]  reports that Vickie [name] has never smoked. Vickie [name] has never used smokeless tobacco. Vickie [name] reports that Vickie [name] does not drink alcohol or use drugs.   Family History:  Vickie [name]'s family history includes Bladder Cancer in her maternal aunt and son; Breast cancer in her sister; Healthy in her brother, sister, sister, son, and son; Heart disease in her mother; Lung cancer in her sister; Unexplained death in her father.    ROS:  Please see Vickie history of present illness.   Otherwise, review of systems are positive for headache.   All other systems are reviewed and negative.    PHYSICAL EXAM: VS:  BP (!) 170/78   Pulse 61   Temp (!) 97.3 F (36.3 C)   Ht 5' (1.524 m)   Wt 153 lb 6.4 oz (69.6 kg)   LMP 10/20/1991   SpO2 96%   BMI 29.96 kg/m  , BMI Body mass index is 29.96 kg/m. GENERAL:  Well appearing HEENT: Pupils equal round and reactive, fundi not visualized, oral mucosa unremarkable NECK:  No jugular venous distention, waveform within normal limits, carotid upstroke brisk and symmetric, no bruits LUNGS:  Clear to auscultation bilaterally HEART:  RRR.  PMI not displaced or sustained,S1 and S2 within normal limits, no S3, no S4, no clicks, no rubs, no murmurs ABD:  Flat, positive bowel sounds normal in frequency in pitch, no bruits, no rebound, no guarding, no midline pulsatile mass, no hepatomegaly, no splenomegaly EXT:  2 plus pulses throughout, no edema, no cyanosis no clubbing SKIN:  No rashes no nodules NEURO:  Cranial nerves II through XII grossly intact, motor grossly intact throughout PSYCH:  Cognitively intact, oriented to person place and time   EKG:  EKG is ordered today. Vickie ekg ordered 01/26/16 demonstrates sinus rhythm rate 73 bpm.  Incomplete RBBB.  08/16/16: sinus rhythm.  Rate 68 bpm.  04/24/17: Sinus rhythm. Rate 73 bpm. PVC. Poor R wave progression. Low voltage. 04/03/18: Sinus rhythm.  Rate 66 bpm.   Low voltage.  Poor R wave progression.  Incomplete RBBB 06/12/19: Sinus rhythm.  Rate 62 bpm.   Echo 01/19/16: Study Conclusions  - Left ventricle: Vickie cavity size was normal. Wall thickness was  normal. Systolic function was normal. Vickie estimated ejection  fraction was in Vickie range of 55% to 60%. Wall motion was normal;  there were no regional wall motion abnormalities. Doppler  parameters are consistent with abnormal left ventricular  relaxation (grade 1 diastolic dysfunction). - Left atrium: Vickie atrium was mildly dilated. - Pericardium, extracardiac: A trivial pericardial effusion was  identified.  LHC 01/06/16:  Angiographic Findings:  1. Vickie left main coronary artery bifurcates in Vickie usual fashion into Vickie left anterior descending artery and left circumflex coronary artery. Vickie vessel is moderately calcified. There is a 25-30% stenosis in Vickie  distal left main artery. 2. Vickie left anterior descending artery is a large vessel that reaches Vickie apex and generates 2 major diagonal branches. Vickie proximal vessel is moderately calcified. There is an ulcerated proximal LAD stenosis up to 90% in severity 3. Vickie left circumflex coronary artery is a medium-size vessel non dominant vessel that generates two major oblique marginal arteries. There is a 90-95% stenosis in Vickie first OM branch, but this vessel is well under 2 mm in caliber. 4. Vickie right coronary artery is a medium-size dominant vessel that generates a single branch posterior lateral ventricular system as well as Vickie PDA. There is evidence of minima luminal irregularities and mild calcification. No hemodynamically meaningful stenoses are seen.  5. Vickie left ventricle is normal in size. Vickie left ventricle systolic function is normal with an estimated ejection fraction of 60-65%. Regional wall motion abnormalities are not seen. No left ventricular thrombus is seen. There is no mitral insufficiency. Vickie ascending aorta appears normal.  There is no aortic valve stenosis by pullback. Vickie left ventricular end-diastolic pressure is normal.    Recent Labs: 04/22/2019: BUN 12; Creatinine, Ser 0.70; Potassium 4.9; Sodium 141; TSH 1.060    Lipid Panel    Component Value Date/Time   CHOL 98 (L) 06/09/2018 1012   TRIG 154 (H) 06/09/2018 1012   HDL 37 (L) 06/09/2018 1012   CHOLHDL 2.6 06/09/2018 1012   CHOLHDL 2.8 08/16/2016 0859   VLDL 21 08/16/2016 0859   LDLCALC 30 06/09/2018 1012      Wt Readings from Last 3 Encounters:  06/12/19 153 lb 6.4 oz (69.6 kg)  04/22/19 150 lb 1.6 oz (68.1 kg)  06/19/18 144 lb 2 oz (65.4 kg)      ASSESSMENT PLAN:  # CAD s/p LAD PCI: # Hyperlipidemia: Vickie [name] is doing well.  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  Vickie only time Vickie [name] has chest discomfort is with emotional stress.  Vickie [name] will come back for fasting lipids and a CMP.  Continue aspirin, isosorbide mononitrate, lisinopril, nebivolol, and Praluent.  # Hypertension:  # White Coat Hypertension: BP was initially elevated and even higher on repeat.  Vickie [name] is going to track her blood pressure at home and follow-up with our pharmacist in 2 months.  In Vickie past when we tried increasing her lisinopril due to elevated in office pressures Vickie [name] developed hypotension and did not feel well.  Typically when Vickie [name] checks her blood pressure is well-controlled.    Current medicines are reviewed at length with Vickie [name] today.  Vickie [name] does not have concerns regarding medicines.   Vickie following changes have been made: none  Labs/ tests ordered today include:   Orders Placed This Encounter  Procedures  . Lipid panel  . Comprehensive metabolic panel  . EKG 12-Lead     Disposition:   FU with Haruna Rohlfs C. Duke Salvia, MD, [facility] in 1 year.  PharmD in 2 months.     Signed, Lois Slagel C. Duke Salvia, MD, [facility]  06/12/2019 12:25 PM    [facility]

## 2019-06-12 ENCOUNTER — Encounter: Payer: Self-pay | Admitting: Cardiovascular Disease

## 2019-06-12 ENCOUNTER — Ambulatory Visit (INDEPENDENT_AMBULATORY_CARE_PROVIDER_SITE_OTHER): Payer: Medicare Other | Admitting: Cardiovascular Disease

## 2019-06-12 ENCOUNTER — Other Ambulatory Visit: Payer: Self-pay

## 2019-06-12 VITALS — BP 170/78 | HR 61 | Temp 97.3°F | Ht 60.0 in | Wt 153.4 lb

## 2019-06-12 DIAGNOSIS — Z5181 Encounter for therapeutic drug level monitoring: Secondary | ICD-10-CM | POA: Diagnosis not present

## 2019-06-12 DIAGNOSIS — I209 Angina pectoris, unspecified: Secondary | ICD-10-CM

## 2019-06-12 DIAGNOSIS — I119 Hypertensive heart disease without heart failure: Secondary | ICD-10-CM

## 2019-06-12 DIAGNOSIS — E7800 Pure hypercholesterolemia, unspecified: Secondary | ICD-10-CM

## 2019-06-12 NOTE — Patient Instructions (Signed)
Medication Instructions:  Your physician recommends that you continue on your current medications as directed. Please refer to the Current Medication list given to you today. *If you need a refill on your cardiac medications before your next appointment, please call your pharmacy*  Lab Work: FASTING LP/CMET SOON   If you have labs (blood work) drawn today and your tests are completely normal, you will receive your results only by: Marland Kitchen MyChart Message (if you have MyChart) OR . A paper copy in the mail If you have any lab test that is abnormal or we need to change your treatment, we will call you to review the results.  Testing/Procedures: NONE  Follow-Up: At [facility], you and your health needs are our priority.  As part of our continuing mission to provide you with exceptional heart care, we have created designated Provider Care Teams.  These Care Teams include your primary Cardiologist (physician) and Advanced Practice Providers (APPs -  Physician Assistants and Nurse Practitioners) who all work together to provide you with the care you need, when you need it.  Your next appointment:   12 months You will receive a reminder letter in the mail two months in advance. If you don't receive a letter, please call our office to schedule the follow-up appointment.  Your physician recommends that you schedule a follow-up appointment in: 2 MONTHS WITH PHARM D FOR BLOOD PRESSURE   The format for your next appointment:   In Person  Provider:   You may see DR [facility]  or one of the following Advanced Practice Providers on your designated Care Team:    Kerin Ransom, PA-C  Max, [place]  Coletta Memos, [place]   Other Instructions TRACK YOUR BLOOD PRESSURE AT [place]

## 2019-08-13 ENCOUNTER — Ambulatory Visit (INDEPENDENT_AMBULATORY_CARE_PROVIDER_SITE_OTHER): Payer: Medicare Other | Admitting: Pharmacist

## 2019-08-13 ENCOUNTER — Other Ambulatory Visit: Payer: Self-pay

## 2019-08-13 VITALS — BP 118/70 | HR 82 | Resp 14 | Wt 152.4 lb

## 2019-08-13 DIAGNOSIS — I209 Angina pectoris, unspecified: Secondary | ICD-10-CM

## 2019-08-13 DIAGNOSIS — I10 Essential (primary) hypertension: Secondary | ICD-10-CM

## 2019-08-13 DIAGNOSIS — Z5181 Encounter for therapeutic drug level monitoring: Secondary | ICD-10-CM | POA: Diagnosis not present

## 2019-08-13 DIAGNOSIS — I119 Hypertensive heart disease without heart failure: Secondary | ICD-10-CM | POA: Diagnosis not present

## 2019-08-13 DIAGNOSIS — E7800 Pure hypercholesterolemia, unspecified: Secondary | ICD-10-CM | POA: Diagnosis not present

## 2019-08-13 LAB — COMPREHENSIVE METABOLIC PANEL
ALT: 18 IU/L (ref 0–32)
AST: 21 IU/L (ref 0–40)
Albumin/Globulin Ratio: 2 (ref 1.2–2.2)
Albumin: 4.7 g/dL (ref 3.7–4.7)
Alkaline Phosphatase: 72 IU/L (ref 39–117)
BUN/Creatinine Ratio: 10 — ABNORMAL LOW (ref 12–28)
BUN: 8 mg/dL (ref 8–27)
Bilirubin Total: 0.4 mg/dL (ref 0.0–1.2)
CO2: 22 mmol/L (ref 20–29)
Calcium: 9.9 mg/dL (ref 8.7–10.3)
Chloride: 107 mmol/L — ABNORMAL HIGH (ref 96–106)
Creatinine, Ser: 0.81 mg/dL (ref 0.57–1.00)
GFR calc Af Amer: 81 mL/min/{1.73_m2} (ref >59)
GFR calc non Af Amer: 70 mL/min/{1.73_m2} (ref >59)
Globulin, Total: 2.3 g/dL (ref 1.5–4.5)
Glucose: 99 mg/dL (ref 65–99)
Potassium: 5 mmol/L (ref 3.5–5.2)
Sodium: 143 mmol/L (ref 134–144)
Total Protein: 7 g/dL (ref 6.0–8.5)

## 2019-08-13 LAB — LIPID PANEL
Chol/HDL Ratio: 2.2 ratio (ref 0.0–4.4)
Cholesterol, Total: 91 mg/dL — ABNORMAL LOW (ref 100–199)
HDL: 41 mg/dL (ref >39)
LDL Chol Calc (NIH): 29 mg/dL (ref 0–99)
Triglycerides: 118 mg/dL (ref 0–149)
VLDL Cholesterol Cal: 21 mg/dL (ref 5–40)

## 2019-08-13 NOTE — Progress Notes (Signed)
Patient ID: Vickie [name]                 DOB: September 05, 1940                      MRN: [id number]     HPI: Vickie [name] is a 78 y.o. female referred by Dr. Oval Linsey to HTN clinic. PMH includes hypertension with white coat syndrome, CAD, hyperthyroids, GERD, intermittent chest tightness, and hyperlipidemia.  Patient denies dizziness, swelling, blurry vision or chest pain/tightness. She report compliance with all medication and denies problems with therapy. Per Dr. Oval Linsey notes patient developed symptomatic hypotension with lisinopril dose greater than 10mg  daily.  Current HTN meds:  Lisinopril 10mg  daily Bystolic 10mg  daily  Intolerance: severe fatigue with BB  BP goal: <130/80  Family History: family history includes Bladder Cancer in her maternal aunt and son; Breast cancer in her sister; Healthy in her brother, sister, sister, son, and son; Heart disease in her mother; Lung cancer in her sister; Unexplained death in her father.   Social History: reports that she has never smoked. She has never used smokeless tobacco. She reports that she does not drink alcohol or use drugs.  Diet: low sodium and mainly home cooked meals  Exercise: no formal exercise, lots of yard work   Home BP readings: 20 readings; average 128/72 (range 595-638 systolic); HR range 75-64PPI)  Wt Readings from Last 3 Encounters:  08/13/19 152 lb 6.4 oz (69.1 kg)  06/12/19 153 lb 6.4 oz (69.6 kg)  04/22/19 150 lb 1.6 oz (68.1 kg)   BP Readings from Last 3 Encounters:  08/13/19 118/70  06/12/19 (!) 170/78  04/22/19 121/74   Pulse Readings from Last 3 Encounters:  08/13/19 82  06/12/19 61  04/22/19 69    Renal function: Estimated Creatinine Clearance: 50.4 mL/min (by C-G formula based on SCr of 0.81 mg/dL).  Past Medical History:  Diagnosis Date  . CAD (coronary artery disease)   . Chronic diastolic heart failure ([place]) 01/20/2016   Grade I by Echo 01/19/2016  . Chronic urticaria 03/16/2013  .  Essential hypertension 08/14/2006  . Gallstones   . Gastroesophageal reflux disease 07/03/2013   Symptoms are not significant enough that patient wants pharmacologic therapy at this point   . Hyperlipidemia LDL goal < 130 07/03/2013  . Hypothyroidism 08/14/2006  . Overweight (BMI 25.0-29.9) 07/03/2013    Current Outpatient Medications on File Prior to Visit  Medication Sig Dispense Refill  . aspirin 81 MG tablet Take 1 tablet (81 mg total) by mouth daily. 30 tablet 6  . isosorbide mononitrate (IMDUR) 60 MG 24 hr tablet Take 1 tablet (60 mg total) by mouth daily. 90 tablet 3  . levothyroxine (SYNTHROID) 88 MCG tablet Take 1 tablet (88 mcg total) by mouth daily before breakfast. 90 tablet 3  . lisinopril (ZESTRIL) 10 MG tablet Take 1 tablet (10 mg total) by mouth daily. 90 tablet 3  . nebivolol (BYSTOLIC) 10 MG tablet Take 1 tablet (10 mg total) by mouth daily. KE 90 tablet 3  . nitroGLYCERIN (NITROSTAT) 0.4 MG SL tablet Place 1 tablet (0.4 mg total) under the tongue every 5 (five) minutes as needed for chest pain. 90 tablet 0  . PRALUENT 75 MG/ML SOAJ INJECT THE CONTENTS OF 1 PEN UNDER THE SKIN EVERY 14 DAYS 2 mL 6   No current facility-administered medications on file prior to visit.    Allergies  Allergen Reactions  . Atorvastatin Other (See Comments)  Elevated liver functions numbers  . Rosuvastatin Other (See Comments)    constipation    Blood pressure 118/70, pulse 82, resp. rate 14, weight 152 lb 6.4 oz (69.1 kg), last menstrual period 10/20/1991, SpO2 93 %.  Essential hypertension Blood pressure is well controlled today during OV and at remains controlled at home. Patient is unable to tolerate higher doses of lisinopril d/t hypotension and develops severe fatigue with higher doses of beta blockers.   Will continue current regimen as prescribed, continue positive lifestyle modifications, and monitor BP 2-3 times per week. Plan to follow at HTN clinic as needed if BP  consistently above 140 sytolic.    Roni Friberg Rodriguez-Guzman PharmD, BCPS, CPP [facility] [address] [place] [address] 08/25/2019 4:51 PM

## 2019-08-13 NOTE — Patient Instructions (Addendum)
Return for a  follow up appointment AS NEEDED  Check your blood pressure at home daily (if able) and keep record of the readings.  Take your BP meds as follows: *NO MEDICATION CHANGES*  Bring all of your meds, your BP cuff and your record of home blood pressures to your next appointment.  Exercise as you're able, try to walk approximately 30 minutes per day.  Keep salt intake to a minimum, especially watch canned and prepared boxed foods.  Eat more fresh fruits and vegetables and fewer canned items.  Avoid eating in fast food restaurants.    HOW TO TAKE YOUR BLOOD PRESSURE: . Rest 5 minutes before taking your blood pressure. .  Don't smoke or drink caffeinated beverages for at least 30 minutes before. . Take your blood pressure before (not after) you eat. . Sit comfortably with your back supported and both feet on the floor (don't cross your legs). . Elevate your arm to heart level on a table or a desk. . Use the proper sized cuff. It should fit smoothly and snugly around your bare upper arm. There should be enough room to slip a fingertip under the cuff. The bottom edge of the cuff should be 1 inch above the crease of the elbow. . Ideally, take 3 measurements at one sitting and record the average.    

## 2019-08-25 ENCOUNTER — Encounter: Payer: Self-pay | Admitting: Pharmacist

## 2019-08-25 NOTE — Assessment & Plan Note (Signed)
Blood pressure is well controlled today during OV and at remains controlled at home. Patient is unable to tolerate higher doses of lisinopril d/t hypotension and develops severe fatigue with higher doses of beta blockers.   Will continue current regimen as prescribed, continue positive lifestyle modifications, and monitor BP 2-3 times per week. Plan to follow at HTN clinic as needed if BP consistently above 115 sytolic.

## 2019-09-29 ENCOUNTER — Other Ambulatory Visit: Payer: Self-pay | Admitting: Pharmacist Clinician (PhC)/ Clinical Pharmacy Specialist

## 2019-09-29 MED ORDER — PRALUENT 75 MG/ML ~~LOC~~ SOAJ: 75.0000 mg | SUBCUTANEOUS | 3 refills | Status: DC

## 2020-02-15 ENCOUNTER — Encounter: Payer: Self-pay | Admitting: *Deleted

## 2020-02-22 ENCOUNTER — Encounter: Payer: Self-pay | Admitting: *Deleted

## 2020-04-19 ENCOUNTER — Encounter: Payer: Self-pay | Admitting: *Deleted

## 2020-05-03 ENCOUNTER — Other Ambulatory Visit: Payer: Self-pay | Admitting: Internal Medicine

## 2020-05-03 MED ORDER — ISOSORBIDE MONONITRATE ER 60 MG PO TB24: 60.0000 mg | ORAL_TABLET | Freq: Every day | ORAL | 3 refills | Status: DC

## 2020-05-03 NOTE — Telephone Encounter (Signed)
Next appt scheduled 10/15 with PCP. 

## 2020-05-03 NOTE — Telephone Encounter (Signed)
Need refill on isosorbide mononitrate (IMDUR) 60 MG 24 hr tablet  ;pt contact [phone]   [id number] - [place], [place] - [address] AT [address]OSS FROM [address]

## 2020-05-06 ENCOUNTER — Encounter: Payer: Medicare Other | Admitting: Internal Medicine

## 2020-05-25 ENCOUNTER — Other Ambulatory Visit: Payer: Self-pay | Admitting: Internal Medicine

## 2020-05-25 DIAGNOSIS — I10 Essential (primary) hypertension: Secondary | ICD-10-CM

## 2020-05-25 DIAGNOSIS — E038 Other specified hypothyroidism: Secondary | ICD-10-CM

## 2020-05-25 MED ORDER — LEVOTHYROXINE SODIUM 88 MCG PO TABS: 88.0000 ug | ORAL_TABLET | Freq: Every day | ORAL | 3 refills | Status: DC

## 2020-05-25 MED ORDER — NEBIVOLOL HCL 10 MG PO TABS: 10.0000 mg | ORAL_TABLET | Freq: Every day | ORAL | 3 refills | Status: DC

## 2020-05-25 MED ORDER — LISINOPRIL 10 MG PO TABS: 10.0000 mg | ORAL_TABLET | Freq: Every day | ORAL | 3 refills | Status: DC

## 2020-05-25 NOTE — Telephone Encounter (Signed)
Need refill on lisinopril (ZESTRIL) 10 MG tablet   ;pt contact [phone]   pls refill all medicine that need refill   [id number] - [place], [place] - [address] AT [address] FROM [address]

## 2020-05-25 NOTE — Telephone Encounter (Signed)
Levothyroxine and nebivolol also due for refills.  Her last TSH was drawn on 04/21/20. Her NOV w/ PCP 06/10/20 Thanks, Penne Lash, RN,BSN

## 2020-06-06 ENCOUNTER — Telehealth: Payer: Self-pay

## 2020-06-06 NOTE — Telephone Encounter (Signed)
#  90 with 3 [facility]. Call placed to pharmacy. Vernona Rieger states they have this Rx and will get it ready for her now. Kinnie Feil, BSN, RN-BC

## 2020-06-06 NOTE — Telephone Encounter (Signed)
nebivolol (BYSTOLIC) 10 MG tablet, REFILL REQUEST @  [id number] - [place], [place] - [address] AT [address] Phone:  [phone]  Fax:  [phone]

## 2020-06-10 ENCOUNTER — Encounter: Payer: Self-pay | Admitting: Internal Medicine

## 2020-06-10 ENCOUNTER — Ambulatory Visit (INDEPENDENT_AMBULATORY_CARE_PROVIDER_SITE_OTHER): Payer: Medicare Other | Admitting: Internal Medicine

## 2020-06-10 ENCOUNTER — Other Ambulatory Visit: Payer: Self-pay

## 2020-06-10 VITALS — BP 157/78 | HR 66 | Temp 98.3°F | Ht 60.0 in | Wt 146.5 lb

## 2020-06-10 DIAGNOSIS — K219 Gastro-esophageal reflux disease without esophagitis: Secondary | ICD-10-CM

## 2020-06-10 DIAGNOSIS — K5909 Other constipation: Secondary | ICD-10-CM

## 2020-06-10 DIAGNOSIS — E663 Overweight: Secondary | ICD-10-CM

## 2020-06-10 DIAGNOSIS — M722 Plantar fascial fibromatosis: Secondary | ICD-10-CM | POA: Diagnosis not present

## 2020-06-10 DIAGNOSIS — E038 Other specified hypothyroidism: Secondary | ICD-10-CM

## 2020-06-10 DIAGNOSIS — Z0000 Encounter for general adult medical examination without abnormal findings: Secondary | ICD-10-CM

## 2020-06-10 DIAGNOSIS — Z6825 Body mass index (BMI) 25.0-25.9, adult: Secondary | ICD-10-CM | POA: Diagnosis not present

## 2020-06-10 DIAGNOSIS — I10 Essential (primary) hypertension: Secondary | ICD-10-CM | POA: Diagnosis not present

## 2020-06-10 DIAGNOSIS — E039 Hypothyroidism, unspecified: Secondary | ICD-10-CM | POA: Diagnosis not present

## 2020-06-10 DIAGNOSIS — E785 Hyperlipidemia, unspecified: Secondary | ICD-10-CM

## 2020-06-10 DIAGNOSIS — L508 Other urticaria: Secondary | ICD-10-CM

## 2020-06-10 NOTE — Assessment & Plan Note (Signed)
Doing well today.  BP is elevated in the clinic, which she notes is normal for her.  She has not been able to tolerate higher doses of lisinopril and beta blocker per patient and chart review without severe symptoms.  She is not interested in another therapy as her BP is controlled at home.  This is reasonable.    Plan Continue current regimen of lisinopril and nebivolol.

## 2020-06-10 NOTE — Assessment & Plan Note (Signed)
This problem is chronic and stable.  She has lost 6 pounds in the last year.  Continue to monitor.

## 2020-06-10 NOTE — Patient Instructions (Signed)
Vickie [name] - -   I am glad you are doing so well!  I will get some blood work today and your thyroid medication was refilled on 9/29.  Please call us if your pharmacy does not have it.   We will see you in 1 year or sooner if you need to come in.   Thank you!

## 2020-06-10 NOTE — Assessment & Plan Note (Signed)
No current issue.  Will continue to monitor.

## 2020-06-10 NOTE — Assessment & Plan Note (Signed)
We discussed screening exams today and immunizations.  She is not interested in either at this time.  Will continue to address and offer these options to her.

## 2020-06-10 NOTE — Assessment & Plan Note (Signed)
Reporting no symptoms of fatigue, weight loss/gain or decreased exercise tolerance.  She has no complaints today.  No palpitations or chest pain.  Her last TSH was at goal.   Will plan to recheck again.   Plan Current dose of synthroid Rx was sent to the pharmacy on 9/29 of this year for a year supply Check TSH.

## 2020-06-10 NOTE — Assessment & Plan Note (Signed)
Constipation is improved.  She has no change to stool caliber.  She is not interested in colon cancer screening at this time.  Continue to monitor.

## 2020-06-10 NOTE — Progress Notes (Signed)
   Subjective:    Patient ID: [name], female    DOB: 08/08/1941, 79 y.o.   MRN: [id number]  CC: 1 year follow up for HTN, hypothyroidism  HPI   Ms. Bruso is a 79 year old woman with PMH of HTN, CAD, GERD, HLD and hypothyroidism who presents for follow up.  This is my first time meeting Ms. Noralyn Pick.  She was last seen about 14 months ago by Dr. Rogelia Boga.   Ms. Keisler reports that she is doing relatively well.  She has no complaints.  She denies any symptoms.  She states she only comes in to get her thyroid checked and refilled.  She has a cardiologist who manages her BP, HLD and CAD.  Her BP is elevated today and she notes that it always is in the clinic.  She has been given too much antihypertensive in the past which made her feel ill and drove her BP down too low.  She is comfortable with her current dose and has no symptoms.   She notes occasional chest tightness with emotional distress, but otherwise has no chest pain or issues with activity.  She is able to garden and work outside without difficulty.  She has an appointment with her Cardiologist on Wednesday.     Review of Systems  Constitutional: Negative for activity change and fatigue.  Respiratory: Negative for chest tightness and shortness of breath.   Cardiovascular: Negative for chest pain and palpitations.  Gastrointestinal: Negative for constipation and rectal pain.  Musculoskeletal: Negative for arthralgias.  Neurological: Negative for dizziness and weakness.       Objective:   Physical Exam Vitals and nursing note reviewed.  Constitutional:      General: She is not in acute distress.    Appearance: She is not ill-appearing or toxic-appearing.  HENT:     Head: Normocephalic and atraumatic.  Eyes:     General: No scleral icterus.       Right eye: No discharge.        Left eye: No discharge.  Cardiovascular:     Rate and Rhythm: Normal rate and regular rhythm.     Heart sounds: No murmur heard.    Pulmonary:     Effort: Pulmonary effort is normal. No respiratory distress.     Breath sounds: Normal breath sounds.  Abdominal:     General: Abdomen is flat.  Skin:    General: Skin is warm and dry.  Neurological:     General: No focal deficit present.     Mental Status: She is alert.  Psychiatric:        Mood and Affect: Mood normal.        Behavior: Behavior normal.     TSH, CMET and CBC today.       Assessment & Plan:  Follow up in 1 year, sooner if needed.

## 2020-06-10 NOTE — Assessment & Plan Note (Signed)
LDL is very well controlled at 29.  She is intolerant of statins and is on praluent.  She will continue to have her lipids managed by her cardiologist and we will continue to monitor.

## 2020-06-10 NOTE — Assessment & Plan Note (Signed)
She is doing well with this!  Resolve.

## 2020-06-12 LAB — CMP14 + ANION GAP
ALT: 16 IU/L (ref 0–32)
AST: 21 IU/L (ref 0–40)
Albumin/Globulin Ratio: 1.8 (ref 1.2–2.2)
Albumin: 4.5 g/dL (ref 3.7–4.7)
Alkaline Phosphatase: 71 IU/L (ref 44–121)
Anion Gap: 14 mmol/L (ref 10.0–18.0)
BUN/Creatinine Ratio: 10 — ABNORMAL LOW (ref 12–28)
BUN: 9 mg/dL (ref 8–27)
Bilirubin Total: 0.2 mg/dL (ref 0.0–1.2)
CO2: 23 mmol/L (ref 20–29)
Calcium: 9.4 mg/dL (ref 8.7–10.3)
Chloride: 106 mmol/L (ref 96–106)
Creatinine, Ser: 0.87 mg/dL (ref 0.57–1.00)
GFR calc Af Amer: 74 mL/min/{1.73_m2} (ref >59)
GFR calc non Af Amer: 64 mL/min/{1.73_m2} (ref >59)
Globulin, Total: 2.5 g/dL (ref 1.5–4.5)
Glucose: 101 mg/dL — ABNORMAL HIGH (ref 65–99)
Potassium: 4.6 mmol/L (ref 3.5–5.2)
Sodium: 143 mmol/L (ref 134–144)
Total Protein: 7 g/dL (ref 6.0–8.5)

## 2020-06-12 LAB — CBC
Hematocrit: 43.1 % (ref 34.0–46.6)
Hemoglobin: 14.1 g/dL (ref 11.1–15.9)
MCH: 31 pg (ref 26.6–33.0)
MCHC: 32.7 g/dL (ref 31.5–35.7)
MCV: 95 fL (ref 79–97)
Platelets: 330 10*3/uL (ref 150–450)
RBC: 4.55 x10E6/uL (ref 3.77–5.28)
RDW: 12 % (ref 11.7–15.4)
WBC: 7.8 10*3/uL (ref 3.4–10.8)

## 2020-06-12 LAB — TSH: TSH: 0.775 u[IU]/mL (ref 0.450–4.500)

## 2020-06-13 ENCOUNTER — Encounter: Payer: Self-pay | Admitting: Internal Medicine

## 2020-06-13 ENCOUNTER — Telehealth: Payer: Self-pay | Admitting: Cardiovascular Disease

## 2020-06-13 NOTE — Progress Notes (Signed)
CSW referred to assist patient with transportation to upcoming appointment at [facility] on Wednesday June 15, 2020 at 10am. CSW contacted [facility] to make arrangements for transport. Lasandra Beech, LCSW, CCSW-MCS [phone]

## 2020-06-13 NOTE — Telephone Encounter (Signed)
Pt called in and stated that she is having issue with Transportation.  She has an appt Wed and would like to know if she could get help with transportation to that appt ?  Pt stated she had an appt with another [facility] last week and they arranged [facility] service for her to get to her appt.  She would like to know if this could be arranged for her appt wed with Dr Duke Salvia?      Best number  [phone]

## 2020-06-15 ENCOUNTER — Ambulatory Visit (INDEPENDENT_AMBULATORY_CARE_PROVIDER_SITE_OTHER): Payer: Medicare Other | Admitting: Cardiovascular Disease

## 2020-06-15 ENCOUNTER — Other Ambulatory Visit: Payer: Self-pay

## 2020-06-15 ENCOUNTER — Encounter: Payer: Self-pay | Admitting: Cardiovascular Disease

## 2020-06-15 VITALS — BP 140/72 | HR 86 | Ht 60.0 in | Wt 146.4 lb

## 2020-06-15 DIAGNOSIS — I10 Essential (primary) hypertension: Secondary | ICD-10-CM | POA: Diagnosis not present

## 2020-06-15 DIAGNOSIS — I2510 Atherosclerotic heart disease of native coronary artery without angina pectoris: Secondary | ICD-10-CM | POA: Diagnosis not present

## 2020-06-15 DIAGNOSIS — E7800 Pure hypercholesterolemia, unspecified: Secondary | ICD-10-CM | POA: Diagnosis not present

## 2020-06-15 NOTE — Patient Instructions (Signed)
Medication Instructions:  Your physician recommends that you continue on your current medications as directed. Please refer to the Current Medication list given to you today.  *If you need a refill on your cardiac medications before your next appointment, please call your pharmacy*  Lab Work: NONE   Testing/Procedures: NONE  Follow-Up: At [facility], you and your health needs are our priority.  As part of our continuing mission to provide you with exceptional heart care, we have created designated Provider Care Teams.  These Care Teams include your primary Cardiologist (physician) and Advanced Practice Providers (APPs -  Physician Assistants and Nurse Practitioners) who all work together to provide you with the care you need, when you need it.  We recommend signing up for the patient portal called "MyChart".  Sign up information is provided on this After Visit Summary.  MyChart is used to connect with patients for Virtual Visits (Telemedicine).  Patients are able to view lab/test results, encounter notes, upcoming appointments, etc.  Non-urgent messages can be sent to your provider as well.   To learn more about what you can do with MyChart, go to [email or web address].    Your next appointment:   6 month(s) You will receive a reminder letter in the mail two months in advance. If you don't receive a letter, please call our office to schedule the follow-up appointment.  The format for your next appointment:   In Person  Provider:   You may see DR [facility]  or one of the following Advanced Practice Providers on your designated Care Team:    Corine Shelter, PA-C  [place], [place]  Edd Fabian, [place]  Other Instructions  AFTER YOU GET YOUR BLOOD PRESSURE MACHINE MONITOR AND LOG YOUR BLOOD PRESSURE A FEW TIMES A WEEK. BRING READINGS TO YOUR FOLLOW UP

## 2020-06-15 NOTE — Progress Notes (Signed)
Cardiology Office Note   Date:  06/15/2020   ID:  Vickie FREGEAU, DO 1941/06/01, MRN [id number]  PCP:  Inez Catalina, MD  Cardiologist:   Chilton Si, MD   No chief complaint on file.    History of Present Illness: Vickie [name] is a 79 y.o. female with CAD s/p DES to LAD, hypertension, white coat hypertension, hypothyroidism, GERD and hyperlipidemia who presents for follow up.  In 2017 Vickie [name] reported intermittent chest tightness to her PCP.  She was started on aspirin and Protonix and referred to cardiology.  She underwent cardiac catheterization on 01/06/16 where she was found to have an ulcerated 90% proximal LAD lesion and a 90-95% lesion in OM 1.  She underwent successful implantation of a drug-eluting stent in the LAD and the OM lesion was medically managed, as it was a small caliber vessel.  She was started on nebivolol as she has a history of fatigue on other beta blockers. An echocardiogram on 01/19/16 revealed LVEF 55-60% and grade 1 diastolic dysfunction.  She had recurrent chest discomfort so both nebivolol and Imdur were increased with improvement in her symptoms.  Atorvastatin was discontinued due to transaminitis.  She was switched to rosuvastatin but developed drug induced constipation and R flank pain.  She was unwilling to try other statins and started Praluent.  At her last appointment Vickie [name]'s BP was elevated, though she reported it was well-controlled at home.  We asked her to check her blood pressures at home and bring to follow up.  We have been making this request several times but she does not have a blood pressure cuff at home and forgets to check it when she sees her son.  She notes that she no longer is driving and this makes it harder for her to get up and get things.  She continues to be very physically active has no exertional symptoms.  She has no LE edema, orthopnea or PND.  She notes that when she took her Praluent shot last week she  developed cold symptoms for a few days afterwards.  This was the first time she had any significant symptoms.  She is pretty sure it was the injection and not Covid, as she has not had any known exposures.  Her symptoms improved after a day or two.  Past Medical History:  Diagnosis Date  . CAD (coronary artery disease)   . Chronic diastolic heart failure (HCC) 01/20/2016   Grade I by Echo 01/19/2016  . Chronic urticaria 03/16/2013  . Essential hypertension 08/14/2006  . Gallstones   . Gastroesophageal reflux disease 07/03/2013   Symptoms are not significant enough that patient wants pharmacologic therapy at this point   . Hyperlipidemia LDL goal < 130 07/03/2013  . Hypothyroidism 08/14/2006  . Overweight (BMI 25.0-29.9) 07/03/2013    Past Surgical History:  Procedure Laterality Date  . CARDIAC CATHETERIZATION N/A 01/06/2016   Procedure: Left Heart Cath and Coronary Angiography;  Surgeon: Thurmon Fair, MD;  Location: [facility] INVASIVE CV LAB;  Service: Cardiovascular;  Laterality: N/A;  . CARDIAC CATHETERIZATION N/A 01/06/2016   Procedure: Coronary Stent Intervention;  Surgeon: Lennette Bihari, MD;  Location: [facility] INVASIVE CV LAB;  Service: Cardiovascular;  Laterality: N/A;  . CHOLECYSTECTOMY    . TUBAL LIGATION      Current Outpatient Medications  Medication Sig Dispense Refill  . Alirocumab (PRALUENT) 75 MG/ML SOAJ Inject 75 mg into the skin every 14 (fourteen) days. 6 mL 3  .  aspirin 81 MG tablet Take 1 tablet (81 mg total) by mouth daily. 30 tablet 6  . isosorbide mononitrate (IMDUR) 60 MG 24 hr tablet Take 1 tablet (60 mg total) by mouth daily. 90 tablet 3  . levothyroxine (SYNTHROID) 88 MCG tablet Take 1 tablet (88 mcg total) by mouth daily before breakfast. 90 tablet 3  . lisinopril (ZESTRIL) 10 MG tablet Take 1 tablet (10 mg total) by mouth daily. 90 tablet 3  . nebivolol (BYSTOLIC) 10 MG tablet Take 1 tablet (10 mg total) by mouth daily. KE 90 tablet 3  . nitroGLYCERIN (NITROSTAT) 0.4 MG  SL tablet Place 1 tablet (0.4 mg total) under the tongue every 5 (five) minutes as needed for chest pain. 90 tablet 0   No current facility-administered medications for this visit.    Allergies:   Atorvastatin and Rosuvastatin    Social History:  The patient  reports that she has never smoked. She has never used smokeless tobacco. She reports that she does not drink alcohol and does not use drugs.   Family History:  The patient's family history includes Bladder Cancer in her maternal aunt and son; Breast cancer in her sister; Healthy in her brother, sister, sister, son, and son; Heart disease in her mother; Lung cancer in her sister; Unexplained death in her father.    ROS:  Please see the history of present illness.   Otherwise, review of systems are positive for headache.   All other systems are reviewed and negative.    PHYSICAL EXAM: VS:  BP 140/72   Pulse 86   Ht 5' (1.524 m)   Wt 146 lb 6.4 oz (66.4 kg)   LMP 10/20/1991   SpO2 96%   BMI 28.59 kg/m  , BMI Body mass index is 28.59 kg/m. GENERAL:  Well appearing HEENT: Pupils equal round and reactive, fundi not visualized, oral mucosa unremarkable NECK:  No jugular venous distention, waveform within normal limits, carotid upstroke brisk and symmetric, no bruits LUNGS:  Clear to auscultation bilaterally HEART:  RRR.  PMI not displaced or sustained,S1 and S2 within normal limits, no S3, no S4, no clicks, no rubs, no murmurs ABD:  Flat, positive bowel sounds normal in frequency in pitch, no bruits, no rebound, no guarding, no midline pulsatile mass, no hepatomegaly, no splenomegaly EXT:  2 plus pulses throughout, no edema, no cyanosis no clubbing SKIN:  No rashes no nodules NEURO:  Cranial nerves II through XII grossly intact, motor grossly intact throughout PSYCH:  Cognitively intact, oriented to person place and time   EKG:  EKG is ordered today. The ekg ordered 01/26/16 demonstrates sinus rhythm rate 73 bpm.  Incomplete RBBB.    08/16/16: sinus rhythm.  Rate 68 bpm.  04/24/17: Sinus rhythm. Rate 73 bpm. PVC. Poor R wave progression. Low voltage. 04/03/18: Sinus rhythm.  Rate 66 bpm.  Low voltage.  Poor R wave progression.  Incomplete RBBB 06/12/19: Sinus rhythm.  Rate 62 bpm.  06/15/2020: Sinus rhythm.  Rate 86 bpm.  Low voltage.  Incomplete right bundle branch block.  Echo 01/19/16: Study Conclusions  - Left ventricle: The cavity size was normal. Wall thickness was  normal. Systolic function was normal. The estimated ejection  fraction was in the range of 55% to 60%. Wall motion was normal;  there were no regional wall motion abnormalities. Doppler  parameters are consistent with abnormal left ventricular  relaxation (grade 1 diastolic dysfunction). - Left atrium: The atrium was mildly dilated. - Pericardium, extracardiac: A trivial  pericardial effusion was  identified.  LHC 01/06/16:  Angiographic Findings:  1. The left main coronary artery bifurcates in the usual fashion into the left anterior descending artery and left circumflex coronary artery. The vessel is moderately calcified. There is a 25-30% stenosis in the distal left main artery. 2. The left anterior descending artery is a large vessel that reaches the apex and generates 2 major diagonal branches. The proximal vessel is moderately calcified. There is an ulcerated proximal LAD stenosis up to 90% in severity 3. The left circumflex coronary artery is a medium-size vessel non dominant vessel that generates two major oblique marginal arteries. There is a 90-95% stenosis in the first OM branch, but this vessel is well under 2 mm in caliber. 4. The right coronary artery is a medium-size dominant vessel that generates a single branch posterior lateral ventricular system as well as the PDA. There is evidence of minima luminal irregularities and mild calcification. No hemodynamically meaningful stenoses are seen.  5. The left ventricle is normal in size.  The left ventricle systolic function is normal with an estimated ejection fraction of 60-65%. Regional wall motion abnormalities are not seen. No left ventricular thrombus is seen. There is no mitral insufficiency. The ascending aorta appears normal. There is no aortic valve stenosis by pullback. The left ventricular end-diastolic pressure is normal.    Recent Labs: 06/10/2020: ALT 16; BUN 9; Creatinine, Ser 0.87; Hemoglobin 14.1; Platelets 330; Potassium 4.6; Sodium 143; TSH 0.775    Lipid Panel    Component Value Date/Time   CHOL 91 (L) 08/13/2019 0920   TRIG 118 08/13/2019 0920   HDL 41 08/13/2019 0920   CHOLHDL 2.2 08/13/2019 0920   CHOLHDL 2.8 08/16/2016 0859   VLDL 21 08/16/2016 0859   LDLCALC 29 08/13/2019 0920      Wt Readings from Last 3 Encounters:  06/15/20 146 lb 6.4 oz (66.4 kg)  06/10/20 146 lb 8 oz (66.5 kg)  08/13/19 152 lb 6.4 oz (69.1 kg)      ASSESSMENT PLAN:  # CAD s/p LAD PCI: # Hyperlipidemia:  Vickie [name] is doing well.  She continues to be very active and has no chest pain.  The only time she has chest discomfort is with emotional stress.  Continue aspirin, Imdur, nebivolol, and Praluent.  We will repeat lipids and a CMP at her follow-up.  She did have some cold-like symptoms after her last Praluent injection.  We will continue to monitor for now.  # Hypertension:  # White Coat Hypertension: BP has been elevated in clinic.  In the past and we tried to increase her lisinopril she became hypotensive at home.  She has been unable to monitor it because she does not have a blood pressure cuff at home.  We will provide 1 for her today.  Continue nebivolol, lisinopril, and Imdur for now.   Current medicines are reviewed at length with the patient today.  The patient does not have concerns regarding medicines.   The following changes have been made: none  Labs/ tests ordered today include:   Orders Placed This Encounter  Procedures  . EKG 12-Lead      Disposition:   FU with Analina Filla C. Duke Salvia, MD, [facility] in 6 months    Signed, Texanna Hilburn C. Duke Salvia, MD, [facility]  06/15/2020 10:20 AM    [facility]

## 2020-06-16 ENCOUNTER — Telehealth: Payer: Self-pay | Admitting: Licensed Clinical Social Worker

## 2020-06-16 NOTE — Telephone Encounter (Signed)
CSW referred to assist patient with obtaining a BP cuff. CSW contacted patient to inform cuff will be delivered to home. Patient grateful for support and assistance. CSW available as needed. Jackie Kelley Knoth, LCSW, CCSW-MCS [phone]  

## 2020-06-21 ENCOUNTER — Inpatient Hospital Stay (HOSPITAL_BASED_OUTPATIENT_CLINIC_OR_DEPARTMENT_OTHER)
Admission: EM | Admit: 2020-06-21 | Discharge: 2020-06-30 | DRG: 177 | Disposition: A | Discharge status: Home Health | Payer: Medicare Other | Attending: Internal Medicine | Admitting: Internal Medicine

## 2020-06-21 ENCOUNTER — Encounter (HOSPITAL_BASED_OUTPATIENT_CLINIC_OR_DEPARTMENT_OTHER): Payer: Self-pay | Admitting: *Deleted

## 2020-06-21 ENCOUNTER — Other Ambulatory Visit: Payer: Self-pay

## 2020-06-21 ENCOUNTER — Emergency Department (HOSPITAL_BASED_OUTPATIENT_CLINIC_OR_DEPARTMENT_OTHER): Payer: Medicare Other

## 2020-06-21 DIAGNOSIS — Z888 Allergy status to other drugs, medicaments and biological substances status: Secondary | ICD-10-CM

## 2020-06-21 DIAGNOSIS — Z79899 Other long term (current) drug therapy: Secondary | ICD-10-CM

## 2020-06-21 DIAGNOSIS — E861 Hypovolemia: Secondary | ICD-10-CM | POA: Diagnosis present

## 2020-06-21 DIAGNOSIS — J9601 Acute respiratory failure with hypoxia: Secondary | ICD-10-CM | POA: Diagnosis present

## 2020-06-21 DIAGNOSIS — E875 Hyperkalemia: Secondary | ICD-10-CM | POA: Diagnosis not present

## 2020-06-21 DIAGNOSIS — I110 Hypertensive heart disease with heart failure: Secondary | ICD-10-CM | POA: Diagnosis present

## 2020-06-21 DIAGNOSIS — L89311 Pressure ulcer of right buttock, stage 1: Secondary | ICD-10-CM | POA: Diagnosis present

## 2020-06-21 DIAGNOSIS — E876 Hypokalemia: Secondary | ICD-10-CM | POA: Diagnosis not present

## 2020-06-21 DIAGNOSIS — L8990 Pressure ulcer of unspecified site, unspecified stage: Secondary | ICD-10-CM | POA: Insufficient documentation

## 2020-06-21 DIAGNOSIS — J1282 Pneumonia due to coronavirus disease 2019: Secondary | ICD-10-CM | POA: Diagnosis not present

## 2020-06-21 DIAGNOSIS — Z79890 Hormone replacement therapy (postmenopausal): Secondary | ICD-10-CM

## 2020-06-21 DIAGNOSIS — E039 Hypothyroidism, unspecified: Secondary | ICD-10-CM | POA: Diagnosis present

## 2020-06-21 DIAGNOSIS — K219 Gastro-esophageal reflux disease without esophagitis: Secondary | ICD-10-CM | POA: Diagnosis present

## 2020-06-21 DIAGNOSIS — R0602 Shortness of breath: Secondary | ICD-10-CM

## 2020-06-21 DIAGNOSIS — E871 Hypo-osmolality and hyponatremia: Secondary | ICD-10-CM

## 2020-06-21 DIAGNOSIS — I10 Essential (primary) hypertension: Secondary | ICD-10-CM | POA: Diagnosis present

## 2020-06-21 DIAGNOSIS — I2510 Atherosclerotic heart disease of native coronary artery without angina pectoris: Secondary | ICD-10-CM | POA: Diagnosis present

## 2020-06-21 DIAGNOSIS — E785 Hyperlipidemia, unspecified: Secondary | ICD-10-CM | POA: Diagnosis present

## 2020-06-21 DIAGNOSIS — Z7982 Long term (current) use of aspirin: Secondary | ICD-10-CM

## 2020-06-21 DIAGNOSIS — I5032 Chronic diastolic (congestive) heart failure: Secondary | ICD-10-CM | POA: Diagnosis not present

## 2020-06-21 DIAGNOSIS — Z9049 Acquired absence of other specified parts of digestive tract: Secondary | ICD-10-CM

## 2020-06-21 DIAGNOSIS — U071 COVID-19: Secondary | ICD-10-CM | POA: Diagnosis present

## 2020-06-21 LAB — RESPIRATORY PANEL BY RT PCR (FLU A&B, COVID)
Influenza A by PCR: NEGATIVE
Influenza B by PCR: NEGATIVE
SARS Coronavirus 2 by RT PCR: POSITIVE — AB

## 2020-06-21 LAB — BASIC METABOLIC PANEL
Anion gap: 11 (ref 5–15)
BUN: 9 mg/dL (ref 8–23)
CO2: 23 mmol/L (ref 22–32)
Calcium: 7.9 mg/dL — ABNORMAL LOW (ref 8.9–10.3)
Chloride: 92 mmol/L — ABNORMAL LOW (ref 98–111)
Creatinine, Ser: 0.79 mg/dL (ref 0.44–1.00)
GFR, Estimated: >60 mL/min (ref >60)
Glucose, Bld: 145 mg/dL — ABNORMAL HIGH (ref 70–99)
Potassium: 3.2 mmol/L — ABNORMAL LOW (ref 3.5–5.1)
Sodium: 126 mmol/L — ABNORMAL LOW (ref 135–145)

## 2020-06-21 LAB — CBC WITH DIFFERENTIAL/PLATELET
Abs Immature Granulocytes: 0.03 10*3/uL (ref 0.00–0.07)
Basophils Absolute: 0 10*3/uL (ref 0.0–0.1)
Basophils Relative: 0 %
Eosinophils Absolute: 0 10*3/uL (ref 0.0–0.5)
Eosinophils Relative: 0 %
HCT: 38.5 % (ref 36.0–46.0)
Hemoglobin: 13.5 g/dL (ref 12.0–15.0)
Immature Granulocytes: 1 %
Lymphocytes Relative: 17 %
Lymphs Abs: 1 10*3/uL (ref 0.7–4.0)
MCH: 31.3 pg (ref 26.0–34.0)
MCHC: 35.1 g/dL (ref 30.0–36.0)
MCV: 89.1 fL (ref 80.0–100.0)
Monocytes Absolute: 0.3 10*3/uL (ref 0.1–1.0)
Monocytes Relative: 6 %
Neutro Abs: 4.5 10*3/uL (ref 1.7–7.7)
Neutrophils Relative %: 76 %
Platelets: 215 10*3/uL (ref 150–400)
RBC: 4.32 MIL/uL (ref 3.87–5.11)
RDW: 12.5 % (ref 11.5–15.5)
Smear Review: NORMAL
WBC: 5.8 10*3/uL (ref 4.0–10.5)
nRBC: 0 % (ref 0.0–0.2)

## 2020-06-21 LAB — LACTIC ACID, PLASMA: Lactic Acid, Venous: 1.7 mmol/L (ref 0.5–1.9)

## 2020-06-21 MED ORDER — ACETAMINOPHEN 325 MG PO TABS
650.0000 mg | ORAL_TABLET | Freq: Once | ORAL | Status: AC
  Administered 2020-06-21: 650 mg via ORAL
  Filled 2020-06-21: qty 2

## 2020-06-21 MED ORDER — DEXAMETHASONE SODIUM PHOSPHATE 10 MG/ML IJ SOLN
6.0000 mg | Freq: Once | INTRAMUSCULAR | Status: AC
  Administered 2020-06-21: 6 mg via INTRAVENOUS
  Filled 2020-06-21: qty 1

## 2020-06-21 MED ORDER — POTASSIUM CHLORIDE CRYS ER 20 MEQ PO TBCR
40.0000 meq | EXTENDED_RELEASE_TABLET | Freq: Once | ORAL | Status: AC
  Administered 2020-06-21: 40 meq via ORAL
  Filled 2020-06-21: qty 2

## 2020-06-21 MED ORDER — SODIUM CHLORIDE 0.9 % IV SOLN
100.0000 mg | Freq: Every day | INTRAVENOUS | Status: AC
  Administered 2020-06-23 – 2020-06-26 (×4): 100 mg via INTRAVENOUS
  Filled 2020-06-21 (×4): qty 20

## 2020-06-21 MED ORDER — SODIUM CHLORIDE 0.9 % IV SOLN
100.0000 mg | INTRAVENOUS | Status: AC
  Administered 2020-06-21 – 2020-06-22 (×2): 100 mg via INTRAVENOUS
  Filled 2020-06-21 (×2): qty 20

## 2020-06-21 MED ORDER — SODIUM CHLORIDE 0.9 % IV SOLN
INTRAVENOUS | Status: DC | PRN
  Administered 2020-06-21: 1000 mL via INTRAVENOUS

## 2020-06-21 NOTE — H&P (Signed)
Date: 06/22/2020               Patient Name:  Vickie [name] MRN: [id number]  DOB: 1940-09-10 Age / Sex: 79 y.o., female   PCP: Inez Catalina, MD         Medical Service: Internal Medicine Teaching Service         Attending Physician: Dr. Mikey Bussing    First Contact: Dr. Claudette Laws Pager: [phone]  Second Contact: Dr. Sande Brothers Pager: [phone]       After Hours (After 5p/  First Contact Pager: [phone]  weekends / holidays): Second Contact Pager: [phone]   Chief Complaint: shortness of breath  History of Present Illness:   Vickie [name] is a 79 y.o. year old female with hx of CAD s/p DES to LAD in 2017, HFpEF, HTN, HLD, hypothyroidism, GERD presenting for SOB with onset 3 days ago. Also associated body aches and generalized weakness for 1 week. Had a positive home COVID test today. Has associated diarrhea which resolved several days ago, decreased oral intake, chills. Denies rashes, LOC, nausea, vomiting.  ED course: Fever of 101 on arrival. O2 sat of 87% after ambulating, but improved to 91% at rest. On 2L nasal canula. Started dexamethasone and remdesivir. Transferred from [facility] to [facility] for admission.  Meds:  Current Outpatient Medications  Medication Instructions  . aspirin 81 mg, Oral, Daily  . isosorbide mononitrate (IMDUR) 60 mg, Oral, Daily  . levothyroxine (SYNTHROID) 88 mcg, Oral, Daily before breakfast  . lisinopril (ZESTRIL) 10 mg, Oral, Daily  . nebivolol (BYSTOLIC) 10 mg, Oral, Daily, KE  . nitroGLYCERIN (NITROSTAT) 0.4 mg, Sublingual, Every 5 min PRN  . Praluent 75 mg, Subcutaneous, Every 14 days   Allergies: Allergies as of 06/21/2020 - Review Complete 06/21/2020  Allergen Reaction Noted  . Atorvastatin Other (See Comments) 04/24/2017  . Rosuvastatin Other (See Comments) 04/24/2017   Past Medical History:  Diagnosis Date  . CAD (coronary artery disease)   . Chronic diastolic heart failure (HCC) 01/20/2016   Grade I by Echo  01/19/2016  . Chronic urticaria 03/16/2013  . Essential hypertension 08/14/2006  . Gallstones   . Gastroesophageal reflux disease 07/03/2013   Symptoms are not significant enough that patient wants pharmacologic therapy at this point   . Hyperlipidemia LDL goal < 130 07/03/2013  . Hypothyroidism 08/14/2006  . Overweight (BMI 25.0-29.9) 07/03/2013    Family History:  Family History  Problem Relation Age of Onset  . Lung cancer Sister   . Healthy Sister        Unknown health/Unknown health  . Healthy Brother        Unknown health  . Heart disease Mother   . Unexplained death Father        Killed in World War II  . Healthy Son   . Healthy Son   . Bladder Cancer Son   . Breast cancer Sister   . Healthy Sister        Unknown health  . Bladder Cancer Maternal Aunt   . Colon cancer Neg Hx   . Esophageal cancer Neg Hx     Social History:  Social History   Tobacco Use  . Smoking status: Never Smoker  . Smokeless tobacco: Never Used  Vaping Use  . Vaping Use: Never used  Substance Use Topics  . Alcohol use: No  . Drug use: No    Review of Systems: Review of Systems  Constitutional: Positive  for chills and malaise/fatigue. Negative for fever.  Respiratory: Positive for cough and shortness of breath.   Cardiovascular: Negative for chest pain and palpitations.  Gastrointestinal: Positive for diarrhea. Negative for abdominal pain, nausea and vomiting.  Genitourinary: Negative for dysuria and frequency.  Musculoskeletal: Positive for myalgias.  Skin: Negative for itching and rash.  Neurological: Negative for dizziness and loss of consciousness.  All other systems reviewed and are negative.    Physical Exam: Physical Exam Vitals and nursing note reviewed.  Constitutional:      General: She is not in acute distress.    Appearance: She is well-developed. She is not ill-appearing.  HENT:     Head: Normocephalic and atraumatic.     Comments: Hard of hearing    Mouth/Throat:      Mouth: Mucous membranes are moist.  Eyes:     Extraocular Movements: Extraocular movements intact.  Cardiovascular:     Rate and Rhythm: Normal rate and regular rhythm.     Pulses: Normal pulses. No decreased pulses.     Heart sounds: Normal heart sounds. No murmur heard.  No friction rub. No gallop.   Pulmonary:     Effort: Pulmonary effort is normal.     Breath sounds: Normal breath sounds. No decreased breath sounds, wheezing, rhonchi or rales.  Abdominal:     General: Bowel sounds are normal.     Palpations: Abdomen is soft.     Tenderness: There is no abdominal tenderness. There is no guarding or rebound.  Musculoskeletal:     Cervical back: Normal range of motion.     Right lower leg: No tenderness. No edema.     Left lower leg: No tenderness. No edema.  Skin:    General: Skin is warm and dry.  Neurological:     General: No focal deficit present.     Mental Status: She is alert.  Psychiatric:        Mood and Affect: Mood normal.        Behavior: Behavior normal.      EKG: personally reviewed my interpretation is normal sinus rhythm, no STEMI  CXR: personally reviewed my interpretation is mild reticular opacities bilaterally  Assessment & Plan by Problem: Principal Problem:   Pneumonia due to COVID-19 virus Active Problems:   Hypothyroidism   Essential hypertension   Hyponatremia   Vickie [name] is a 79 y.o. year old female with hx of CAD s/p DES to LAD in 2017, HFpEF, HTN, HLD, hypothyroidism, GERD presenting for COVID-19 pneumonia. States she is feeling much better now that she is on oxygen.  COVID-19 pneumonia Presents with SOB, body aches, and generalized weakness. Multiple risk factors for severe disease. O2 saturation on room air was between 91-93%, saturating 96% on 2L. States she is feeling much better now that she is on oxygen. Initial lactic acid 1.7.  -supplemental O2 as needed -start dexamethasone -start remdesivir -f/u PCT, CRP, D-dimer,    -flutter valve, incentive spirometer  Hypovolemic Hyponatremia Na of 126 on admission. In the setting of poor PO intake, and diarrhea which has resolved. Serum osm of 270, urine osm 146, urine Na 14 which are consistent with hypovolemic. -CMP in AM     -start fluids pending Na level  Hypokalemia K of 3.2 on admission. -replete with oral Klor-con -CMP in AM  CAD HFpEF HLD Cath in 2017 with ulcerated 90% proximal LAD lesion, had DES placed. Also had a 90-95% lesion in a small oblique marginal branch which was  managed medically. Echo in 2017 with LVEF 55-60% and grade 1 diastolic dysfunction. She is on Aspirin, Imdur, nebivolol, and Praluent at home. -continue home meds, except resume Praluent at discharge  HTN BP of 160/75 on admission. Home meds of Imdur 60mg , lisinopril 10mg , nebivolol 10mg . -resume home meds  Hypothyroidism -continue home levothyroxine  Dispo: Admit patient to Inpatient with expected length of stay greater than 2 midnights.  Signed: , MD 06/22/2020, 12:24 AM  Pager: [phone]  After 5pm on weekdays and 1pm on weekends: On Call pager: [phone]

## 2020-06-21 NOTE — Progress Notes (Signed)
Patient's resting SPO2 on room air is between 91% and 93%.  Patient's SPO2 dropped to 87% when she ambulated from the waiting room to triage.

## 2020-06-21 NOTE — ED Triage Notes (Addendum)
C/o fatigue body aches x 1 week , SOB x 3 days, Covid + home test , also c/o fall with  Abrasion to right side of face x 3 days ago

## 2020-06-21 NOTE — ED Notes (Signed)
ED Provider at bedside. 

## 2020-06-21 NOTE — Progress Notes (Signed)
Placed patient on 2 liter nasal cannula to maintain a SPO2 > or + 94%.  RT will continue to monitor.

## 2020-06-21 NOTE — ED Provider Notes (Signed)
[facility] Provider Note: Vickie Dell, MD, FACEP  CSN: [id number] MRN: [id number] ARRIVAL: 06/21/20 at 2201 ROOM: MH11/MH11   CHIEF COMPLAINT  Shortness of Breath   HISTORY OF PRESENT ILLNESS  06/21/20 10:58 PM Vickie [name] is a 79 y.o. female with Covid-like symptoms.  Specifically she has had a 1 week history of body aches and general weakness.  She has had 3 days of shortness of breath and cough and tonight she developed a fever.  She has not had loss of taste or smell.  She has not had no vomiting or diarrhea.  Her fever was 101 on arrival and she was given acetaminophen. She tells me she feels confused and is having difficulty recalling what symptoms she has had.  Part of the history was obtained from her son.  He did a home Covid test on her this evening which was positive.  She was noted to have an oxygen saturation of 87% after ambulating into triage but this improved to 91% at rest.  She is tachypneic and tachycardic as well.  She has an abrasion to her right eyebrow which her son attributes to a fall from weakness 3 days ago.  She denies any pain presently.   Past Medical History:  Diagnosis Date  . CAD (coronary artery disease)   . Chronic diastolic heart failure (HCC) 01/20/2016   Grade I by Echo 01/19/2016  . Chronic urticaria 03/16/2013  . Essential hypertension 08/14/2006  . Gallstones   . Gastroesophageal reflux disease 07/03/2013   Symptoms are not significant enough that patient wants pharmacologic therapy at this point   . Hyperlipidemia LDL goal < 130 07/03/2013  . Hypothyroidism 08/14/2006  . Overweight (BMI 25.0-29.9) 07/03/2013    Past Surgical History:  Procedure Laterality Date  . CARDIAC CATHETERIZATION N/A 01/06/2016   Procedure: Left Heart Cath and Coronary Angiography;  Surgeon: Thurmon Fair, MD;  Location: [facility];  Service: Cardiovascular;  Laterality: N/A;  . CARDIAC CATHETERIZATION N/A 01/06/2016   Procedure: Coronary  Stent Intervention;  Surgeon: Lennette Bihari, MD;  Location: [facility] CV [facility];  Service: Cardiovascular;  Laterality: N/A;  . CHOLECYSTECTOMY    . TUBAL LIGATION      Family History  Problem Relation Age of Onset  . Lung cancer Sister   . Healthy Sister        Unknown health/Unknown health  . Healthy Brother        Unknown health  . Heart disease Mother   . Unexplained death Father        Killed in World War II  . Healthy Son   . Healthy Son   . Bladder Cancer Son   . Breast cancer Sister   . Healthy Sister        Unknown health  . Bladder Cancer Maternal Aunt   . Colon cancer Neg Hx   . Esophageal cancer Neg Hx     Social History   Tobacco Use  . Smoking status: Never Smoker  . Smokeless tobacco: Never Used  Vaping Use  . Vaping Use: Never used  Substance Use Topics  . Alcohol use: No  . Drug use: No    Prior to Admission medications   Medication Sig Start Date End Date Taking? Authorizing Provider  Alirocumab (PRALUENT) 75 MG/ML SOAJ Inject 75 mg into the skin every 14 (fourteen) days. 09/29/19   Chilton Si, MD  aspirin 81 MG tablet Take 1 tablet (81 mg total) by mouth  daily. 12/14/15   Marrian Salvage, MD  isosorbide mononitrate (IMDUR) 60 MG 24 hr tablet Take 1 tablet (60 mg total) by mouth daily. 05/03/20   Inez Catalina, MD  levothyroxine (SYNTHROID) 88 MCG tablet Take 1 tablet (88 mcg total) by mouth daily before breakfast. 05/25/20   Inez Catalina, MD  lisinopril (ZESTRIL) 10 MG tablet Take 1 tablet (10 mg total) by mouth daily. 05/25/20   Inez Catalina, MD  nebivolol (BYSTOLIC) 10 MG tablet Take 1 tablet (10 mg total) by mouth daily. KE 05/25/20   Inez Catalina, MD  nitroGLYCERIN (NITROSTAT) 0.4 MG SL tablet Place 1 tablet (0.4 mg total) under the tongue every 5 (five) minutes as needed for chest pain. 04/22/19   Burns Spain, MD    Allergies Atorvastatin and Rosuvastatin   REVIEW OF SYSTEMS  Negative except as noted here or in the  History of Present Illness.   PHYSICAL EXAMINATION  Initial Vital Signs Blood pressure (!) 152/78, pulse (!) 112, temperature (!) 101 F (38.3 C), temperature source Oral, resp. rate (!) 22, height 5' (1.524 m), weight 66.2 kg, last menstrual period 10/20/1991, SpO2 (!) 87 %.  Examination General: Well-developed, well-nourished female in no acute distress; appearance consistent with age of record HENT: normocephalic; abrasion right eyebrow Eyes: pupils equal, round and reactive to light; extraocular muscles intact Neck: supple Heart: regular rate and rhythm; tachycardia Lungs: clear to auscultation bilaterally; tachypnea Abdomen: soft; nondistended; nontender; bowel sounds present Extremities: No deformity; full range of motion; pulses normal Neurologic: Awake, alert, confused; motor function intact in all extremities and symmetric; no facial droop; hard of hearing Skin: Warm and dry Psychiatric: Normal mood and affect   RESULTS  Summary of this visit's results, reviewed and interpreted by myself:   EKG Interpretation  Date/Time:  Tuesday June 21 2020 22:31:59 EDT Ventricular Rate:  98 PR Interval:    QRS Duration: 101 QT Interval:  393 QTC Calculation: 502 R Axis:   -28 Text Interpretation: Normal sinus rhythm Borderline left axis deviation Abnormal R-wave progression, late transition ST elevation, consider inferior injury Prolonged QT interval Confirmed by Paula Libra ([id number]) on 06/21/2020 10:42:14 PM      Laboratory Studies: Results for orders placed or performed during the hospital encounter of 06/21/20 (from the past 24 hour(s))  Respiratory Panel by RT PCR (Flu A&B, Covid) - Nasopharyngeal Swab     Status: Abnormal   Collection Time: 06/21/20 10:20 PM   Specimen: Nasopharyngeal Swab  Result Value Ref Range   SARS Coronavirus 2 by RT PCR POSITIVE (A) NEGATIVE   Influenza A by PCR NEGATIVE NEGATIVE   Influenza B by PCR NEGATIVE NEGATIVE  CBC with  Differential/Platelet     Status: None   Collection Time: 06/21/20 10:30 PM  Result Value Ref Range   WBC 5.8 4.0 - 10.5 K/uL   RBC 4.32 3.87 - 5.11 MIL/uL   Hemoglobin 13.5 12.0 - 15.0 g/dL   HCT 62.0 36 - 46 %   MCV 89.1 80.0 - 100.0 fL   MCH 31.3 26.0 - 34.0 pg   MCHC 35.1 30.0 - 36.0 g/dL   RDW 35.5 97.4 - 16.3 %   Platelets 215 150 - 400 K/uL   nRBC 0.0 0.0 - 0.2 %   Neutrophils Relative % 76 %   Neutro Abs 4.5 1.7 - 7.7 K/uL   Lymphocytes Relative 17 %   Lymphs Abs 1.0 0.7 - 4.0 K/uL   Monocytes Relative 6 %  Monocytes Absolute 0.3 0.1 - 1.0 K/uL   Eosinophils Relative 0 %   Eosinophils Absolute 0.0 0.0 - 0.5 K/uL   Basophils Relative 0 %   Basophils Absolute 0.0 0.0 - 0.1 K/uL   WBC Morphology MORPHOLOGY UNREMARKABLE    RBC Morphology MORPHOLOGY UNREMARKABLE    Smear Review Normal platelet morphology    Immature Granulocytes 1 %   Abs Immature Granulocytes 0.03 0.00 - 0.07 K/uL  Basic metabolic panel     Status: Abnormal   Collection Time: 06/21/20 10:30 PM  Result Value Ref Range   Sodium 126 (L) 135 - 145 mmol/L   Potassium 3.2 (L) 3.5 - 5.1 mmol/L   Chloride 92 (L) 98 - 111 mmol/L   CO2 23 22 - 32 mmol/L   Glucose, Bld 145 (H) 70 - 99 mg/dL   BUN 9 8 - 23 mg/dL   Creatinine, Ser 2.20 0.44 - 1.00 mg/dL   Calcium 7.9 (L) 8.9 - 10.3 mg/dL   GFR, Estimated >25 >42 mL/min   Anion gap 11 5 - 15  Lactic acid, plasma     Status: None   Collection Time: 06/21/20 10:33 PM  Result Value Ref Range   Lactic Acid, Venous 1.7 0.5 - 1.9 mmol/L   Imaging Studies: DG Chest Portable 1 View  Result Date: 06/21/2020 CLINICAL DATA:  Shortness of breath and COVID EXAM: PORTABLE CHEST 1 VIEW COMPARISON:  None. FINDINGS: The heart size and mediastinal contours are within normal limits. Hazy airspace opacity seen at the periphery of the left mid lung and right lower lung. No pleural effusion. Aortic knob calcifications are seen. The visualized skeletal structures are unremarkable.  IMPRESSION: Mildly hazy airspace opacity seen within the lower lungs which could be due to atelectasis and/or early infectious etiology. Electronically Signed   By: Jonna Clark M.D.   On: 06/21/2020 23:02    ED COURSE and MDM  Nursing notes, initial and subsequent vitals signs, including pulse oximetry, reviewed and interpreted by myself.  Vitals:   06/21/20 2208 06/21/20 2210 06/21/20 2212  BP:  (!) 152/78   Pulse:  (!) 112   Resp:  (!) 22   Temp:  (!) 101 F (38.3 C)   TempSrc:  Oral   SpO2:  92% (!) 87%  Weight: 66.2 kg    Height: 5' (1.524 m)     Medications  dexamethasone (DECADRON) injection 6 mg (has no administration in time range)  acetaminophen (TYLENOL) tablet 650 mg (650 mg Oral Given 06/21/20 2228)  potassium chloride SA (KLOR-CON) CR tablet 40 mEq (40 mEq Oral Given 06/21/20 2320)   11:23 PM We will start remdesivir and dexamethasone for Covid pneumonia and give potassium chloride for mild hypokalemia.  Will have patient admitted to the hospitalist service.  11:51 PM Dr. Jodelle Red of [facility] internal medicine admits for admission to their teaching service.   PROCEDURES  Procedures   ED DIAGNOSES     ICD-10-CM   1. Pneumonia due to COVID-19 virus  U07.1    J12.82   2. Hypokalemia  E87.6        Hanad Leino, Jonny Ruiz, MD 06/21/20 2352

## 2020-06-21 NOTE — Progress Notes (Signed)
Patient's SPO2 was 87% after ambulating from the waiting room to triage on room air.  Patient's resting SPO2 on room air is 90%.

## 2020-06-22 ENCOUNTER — Encounter (HOSPITAL_COMMUNITY): Payer: Self-pay | Admitting: Internal Medicine

## 2020-06-22 ENCOUNTER — Other Ambulatory Visit: Payer: Self-pay

## 2020-06-22 DIAGNOSIS — Z7982 Long term (current) use of aspirin: Secondary | ICD-10-CM | POA: Diagnosis not present

## 2020-06-22 DIAGNOSIS — J9601 Acute respiratory failure with hypoxia: Secondary | ICD-10-CM | POA: Diagnosis present

## 2020-06-22 DIAGNOSIS — Z888 Allergy status to other drugs, medicaments and biological substances status: Secondary | ICD-10-CM | POA: Diagnosis not present

## 2020-06-22 DIAGNOSIS — I110 Hypertensive heart disease with heart failure: Secondary | ICD-10-CM | POA: Diagnosis not present

## 2020-06-22 DIAGNOSIS — U071 COVID-19: Secondary | ICD-10-CM | POA: Diagnosis not present

## 2020-06-22 DIAGNOSIS — L8990 Pressure ulcer of unspecified site, unspecified stage: Secondary | ICD-10-CM | POA: Insufficient documentation

## 2020-06-22 DIAGNOSIS — E871 Hypo-osmolality and hyponatremia: Secondary | ICD-10-CM

## 2020-06-22 DIAGNOSIS — J1282 Pneumonia due to coronavirus disease 2019: Secondary | ICD-10-CM | POA: Diagnosis present

## 2020-06-22 DIAGNOSIS — I2510 Atherosclerotic heart disease of native coronary artery without angina pectoris: Secondary | ICD-10-CM | POA: Diagnosis present

## 2020-06-22 DIAGNOSIS — E039 Hypothyroidism, unspecified: Secondary | ICD-10-CM | POA: Diagnosis present

## 2020-06-22 DIAGNOSIS — E875 Hyperkalemia: Secondary | ICD-10-CM | POA: Diagnosis not present

## 2020-06-22 DIAGNOSIS — E876 Hypokalemia: Secondary | ICD-10-CM

## 2020-06-22 DIAGNOSIS — J189 Pneumonia, unspecified organism: Secondary | ICD-10-CM | POA: Diagnosis not present

## 2020-06-22 DIAGNOSIS — L89311 Pressure ulcer of right buttock, stage 1: Secondary | ICD-10-CM | POA: Diagnosis not present

## 2020-06-22 DIAGNOSIS — E785 Hyperlipidemia, unspecified: Secondary | ICD-10-CM | POA: Diagnosis present

## 2020-06-22 DIAGNOSIS — J90 Pleural effusion, not elsewhere classified: Secondary | ICD-10-CM | POA: Diagnosis not present

## 2020-06-22 DIAGNOSIS — I10 Essential (primary) hypertension: Secondary | ICD-10-CM | POA: Diagnosis not present

## 2020-06-22 DIAGNOSIS — K219 Gastro-esophageal reflux disease without esophagitis: Secondary | ICD-10-CM | POA: Diagnosis present

## 2020-06-22 DIAGNOSIS — Z9049 Acquired absence of other specified parts of digestive tract: Secondary | ICD-10-CM | POA: Diagnosis not present

## 2020-06-22 DIAGNOSIS — I5032 Chronic diastolic (congestive) heart failure: Secondary | ICD-10-CM | POA: Diagnosis present

## 2020-06-22 DIAGNOSIS — Z79899 Other long term (current) drug therapy: Secondary | ICD-10-CM | POA: Diagnosis not present

## 2020-06-22 DIAGNOSIS — Z79890 Hormone replacement therapy (postmenopausal): Secondary | ICD-10-CM | POA: Diagnosis not present

## 2020-06-22 DIAGNOSIS — E861 Hypovolemia: Secondary | ICD-10-CM | POA: Diagnosis present

## 2020-06-22 HISTORY — DX: Hypo-osmolality and hyponatremia: E87.1

## 2020-06-22 LAB — COMPREHENSIVE METABOLIC PANEL
ALT: 144 U/L — ABNORMAL HIGH (ref 0–44)
AST: 155 U/L — ABNORMAL HIGH (ref 15–41)
Albumin: 3.1 g/dL — ABNORMAL LOW (ref 3.5–5.0)
Alkaline Phosphatase: 52 U/L (ref 38–126)
Anion gap: 11 (ref 5–15)
BUN: 12 mg/dL (ref 8–23)
CO2: 24 mmol/L (ref 22–32)
Calcium: 8.7 mg/dL — ABNORMAL LOW (ref 8.9–10.3)
Chloride: 97 mmol/L — ABNORMAL LOW (ref 98–111)
Creatinine, Ser: 0.8 mg/dL (ref 0.44–1.00)
GFR, Estimated: >60 mL/min (ref >60)
Glucose, Bld: 148 mg/dL — ABNORMAL HIGH (ref 70–99)
Potassium: 3.7 mmol/L (ref 3.5–5.1)
Sodium: 132 mmol/L — ABNORMAL LOW (ref 135–145)
Total Bilirubin: 0.8 mg/dL (ref 0.3–1.2)
Total Protein: 6.4 g/dL — ABNORMAL LOW (ref 6.5–8.1)

## 2020-06-22 LAB — CBC WITH DIFFERENTIAL/PLATELET
Abs Immature Granulocytes: 0.03 10*3/uL (ref 0.00–0.07)
Basophils Absolute: 0 10*3/uL (ref 0.0–0.1)
Basophils Relative: 0 %
Eosinophils Absolute: 0 10*3/uL (ref 0.0–0.5)
Eosinophils Relative: 0 %
HCT: 40.7 % (ref 36.0–46.0)
Hemoglobin: 14 g/dL (ref 12.0–15.0)
Immature Granulocytes: 1 %
Lymphocytes Relative: 10 %
Lymphs Abs: 0.6 10*3/uL — ABNORMAL LOW (ref 0.7–4.0)
MCH: 31 pg (ref 26.0–34.0)
MCHC: 34.4 g/dL (ref 30.0–36.0)
MCV: 90.2 fL (ref 80.0–100.0)
Monocytes Absolute: 0.2 10*3/uL (ref 0.1–1.0)
Monocytes Relative: 3 %
Neutro Abs: 4.8 10*3/uL (ref 1.7–7.7)
Neutrophils Relative %: 86 %
Platelets: 266 10*3/uL (ref 150–400)
RBC: 4.51 MIL/uL (ref 3.87–5.11)
RDW: 12.4 % (ref 11.5–15.5)
WBC: 5.6 10*3/uL (ref 4.0–10.5)
nRBC: 0 % (ref 0.0–0.2)

## 2020-06-22 LAB — OSMOLALITY: Osmolality: 270 mOsm/kg — ABNORMAL LOW (ref 275–295)

## 2020-06-22 LAB — D-DIMER, QUANTITATIVE: D-Dimer, Quant: 1.13 ug/mL-FEU — ABNORMAL HIGH (ref 0.00–0.50)

## 2020-06-22 LAB — SODIUM, URINE, RANDOM: Sodium, Ur: 14 mmol/L

## 2020-06-22 LAB — PROCALCITONIN: Procalcitonin: 0.27 ng/mL

## 2020-06-22 LAB — OSMOLALITY, URINE: Osmolality, Ur: 146 mOsm/kg — ABNORMAL LOW (ref 300–900)

## 2020-06-22 LAB — C-REACTIVE PROTEIN: CRP: 6.4 mg/dL — ABNORMAL HIGH (ref <1.0)

## 2020-06-22 MED ORDER — ENOXAPARIN SODIUM 40 MG/0.4ML ~~LOC~~ SOLN
40.0000 mg | Freq: Every day | SUBCUTANEOUS | Status: DC
  Administered 2020-06-22 – 2020-06-30 (×9): 40 mg via SUBCUTANEOUS
  Filled 2020-06-22 (×9): qty 0.4

## 2020-06-22 MED ORDER — ASPIRIN 81 MG PO CHEW
81.0000 mg | CHEWABLE_TABLET | Freq: Every day | ORAL | Status: DC
  Administered 2020-06-22 – 2020-06-30 (×9): 81 mg via ORAL
  Filled 2020-06-22 (×9): qty 1

## 2020-06-22 MED ORDER — LISINOPRIL 10 MG PO TABS
10.0000 mg | ORAL_TABLET | Freq: Every day | ORAL | Status: DC
  Administered 2020-06-22 – 2020-06-28 (×7): 10 mg via ORAL
  Filled 2020-06-22 (×7): qty 1

## 2020-06-22 MED ORDER — NEBIVOLOL HCL 10 MG PO TABS
10.0000 mg | ORAL_TABLET | Freq: Every day | ORAL | Status: DC
  Administered 2020-06-22 – 2020-06-30 (×9): 10 mg via ORAL
  Filled 2020-06-22 (×10): qty 1

## 2020-06-22 MED ORDER — HYDROCOD POLST-CPM POLST ER 10-8 MG/5ML PO SUER
5.0000 mL | Freq: Two times a day (BID) | ORAL | Status: DC | PRN
  Administered 2020-06-22 – 2020-06-27 (×3): 5 mL via ORAL
  Filled 2020-06-22 (×3): qty 5

## 2020-06-22 MED ORDER — DEXAMETHASONE 6 MG PO TABS
6.0000 mg | ORAL_TABLET | ORAL | Status: DC
  Administered 2020-06-22 – 2020-06-26 (×5): 6 mg via ORAL
  Filled 2020-06-22 (×5): qty 1

## 2020-06-22 MED ORDER — LEVOTHYROXINE SODIUM 88 MCG PO TABS
88.0000 ug | ORAL_TABLET | Freq: Every day | ORAL | Status: DC
  Administered 2020-06-22 – 2020-06-30 (×9): 88 ug via ORAL
  Filled 2020-06-22 (×8): qty 1

## 2020-06-22 MED ORDER — GUAIFENESIN-DM 100-10 MG/5ML PO SYRP
10.0000 mL | ORAL_SOLUTION | ORAL | Status: DC | PRN
  Administered 2020-06-25 – 2020-06-30 (×10): 10 mL via ORAL
  Filled 2020-06-22 (×11): qty 10

## 2020-06-22 MED ORDER — ISOSORBIDE MONONITRATE ER 60 MG PO TB24
60.0000 mg | ORAL_TABLET | Freq: Every day | ORAL | Status: DC
  Administered 2020-06-22 – 2020-06-30 (×9): 60 mg via ORAL
  Filled 2020-06-22 (×10): qty 1

## 2020-06-22 MED ORDER — LEVOTHYROXINE SODIUM 88 MCG PO TABS
88.0000 ug | ORAL_TABLET | Freq: Every day | ORAL | Status: DC
  Filled 2020-06-22 (×2): qty 1

## 2020-06-22 NOTE — ED Notes (Signed)
Pt transferred to [facility] via [facility] 

## 2020-06-22 NOTE — Progress Notes (Signed)
HD#0 Subjective:   Admitted early this morning.  Evaluated at bedside during rounds. Patient reports she came to the hospital because she "Couldn't fight COVID on [her] own." Lives with son, however, states is able to take care of herself. Feels as though her breathing is much better and she has an appetite this morning. Continues to endorse weakness.  Patient has a bruise on the right side of her face, on her R temporal bone. When asked about this, she reports she lost her balance getting out of bed last week. Reports she has had ongoing balance issues over the past few months. Denies hitting her head or loss of consciousness.   Patient is unvaccinated and when asked why, she states, "I just don't want the vaccine." She hypothesizes she contracted COVID after taking an Benedetto Goad to a doctor's appointment. She states she wears a mask when out and about.    Objective:   Vital signs in last 24 hours: Vitals:   06/22/20 0200 06/22/20 0450 06/22/20 0500 06/22/20 0738  BP: (!) 128/58 (!) 142/98 135/72 120/70  Pulse: 71 77 76 73  Resp: (!) 24 20 20  (!) 28  Temp:  98.8 F (37.1 C)  97.7 F (36.5 C)  TempSrc:  Oral  Oral  SpO2: 93% 98% 98% 100%  Weight:   67 kg   Height:   5' (1.524 m)    Supplemental O2: Nasal Cannula 1.5 L Physical Exam Constitutional: tired-appearing older woman sitting upright in bed, in no acute distress HENT: normocephalic atraumatic, mucous membranes moist Pulmonary/Chest: normal work of breathing on 1.5 L [place], lungs with bibasilar crackles  Pertinent Labs: CBC Latest Ref Rng & Units 06/21/2020 06/10/2020 06/09/2018  WBC 4.0 - 10.5 K/uL 5.8 7.8 7.1  Hemoglobin 12.0 - 15.0 g/dL 06/11/2018 76.7 34.1  Hematocrit 36 - 46 % 38.5 43.1 42.4  Platelets 150 - 400 K/uL 215 330 355    CMP Latest Ref Rng & Units 06/21/2020 06/10/2020 08/13/2019  Glucose 70 - 99 mg/dL 08/15/2019) 902(I) 99  BUN 8 - 23 mg/dL 9 9 8   Creatinine 0.44 - 1.00 mg/dL 097(D 5.32  Sodium 135 - 145  mmol/L 126(L) 143 143  Potassium 3.5 - 5.1 mmol/L 3.2(L) 4.6 5.0  Chloride 98 - 111 mmol/L 92(L) 106 107(H)  CO2 22 - 32 mmol/L 23 23 22   Calcium 8.9 - 10.3 mg/dL 7.9(L) 9.4 9.9  Total Protein 6.0 - 8.5 g/dL - 7.0 7.0  Total Bilirubin 0.0 - 1.2 mg/dL - 0.2 0.4  Alkaline Phos 44 - 121 IU/L - 71 72  AST 0 - 40 IU/L - 21 21  ALT 0 - 32 IU/L - 16 18    Imaging: DG Chest Portable 1 View  Result Date: 06/21/2020 CLINICAL DATA:  Shortness of breath and COVID EXAM: PORTABLE CHEST 1 VIEW COMPARISON:  None. FINDINGS: The heart size and mediastinal contours are within normal limits. Hazy airspace opacity seen at the periphery of the left mid lung and right lower lung. No pleural effusion. Aortic knob calcifications are seen. The visualized skeletal structures are unremarkable. IMPRESSION: Mildly hazy airspace opacity seen within the lower lungs which could be due to atelectasis and/or early infectious etiology. Electronically Signed   By: 4.26 M.D.   On: 06/21/2020 23:02    Assessment/Plan:   Principal Problem:   Pneumonia due to COVID-19 virus Active Problems:   Hypothyroidism   Essential hypertension   Hyponatremia   Pressure injury of skin  Patient Summary:  KYONNA FRIER is a 79 year old unvaccinated woman with history of CAD s/p DES to LAD in 2017, HFpEF, HTN, HLD, hypothyroidism, GERD presenting for COVID-19 pneumonia.   Acute hypoxic respiratory failure secondary to COVID-19 pneumonia Today is day 7 of symptoms. Requiring 1.5 L [place] to maintain saturations >90%. Inflammatory markers pending.  - on day 1/5 of remdesivir (Day 1: 06/22/20) - on day 1/10 of dexamethasone (Day 1: 06/22/20) - currently on 1.5 L [place]; wean as able - ambulate with pulse ox - IS, flutter valve  - antitussives - self prone when able  - Trend inflammatory markers daily - PT/OT eval and treat  Hypovolemic hyponatremia Urine studies consistent with hypovolemic hyponatremia. Patient with  improved PO intake today. Repeat CMP pending. Will consider starting fluids depending on Na level.  - CMP pending  CAD HFpEF HLD - continue home aspirin, Imdur, nebivolol - resume Praluent at discharge  HTN - Continue home Imdur 60mg , lisinopril 10mg , nebivolol 10mg    Diet: Heart Healthy IVF: none, pending CMP VTE: Enoxaparin Code: Full PT/OT recs: Pending   Please contact the on call pager after 5 pm and on weekends at [phone].  , MD PGY-1 Internal Medicine Teaching Service Pager: [phone] 06/22/2020

## 2020-06-22 NOTE — Evaluation (Signed)
Physical Therapy Evaluation Patient Details Name: Vickie [name] MRN: [id number] DOB: 09/23/1940 Today's Date: 06/22/2020   History of Present Illness  Pt is 79 yo female with hx of CAD, HF, HTN, HLD, hypothyroidism, and GERD.  Pt admitted with [facility], resp failure, secondary to COVID 19 PNE.  Clinical Impression  Pt admitted with above diagnosis. Pt presenting with mild deficits in mobility, strength, endurance, and balance.  She is normally very independent.  Pt lives in a suite in her son's home.   Pt currently with functional limitations due to the deficits listed below (see PT Problem List). Pt will benefit from skilled PT to increase their independence and safety with mobility to allow discharge to the venue listed below.    PT order to ambulate on O2 and on RA: Pt on 2 LPM O2 at arrival with sats 100%.  Ambulated on 2 LPM with sats 96%.  Ambulated on RA with sats 94%.  Sats did decrease to 88% with episode of coughing when on RA,. but recovered in < 20 sec.  Placed back on 2 LPM post therapy.     Follow Up Recommendations No PT follow up (discussed could pursue outpt PT if felt she was not improving)    Equipment Recommendations  None recommended by PT    Recommendations for Other Services       Precautions / Restrictions Precautions Precautions: Fall      Mobility  Bed Mobility Overal bed mobility: Needs Assistance Bed Mobility: Supine to Sit;Sit to Supine     Supine to sit: Min guard;HOB elevated Sit to supine: Min guard;HOB elevated        Transfers Overall transfer level: Needs assistance Equipment used: None Transfers: Sit to/from Stand Sit to Stand: Min guard         General transfer comment: min guard for safety  Ambulation/Gait Ambulation/Gait assistance: Min guard Gait Distance (Feet): 250 Feet Assistive device: None Gait Pattern/deviations: Step-through pattern;Decreased stride length Gait velocity: decreased   General Gait Details: mild  unsteadiness but no overt LOB; reports fatigued easily  Careers information officer    Modified Rankin (Stroke Patients Only)       Balance Overall balance assessment: Needs assistance Sitting-balance support: No upper extremity supported Sitting balance-Leahy Scale: Good     Standing balance support: No upper extremity supported Standing balance-Leahy Scale: Fair Standing balance comment: Pt wtih slight posterior lean when donning mask in standing but did not lose balance.  Provided min guard for safety                             Pertinent Vitals/Pain Pain Assessment: No/denies pain    Home Living Family/patient expects to be discharged to:: Private residence Living Arrangements: Children Available Help at Discharge: Available PRN/intermittently;Family Type of Home: House (Lives in "suite" above son's garage - access from inside house) Home Access: Level entry     Home Layout: Multi-level Home Equipment: Shower seat      Prior Function Level of Independence: Independent         Comments: Reports very independent - works in garden (currently working on both of her son's yard) , ambulates in community, drives a little     Higher education careers adviser        Extremity/Trunk Assessment   Upper Extremity Assessment Upper Extremity Assessment: Overall WFL for tasks assessed (ROM WFL; MMT 4+/5)  Lower Extremity Assessment Lower Extremity Assessment: Overall WFL for tasks assessed (ROM WFL; MMT 4+/5)    Cervical / Trunk Assessment Cervical / Trunk Assessment: Normal  Communication   Communication: HOH  Cognition Arousal/Alertness: Awake/alert Behavior During Therapy: WFL for tasks assessed/performed Overall Cognitive Status: Within Functional Limits for tasks assessed                                        General Comments General comments (skin integrity, edema, etc.): Pt on 2 LPM O2 at arrival with sats 100%.  Ambulated  on 2 LPM with sats 96%.  Ambulated on RA with sats 94%.  Sats did decrease to 88% with episode of coughing when on RA,. but recovered in < 20 sec.  Placed back on 2 LPM post therapy.    Exercises     Assessment/Plan    PT Assessment Patient needs continued PT services  PT Problem List Decreased mobility;Decreased strength;Decreased activity tolerance;Cardiopulmonary status limiting activity;Decreased balance;Decreased knowledge of use of DME       PT Treatment Interventions DME instruction;Therapeutic activities;Gait training;Therapeutic exercise;Patient/family education;Stair training;Balance training;Functional mobility training    PT Goals (Current goals can be found in the Care Plan section)  Acute Rehab PT Goals Patient Stated Goal: return home, return to gardening PT Goal Formulation: With patient Time For Goal Achievement: 07/06/20 Potential to Achieve Goals: Good    Frequency Min 3X/week   Barriers to discharge        Co-evaluation               AM-PAC PT "6 Clicks" Mobility  Outcome Measure Help needed turning from your back to your side while in a flat bed without using bedrails?: A Little Help needed moving from lying on your back to sitting on the side of a flat bed without using bedrails?: A Little Help needed moving to and from a bed to a chair (including a wheelchair)?: A Little Help needed standing up from a chair using your arms (e.g., wheelchair or bedside chair)?: A Little Help needed to walk in hospital room?: A Little Help needed climbing 3-5 steps with a railing? : A Little 6 Click Score: 18    End of Session Equipment Utilized During Treatment: Oxygen;Gait belt Activity Tolerance: Patient tolerated treatment well Patient left: in bed;with call bell/phone within reach;with bed alarm set Nurse Communication: Mobility status PT Visit Diagnosis: Other abnormalities of gait and mobility (R26.89);Muscle weakness (generalized) (M62.81)    Time:  1245-8099 PT Time Calculation (min) (ACUTE ONLY): 30 min   Charges:   PT Evaluation $PT Eval Moderate Complexity: 1 Mod PT Treatments $Gait Training: 8-22 mins        Anise Salvo, PT Acute Rehab Services Pager [phone] [facility] [phone]    Rayetta Humphrey 06/22/2020, 5:08 PM

## 2020-06-22 NOTE — ED Notes (Signed)
Pt's Son Chales Abrahams [phone]

## 2020-06-23 DIAGNOSIS — I10 Essential (primary) hypertension: Secondary | ICD-10-CM

## 2020-06-23 LAB — COMPREHENSIVE METABOLIC PANEL
ALT: 119 U/L — ABNORMAL HIGH (ref 0–44)
AST: 109 U/L — ABNORMAL HIGH (ref 15–41)
Albumin: 2.9 g/dL — ABNORMAL LOW (ref 3.5–5.0)
Alkaline Phosphatase: 55 U/L (ref 38–126)
Anion gap: 11 (ref 5–15)
BUN: 15 mg/dL (ref 8–23)
CO2: 24 mmol/L (ref 22–32)
Calcium: 8.5 mg/dL — ABNORMAL LOW (ref 8.9–10.3)
Chloride: 97 mmol/L — ABNORMAL LOW (ref 98–111)
Creatinine, Ser: 0.84 mg/dL (ref 0.44–1.00)
GFR, Estimated: >60 mL/min (ref >60)
Glucose, Bld: 127 mg/dL — ABNORMAL HIGH (ref 70–99)
Potassium: 4.5 mmol/L (ref 3.5–5.1)
Sodium: 132 mmol/L — ABNORMAL LOW (ref 135–145)
Total Bilirubin: 0.6 mg/dL (ref 0.3–1.2)
Total Protein: 6.2 g/dL — ABNORMAL LOW (ref 6.5–8.1)

## 2020-06-23 LAB — CBC WITH DIFFERENTIAL/PLATELET
Abs Immature Granulocytes: 0.06 10*3/uL (ref 0.00–0.07)
Basophils Absolute: 0 10*3/uL (ref 0.0–0.1)
Basophils Relative: 0 %
Eosinophils Absolute: 0 10*3/uL (ref 0.0–0.5)
Eosinophils Relative: 0 %
HCT: 39.6 % (ref 36.0–46.0)
Hemoglobin: 13.3 g/dL (ref 12.0–15.0)
Immature Granulocytes: 1 %
Lymphocytes Relative: 10 %
Lymphs Abs: 0.9 10*3/uL (ref 0.7–4.0)
MCH: 30.8 pg (ref 26.0–34.0)
MCHC: 33.6 g/dL (ref 30.0–36.0)
MCV: 91.7 fL (ref 80.0–100.0)
Monocytes Absolute: 0.6 10*3/uL (ref 0.1–1.0)
Monocytes Relative: 6 %
Neutro Abs: 7.5 10*3/uL (ref 1.7–7.7)
Neutrophils Relative %: 83 %
Platelets: 283 10*3/uL (ref 150–400)
RBC: 4.32 MIL/uL (ref 3.87–5.11)
RDW: 12.6 % (ref 11.5–15.5)
WBC: 9 10*3/uL (ref 4.0–10.5)
nRBC: 0 % (ref 0.0–0.2)

## 2020-06-23 LAB — C-REACTIVE PROTEIN: CRP: 4.6 mg/dL — ABNORMAL HIGH (ref <1.0)

## 2020-06-23 LAB — D-DIMER, QUANTITATIVE: D-Dimer, Quant: 1 ug/mL-FEU — ABNORMAL HIGH (ref 0.00–0.50)

## 2020-06-23 MED ORDER — DEXAMETHASONE 6 MG PO TABS: 6.0000 mg | ORAL_TABLET | ORAL | 0 refills | Status: DC

## 2020-06-23 NOTE — Discharge Instructions (Signed)
Vickie [name],   It was a pleasure taking care of you. You were admitted to the hospital and treated for COVID-19 pneumonia. You were treated with remdesivir and steroids.  We would like you to take an additional 3 days of steroids (dexamethasone) starting tomorrow, 06/24/20. You also qualified for 3 L home oxygen.   It is important for you to continue to quarantine until 21 days after your symptoms started, so until 07/06/20.  We would like for you to follow-up with your primary care doctor in the next week. You should be able to schedule a telehealth appointment and then schedule an in-person appointment once your quarantine has ended.  Take care!  COVID-19 Vaccine: We highly recommend getting vaccinated against COVID-19. You will be eligible to receive your vaccine after your quarantine has ended. - The vaccines are SAFE.They went through all the required stages of clinical trials, and extensive testing and monitoring have shown that these vaccines are safe and effective. - The vaccines are EFFECTIVE. They lower your chances of getting and spreading the virus. They are proven to reduce your chance of severe illness, hospitalization, and death from COVID-19. - Getting vaccinated protects you, your loved ones, and people at risk for severe illness from COVID-19. - You can get vaccinated at any major pharmacy. If you are interested in setting up an appointment to get vaccinated through [facility], you can call the [facility] COVID-19 hotline at [phone].    COVID-19 COVID-19 is a respiratory infection that is caused by a virus called severe acute respiratory syndrome coronavirus 2 (SARS-CoV-2). The disease is also known as coronavirus disease or novel coronavirus. In some people, the virus may not cause any symptoms. In others, it may cause a serious infection. The infection can get worse quickly and can lead to complications, such as:  Pneumonia, or infection of the lungs.  Acute  respiratory distress syndrome or ARDS. This is a condition in which fluid build-up in the lungs prevents the lungs from filling with air and passing oxygen into the blood.  Acute respiratory failure. This is a condition in which there is not enough oxygen passing from the lungs to the body or when carbon dioxide is not passing from the lungs out of the body.  Sepsis or septic shock. This is a serious bodily reaction to an infection.  Blood clotting problems.  Secondary infections due to bacteria or fungus.  Organ failure. This is when your body's organs stop working. The virus that causes COVID-19 is contagious. This means that it can spread from person to person through droplets from coughs and sneezes (respiratory secretions). What are the causes? This illness is caused by a virus. You may catch the virus by:  Breathing in droplets from an infected person. Droplets can be spread by a person breathing, speaking, singing, coughing, or sneezing.  Touching something, like a table or a doorknob, that was exposed to the virus (contaminated) and then touching your mouth, nose, or eyes. What increases the risk? Risk for infection You are more likely to be infected with this virus if you:  Are within 6 feet (2 meters) of a person with COVID-19.  Provide care for or live with a person who is infected with COVID-19.  Spend time in crowded indoor spaces or live in shared housing. Risk for serious illness You are more likely to become seriously ill from the virus if you:  Are 11 years of age or older. The higher your age, the  more you are at risk for serious illness.  Live in a nursing home or long-term care facility.  Have cancer.  Have a long-term (chronic) disease such as: ? Chronic lung disease, including chronic obstructive pulmonary disease or asthma. ? A long-term disease that lowers your body's ability to fight infection (immunocompromised). ? Heart disease, including heart  failure, a condition in which the arteries that lead to the heart become narrow or blocked (coronary artery disease), a disease which makes the heart muscle thick, weak, or stiff (cardiomyopathy). ? Diabetes. ? Chronic kidney disease. ? Sickle cell disease, a condition in which red blood cells have an abnormal "sickle" shape. ? Liver disease.  Are obese. What are the signs or symptoms? Symptoms of this condition can range from mild to severe. Symptoms may appear any time from 2 to 14 days after being exposed to the virus. They include:  A fever or chills.  A cough.  Difficulty breathing.  Headaches, body aches, or muscle aches.  Runny or stuffy (congested) nose.  A sore throat.  New loss of taste or smell. Some people may also have stomach problems, such as nausea, vomiting, or diarrhea. Other people may not have any symptoms of COVID-19. How is this diagnosed? This condition may be diagnosed based on:  Your signs and symptoms, especially if: ? You live in an area with a COVID-19 outbreak. ? You recently traveled to or from an area where the virus is common. ? You provide care for or live with a person who was diagnosed with COVID-19. ? You were exposed to a person who was diagnosed with COVID-19.  A physical exam.  Lab tests, which may include: ? Taking a sample of fluid from the back of your nose and throat (nasopharyngeal fluid), your nose, or your throat using a swab. ? A sample of mucus from your lungs (sputum). ? Blood tests.  Imaging tests, which may include, X-rays, CT scan, or ultrasound. How is this treated? At present, there is no medicine to treat COVID-19. Medicines that treat other diseases are being used on a trial basis to see if they are effective against COVID-19. Your health care provider will talk with you about ways to treat your symptoms. For most people, the infection is mild and can be managed at home with rest, fluids, and over-the-counter  medicines. Treatment for a serious infection usually takes places in a hospital intensive care unit (ICU). It may include one or more of the following treatments. These treatments are given until your symptoms improve.  Receiving fluids and medicines through an IV.  Supplemental oxygen. Extra oxygen is given through a tube in the nose, a face mask, or a hood.  Positioning you to lie on your stomach (prone position). This makes it easier for oxygen to get into the lungs.  Continuous positive airway pressure (CPAP) or bi-level positive airway pressure (BPAP) machine. This treatment uses mild air pressure to keep the airways open. A tube that is connected to a motor delivers oxygen to the body.  Ventilator. This treatment moves air into and out of the lungs by using a tube that is placed in your windpipe.  Tracheostomy. This is a procedure to create a hole in the neck so that a breathing tube can be inserted.  Extracorporeal membrane oxygenation (ECMO). This procedure gives the lungs a chance to recover by taking over the functions of the heart and lungs. It supplies oxygen to the body and removes carbon dioxide. Follow these instructions  at home: Lifestyle  If you are sick, stay home except to get medical care. Your health care provider will tell you how long to stay home. Call your health care provider before you go for medical care.  Rest at home as told by your health care provider.  Do not use any products that contain nicotine or tobacco, such as cigarettes, e-cigarettes, and chewing tobacco. If you need help quitting, ask your health care provider.  Return to your normal activities as told by your health care provider. Ask your health care provider what activities are safe for you. General instructions  Take over-the-counter and prescription medicines only as told by your health care provider.  Drink enough fluid to keep your urine pale yellow.  Keep all follow-up visits as told  by your health care provider. This is important. How is this prevented?  There is no vaccine to help prevent COVID-19 infection. However, there are steps you can take to protect yourself and others from this virus. To protect yourself:   Do not travel to areas where COVID-19 is a risk. The areas where COVID-19 is reported change often. To identify high-risk areas and travel restrictions, check the CDC travel website: StageSync.siwwwnc.cdc.gov/travel/notices  If you live in, or must travel to, an area where COVID-19 is a risk, take precautions to avoid infection. ? Stay away from people who are sick. ? Wash your hands often with soap and water for 20 seconds. If soap and water are not available, use an alcohol-based hand sanitizer. ? Avoid touching your mouth, face, eyes, or nose. ? Avoid going out in public, follow guidance from your state and local health authorities. ? If you must go out in public, wear a cloth face covering or face mask. Make sure your mask covers your nose and mouth. ? Avoid crowded indoor spaces. Stay at least 6 feet (2 meters) away from others. ? Disinfect objects and surfaces that are frequently touched every day. This may include:  Counters and tables.  Doorknobs and light switches.  Sinks and faucets.  Electronics, such as phones, remote controls, keyboards, computers, and tablets. To protect others: If you have symptoms of COVID-19, take steps to prevent the virus from spreading to others.  If you think you have a COVID-19 infection, contact your health care provider right away. Tell your health care team that you think you may have a COVID-19 infection.  Stay home. Leave your house only to seek medical care. Do not use public transport.  Do not travel while you are sick.  Wash your hands often with soap and water for 20 seconds. If soap and water are not available, use alcohol-based hand sanitizer.  Stay away from other members of your household. Let healthy  household members care for children and pets, if possible. If you have to care for children or pets, wash your hands often and wear a mask. If possible, stay in your own room, separate from others. Use a different bathroom.  Make sure that all people in your household wash their hands well and often.  Cough or sneeze into a tissue or your sleeve or elbow. Do not cough or sneeze into your hand or into the air.  Wear a cloth face covering or face mask. Make sure your mask covers your nose and mouth. Where to find more information  Centers for Disease Control and Prevention: StickerEmporium.tnwww.cdc.gov/coronavirus/2019-ncov/index.html  World Health Organization: https://thompson-craig.com/www.who.int/health-topics/coronavirus Contact a health care provider if:  You live in or have traveled to  an area where COVID-19 is a risk and you have symptoms of the infection.  You have had contact with someone who has COVID-19 and you have symptoms of the infection. Get help right away if:  You have trouble breathing.  You have pain or pressure in your chest.  You have confusion.  You have bluish lips and fingernails.  You have difficulty waking from sleep.  You have symptoms that get worse. These symptoms may represent a serious problem that is an emergency. Do not wait to see if the symptoms will go away. Get medical help right away. Call your local emergency services (911 in the [place] Do not drive yourself to the hospital. Let the emergency medical personnel know if you think you have COVID-19. Summary  COVID-19 is a respiratory infection that is caused by a virus. It is also known as coronavirus disease or novel coronavirus. It can cause serious infections, such as pneumonia, acute respiratory distress syndrome, acute respiratory failure, or sepsis.  The virus that causes COVID-19 is contagious. This means that it can spread from person to person through droplets from breathing, speaking, singing, coughing, or sneezing.  You are  more likely to develop a serious illness if you are 45 years of age or older, have a weak immune system, live in a nursing home, or have chronic disease.  There is no medicine to treat COVID-19. Your health care provider will talk with you about ways to treat your symptoms.  Take steps to protect yourself and others from infection. Wash your hands often and disinfect objects and surfaces that are frequently touched every day. Stay away from people who are sick and wear a mask if you are sick. This information is not intended to replace advice given to you by your health care provider. Make sure you discuss any questions you have with your health care provider. Document Revised: 06/12/2019 Document Reviewed: 09/18/2018 Elsevier Patient Education  2020 [employer].

## 2020-06-23 NOTE — Progress Notes (Signed)
SATURATION QUALIFICATIONS: (This note is used to comply with regulatory documentation for home oxygen)  Patient Saturations on Room Air at Rest = 93%  Patient Saturations on Room Air while Ambulating = 85%  Patient Saturations on 3 Liters of oxygen while Ambulating = 92%  Please briefly explain why patient needs home oxygen:  Pt couldn't maintain oxygen saturations >88% on room air. Need to be increased to 3L [place] to maintain oxygen saturations >88 %.  

## 2020-06-23 NOTE — Progress Notes (Signed)
HD#1 Subjective:   No acute events overnight. Yesterday, ambulated with PT and ambulated on 2 LPM with sats 96%, ambulated on RA with sats 94%.  Evaluated at bedside during rounds. Patient states she has more energy compared to yesterday. Has been eating and drinking better, not back to baseline. Satting >90% on 2L [place] during our conversation. When discussed that we would have her ambulate with pulse ox to assess for home oxygen needs, she expresses that she would feel comfortable discharging home. Does not want ever want to be vaccinated against COVID.   Objective:   Vital signs in last 24 hours: Vitals:   06/23/20 0000 06/23/20 0400 06/23/20 0729 06/23/20 0958  BP: (!) 111/54 (!) 121/57 139/71 (!) 142/61  Pulse: 68 63 79   Resp: (!) 23 (!) 23 (!) 24   Temp: 98.2 F (36.8 C) 98.2 F (36.8 C) 98.5 F (36.9 C)   TempSrc: Oral Oral Oral   SpO2: 92% 93% 90%   Weight:      Height:       Supplemental O2: Nasal Cannula 2 L Physical Exam Constitutional: tired-appearing older woman sitting upright in bed, in no acute distress HENT: normocephalic atraumatic, mucous membranes moist Pulmonary/Chest: normal work of breathing on 2 L [place], lungs with bibasilar crackles  Pertinent Labs: CBC Latest Ref Rng & Units 06/23/2020 06/22/2020 06/21/2020  WBC 4.0 - 10.5 K/uL 9.0 5.6 5.8  Hemoglobin 12.0 - 15.0 g/dL 63.0 16.0 10.9  Hematocrit 36 - 46 % 39.6 40.7 38.5  Platelets 150 - 400 K/uL 283 266 215    CMP Latest Ref Rng & Units 06/23/2020 06/22/2020 06/21/2020  Glucose 70 - 99 mg/dL 323(F) 573(U) 202(R)  BUN 8 - 23 mg/dL 15 12 9   Creatinine 0.44 - 1.00 mg/dL 4.27 0.62  Sodium 135 - 145 mmol/L 132(L) 132(L) 126(L)  Potassium 3.5 - 5.1 mmol/L 4.5 3.7 3.2(L)  Chloride 98 - 111 mmol/L 97(L) 97(L) 92(L)  CO2 22 - 32 mmol/L 24 24 23   Calcium 8.9 - 10.3 mg/dL 3.76) ) 7.9(L)  Total Protein 6.5 - 8.1 g/dL 6.2(L) 6.4(L) -  Total Bilirubin 0.3 - 1.2 mg/dL 0.6 0.8 -  Alkaline Phos 38  - 126 U/L 55 52 -  AST 15 - 41 U/L 109(H) 155(H) -  ALT 0 - 44 U/L 119(H) 144(H) -     Assessment/Plan:   Principal Problem:   Pneumonia due to COVID-19 virus Active Problems:   Hypothyroidism   Essential hypertension   Hyponatremia   Pressure injury of skin   Hypokalemia   Patient Summary:  Vickie [name] is a 79 year old unvaccinated woman with history of CAD s/p DES to LAD in 2017, HFpEF, HTN, HLD, hypothyroidism, GERD presenting for COVID-19 pneumonia.   This is hospital day 1. Possible discharge home today pending ambulation with pulse ox for assessment of home oxygen requirement.  Acute hypoxic respiratory failure secondary to COVID-19 pneumonia Today is day 8 of symptoms. Requiring 2 L [place] to maintain saturations >90%. During ambulation with PT yesterday, patient satting 94% on room air. Inflammatory markers elevated on admission, downtrending today. Plan to ambulate with pulse ox to assess for home oxygen requirement.  - on day 2 of remdesivir (Day 1: 06/22/20) - on day 2 of dexamethasone (Day 1: 06/22/20) - currently on 2 L [place]; wean as able - ambulate with pulse ox - IS, flutter valve  - antitussives - self prone when able  - Trend inflammatory markers daily  Hypovolemic hyponatremia, resolving Sodium improved to 132 as patient's PO intake has improved.  CAD HFpEF HLD - continue home aspirin, Imdur, nebivolol - resume Praluent at discharge  HTN - Continue home Imdur 60mg , lisinopril 10mg , nebivolol 10mg    Diet: Heart Healthy IVF: none, pending CMP VTE: Enoxaparin Code: Full PT/OT recs: Pending   Please contact the on call pager after 5 pm and on weekends at [phone].  , MD PGY-1 Internal Medicine Teaching Service Pager: [phone] 06/23/2020

## 2020-06-23 NOTE — Plan of Care (Signed)
  Problem: Respiratory: Goal: Will maintain a patent airway Outcome: Progressing Goal: Complications related to the disease process, condition or treatment will be avoided or minimized Outcome: Progressing   

## 2020-06-24 ENCOUNTER — Inpatient Hospital Stay (HOSPITAL_COMMUNITY): Payer: Medicare Other

## 2020-06-24 LAB — CBC WITH DIFFERENTIAL/PLATELET
Abs Immature Granulocytes: 0.07 10*3/uL (ref 0.00–0.07)
Basophils Absolute: 0 10*3/uL (ref 0.0–0.1)
Basophils Relative: 0 %
Eosinophils Absolute: 0 10*3/uL (ref 0.0–0.5)
Eosinophils Relative: 0 %
HCT: 39.2 % (ref 36.0–46.0)
Hemoglobin: 13.3 g/dL (ref 12.0–15.0)
Immature Granulocytes: 1 %
Lymphocytes Relative: 8 %
Lymphs Abs: 0.9 10*3/uL (ref 0.7–4.0)
MCH: 30.5 pg (ref 26.0–34.0)
MCHC: 33.9 g/dL (ref 30.0–36.0)
MCV: 89.9 fL (ref 80.0–100.0)
Monocytes Absolute: 0.6 10*3/uL (ref 0.1–1.0)
Monocytes Relative: 5 %
Neutro Abs: 10.7 10*3/uL — ABNORMAL HIGH (ref 1.7–7.7)
Neutrophils Relative %: 86 %
Platelets: 371 10*3/uL (ref 150–400)
RBC: 4.36 MIL/uL (ref 3.87–5.11)
RDW: 12.4 % (ref 11.5–15.5)
WBC: 12.3 10*3/uL — ABNORMAL HIGH (ref 4.0–10.5)
nRBC: 0 % (ref 0.0–0.2)

## 2020-06-24 LAB — COMPREHENSIVE METABOLIC PANEL
ALT: 140 U/L — ABNORMAL HIGH (ref 0–44)
AST: 134 U/L — ABNORMAL HIGH (ref 15–41)
Albumin: 2.8 g/dL — ABNORMAL LOW (ref 3.5–5.0)
Alkaline Phosphatase: 65 U/L (ref 38–126)
Anion gap: 11 (ref 5–15)
BUN: 13 mg/dL (ref 8–23)
CO2: 23 mmol/L (ref 22–32)
Calcium: 8.2 mg/dL — ABNORMAL LOW (ref 8.9–10.3)
Chloride: 99 mmol/L (ref 98–111)
Creatinine, Ser: 0.62 mg/dL (ref 0.44–1.00)
GFR, Estimated: >60 mL/min (ref >60)
Glucose, Bld: 122 mg/dL — ABNORMAL HIGH (ref 70–99)
Potassium: 4 mmol/L (ref 3.5–5.1)
Sodium: 133 mmol/L — ABNORMAL LOW (ref 135–145)
Total Bilirubin: 0.7 mg/dL (ref 0.3–1.2)
Total Protein: 6 g/dL — ABNORMAL LOW (ref 6.5–8.1)

## 2020-06-24 LAB — C-REACTIVE PROTEIN: CRP: 1.5 mg/dL — ABNORMAL HIGH (ref <1.0)

## 2020-06-24 LAB — D-DIMER, QUANTITATIVE: D-Dimer, Quant: 0.89 ug/mL-FEU — ABNORMAL HIGH (ref 0.00–0.50)

## 2020-06-24 MED ORDER — IOHEXOL 350 MG/ML SOLN
100.0000 mL | Freq: Once | INTRAVENOUS | Status: AC | PRN
  Administered 2020-06-24: 100 mL via INTRAVENOUS

## 2020-06-24 MED ORDER — ACETAMINOPHEN 325 MG PO TABS
650.0000 mg | ORAL_TABLET | Freq: Four times a day (QID) | ORAL | Status: DC | PRN
  Administered 2020-06-24: 650 mg via ORAL
  Filled 2020-06-24: qty 2

## 2020-06-24 NOTE — Progress Notes (Signed)
Patient back to floor from CT.

## 2020-06-24 NOTE — Significant Event (Signed)
Paged by nursing staff about sudden increased oxygen requirement from 3L > 15L. Respiratory placed her on [place] and NRB. Saturating 96%.  Subjective: Patient state that she woke suddenly with coughing and felt that she could not catch her breath. Denies chest pain, leg pain, leg swelling, or any other new symptoms.  Objective: Blood pressure (!) 143/64, pulse 65, temperature 98.2 F (36.8 C), resp. rate (!) 24, SpO2 94 %.  D-dimer this morning of 1.00 < 1.13.  On exam is breathing comfortable on 15L, speaking in complete sentences, no confusion. Lung sounds are clear.  Assessment: Sudden oxygen requirement, but no new symptoms. Hemodynamically stable. Elevated D-dimers since admission. With the acute onset of symptoms, concerning for PE.  Plan: -f/u CTA -respiratory to wean O2 as tolerated

## 2020-06-24 NOTE — Progress Notes (Signed)
   06/24/20 0531  Vitals  Pulse Rate 65  ECG Heart Rate 67  Resp 17  MEWS COLOR  MEWS Score Color Green  Oxygen Therapy  SpO2 98 %  O2 Device Non-rebreather Mask  O2 Flow Rate (L/min) 15 L/min  Patient Activity (if Appropriate) In bed  Pulse Oximetry Type Continuous  MEWS Score  MEWS Temp 0  MEWS Systolic 0  MEWS Pulse 0  MEWS RR 0  MEWS LOC 0  MEWS Score 0    Attempted to wean patient from NRB to nasal cannula at 6L.  Patient's O2 sat dropped to 88-90%, respiration rates rose to 42 and patient complained of having difficulty "catching my breath."  Patient placed back on NRB, O2 stat improved to 95% and 20 resps/minute.

## 2020-06-24 NOTE — Care Management Important Message (Signed)
Important Message  Patient Details  Name: Vickie [name] MRN: [id number] Date of Birth: Sep 11, 1940   Medicare Important Message Given:  Yes - Important Message mailed due to current National Emergency  Verbal consent obtained due to current National Emergency  Relationship to patient: Self Contact Name: [name] Call Date: 06/24/20  Time: 1436 Phone: [phone] Outcome: Spoke with contact Important Message mailed to: Patient address on file    Orson Aloe 06/24/2020, 2:36 PM

## 2020-06-24 NOTE — Progress Notes (Signed)
HD#2 Subjective:   Overnight desaturated to mid 80s and became tachypneic. D-dimer was downtrending. CTA ordered, negative for PE. She was placed on 15L NRB.  Evaluated at bedside during rounds. Patient reports she is feeling short of breath. Reports she has been using the incentive spirometer. Discussed pulmonary rehab with patient and how it will help her recover.  Reports she has not had much of an appetite. During our conversation, she was maintaining oxygen saturations >90% on 6L [place].  Objective:   Vital signs in last 24 hours: Vitals:   06/24/20 0024 06/24/20 0028 06/24/20 0508 06/24/20 0531  BP: (!) 143/64  (!) 146/72   Pulse: 66 65 63 65  Resp: 20 (!) 24 (!) 22 17  Temp: 98.2 F (36.8 C)  97.9 F (36.6 C)   TempSrc: Oral  Axillary   SpO2: (!) 84% 94% 94% 98%  Weight:      Height:       Supplemental O2: Nasal Cannula 6 L Physical Exam Constitutional: tired-appearing older woman sitting upright in bed, in no acute distress HENT: normocephalic atraumatic, mucous membranes moist Pulmonary/Chest: normal work of breathing on 6 L , lungs with bibasilar crackles  Pertinent Labs: CBC Latest Ref Rng & Units 06/24/2020 06/23/2020 06/22/2020  WBC 4.0 - 10.5 K/uL 12.3(H) 9.0 5.6  Hemoglobin 12.0 - 15.0 g/dL 23.7 62.8 31.5  Hematocrit 36 - 46 % 39.2 39.6 40.7  Platelets 150 - 400 K/uL 371 283 266    CMP Latest Ref Rng & Units 06/24/2020 06/23/2020 06/22/2020  Glucose 70 - 99 mg/dL 176(H) 607(P) 710(G)  BUN 8 - 23 mg/dL 13 15 12   Creatinine 0.44 - 1.00 mg/dL 2.69 4.85  Sodium 135 - 145 mmol/L 133(L) 132(L) 132(L)  Potassium 3.5 - 5.1 mmol/L 4.0 4.5 3.7  Chloride 98 - 111 mmol/L 99 97(L) 97(L)  CO2 22 - 32 mmol/L 23 24 24   Calcium 8.9 - 10.3 mg/dL 8.2(L) 8.5(L) 8.7(L)  Total Protein 6.5 - 8.1 g/dL 6.0(L) 6.2(L) 6.4(L)  Total Bilirubin 0.3 - 1.2 mg/dL 0.7 0.6 0.8  Alkaline Phos 38 - 126 U/L 65 55 52  AST 15 - 41 U/L 134(H) 109(H) 155(H)  ALT 0 - 44 U/L 140(H)  119(H) 144(H)    06/24/20 CT ANGIOGRAPHY CHEST WITH CONTRAST  TECHNIQUE: Multidetector CT imaging of the chest was performed using the standard protocol during bolus administration of intravenous contrast. Multiplanar CT image reconstructions and MIPs were obtained to evaluate the vascular anatomy.  CONTRAST:  OMNIPAQUE IOHEXOL 350 MG/ML SOLN  COMPARISON:  None.  FINDINGS: Cardiovascular: Satisfactory opacification of the pulmonary arteries to the segmental level. No evidence of pulmonary embolism. Normal heart size. No pericardial effusion. Aortic and coronary atherosclerotic calcification  Mediastinum/Nodes: Negative for adenopathy or mass  Lungs/Pleura: Ground-glass opacity with subpleural predilection and intervening normal lung. No effusion or air leak seen.  Upper Abdomen: Cholecystectomy  Musculoskeletal: No acute or aggressive finding.  Spondylosis.  Review of the MIP images confirms the above findings.  IMPRESSION: 1. COVID pattern pneumonia. 2. Negative for pulmonary embolism.   Assessment/Plan:   Principal Problem:   Pneumonia due to COVID-19 virus Active Problems:   Hypothyroidism   Essential hypertension   Hyponatremia   Pressure injury of skin   Hypokalemia   Patient Summary:  Vickie [name] is a 79 year old unvaccinated woman with history of CAD s/p DES to LAD in 2017, HFpEF, HTN, HLD, hypothyroidism, GERD presenting for COVID-19 pneumonia.   This is hospital  day 2.   Acute hypoxic respiratory failure secondary to COVID-19 pneumonia Today is day 9 of symptoms. During ambulation with pulse ox yesterday, patient qualified for 3 L [place]. Overnight, patient with sudden increase in oxygen requirement to 15 L with NRB, tachypneic. CT chest PE protocol obtained negative for PE. Patient has since been weaned to 6 L [place] to maintain saturations >90%. Inflammatory markers elevated on admission, continuing to downtrend.  - on day 3 of  remdesivir (Day 1: 06/22/20) - on day 3 of dexamethasone (Day 1: 06/22/20) - currently on 6 L [place]; wean as able - IS, flutter valve  - antitussives - self prone when able  - Trend inflammatory markers daily  Hypovolemic hyponatremia, resolving Sodium improved to 133 as patient's PO intake has improved.  CAD HFpEF HLD - continue home aspirin, Imdur, nebivolol - resume Praluent at discharge  HTN - Continue home Imdur 60mg , lisinopril 10mg , nebivolol 10mg    Diet: Heart Healthy IVF: none VTE: Enoxaparin Code: Full PT/OT recs: No PT follow-up (discussed could pursue outpatient PT if felt she was not improving)   Please contact the on call pager after 5 pm and on weekends at [phone].  , MD PGY-1 Internal Medicine Teaching Service Pager: [phone] 06/24/2020

## 2020-06-24 NOTE — Progress Notes (Signed)
Physical Therapy Treatment Patient Details Name: Vickie [name] MRN: [id number] DOB: Nov 24, 1940 Today's Date: 06/24/2020    History of Present Illness Pt is 79 yo female with hx of CAD, HF, HTN, HLD, hypothyroidism, and GERD.  Pt admitted with [facility], resp failure, secondary to COVID 19 PNE.  Early on 06/24/20 pt with significant event requiring O2 increased to 15 L HFNC (from 3 LPM).  Her chest CT was negative for PE and she has been weaned back to 6 LPM O2.    PT Comments    Pt had a decline in status last night requiring increased O2 needs.  Pt cleared for PT per RN and MD.  She was able to ambulate 40' x 3 in room with cues for pursed lip breathing.  She was on 6 LPM O2 but required 8 LPM O2 for ambulation.  Pt left sitting in chair and reports feels good to be up.  She performed IS and flutter during therapy with encouragement to do frequently on her own.    Follow Up Recommendations  Home health PT     Equipment Recommendations  None recommended by PT    Recommendations for Other Services       Precautions / Restrictions Precautions Precautions: Fall Precaution Comments: watch O2    Mobility  Bed Mobility Overal bed mobility: Needs Assistance Bed Mobility: Supine to Sit     Supine to sit: Min guard;HOB elevated        Transfers Overall transfer level: Needs assistance Equipment used: None Transfers: Sit to/from Stand Sit to Stand: Min guard         General transfer comment: min guard for safety; performed x 5 during session; pt performed toielting adls with min guard  Ambulation/Gait Ambulation/Gait assistance: Min guard Gait Distance (Feet): 40 Feet (40'x3) Assistive device: None   Gait velocity: decreased   General Gait Details: Pt ambulated 40'x3 in room with 2-3 min seated rest break.  Required cues for pursed lip breathing and for rest breaks.   Stairs             Wheelchair Mobility    Modified Rankin (Stroke Patients Only)        Balance Overall balance assessment: Needs assistance Sitting-balance support: No upper extremity supported Sitting balance-Leahy Scale: Good     Standing balance support: No upper extremity supported Standing balance-Leahy Scale: Good Standing balance comment: Pt was able to ambulate without LOB                            Cognition Arousal/Alertness: Awake/alert Behavior During Therapy: WFL for tasks assessed/performed Overall Cognitive Status: Within Functional Limits for tasks assessed                                        Exercises Other Exercises Other Exercises: IS and Flutter x 3 without cues.  IS up to    General Comments General comments (skin integrity, edema, etc.): Pt was on 6 LPM O2 at arrival with sats 97%.  Tried ambulation on 6 LPM O2 with sats down to 80% with good pleth and required 2 mins to recover.  Then ambulated the last 2 bouts on 8 LPM with sats only dropping to 85% and recovery in < 30 seconds.  Sats did decrease to 77% when pt removed O2 for a few seconds to  blow her nose.  Pt was left on 6 LPM with sats 97%.      Pertinent Vitals/Pain Pain Assessment: No/denies pain    Home Living                      Prior Function            PT Goals (current goals can now be found in the care plan section) Acute Rehab PT Goals Patient Stated Goal: return home, return to gardening PT Goal Formulation: With patient Time For Goal Achievement: 07/06/20 Potential to Achieve Goals: Good Progress towards PT goals: Progressing toward goals    Frequency    Min 3X/week      PT Plan Discharge plan needs to be updated    Co-evaluation              AM-PAC PT "6 Clicks" Mobility   Outcome Measure  Help needed turning from your back to your side while in a flat bed without using bedrails?: A Little Help needed moving from lying on your back to sitting on the side of a flat bed without using bedrails?: A  Little Help needed moving to and from a bed to a chair (including a wheelchair)?: A Little Help needed standing up from a chair using your arms (e.g., wheelchair or bedside chair)?: A Little Help needed to walk in hospital room?: A Little Help needed climbing 3-5 steps with a railing? : A Little 6 Click Score: 18    End of Session Equipment Utilized During Treatment: Oxygen;Gait belt Activity Tolerance: Patient tolerated treatment well Patient left: with call bell/phone within [name];in chair;with chair alarm set Nurse Communication: Mobility status PT Visit Diagnosis: Other abnormalities of gait and mobility (R26.89);Muscle weakness (generalized) (M62.81)     Time: 9470-9628 PT Time Calculation (min) (ACUTE ONLY): 23 min  Charges:  $Gait Training: 8-22 mins $Therapeutic Activity: 8-22 mins                     Anise Salvo, PT Acute Rehab Services Pager [phone] [facility] [phone]     Vickie [name] 06/24/2020, 1:03 PM

## 2020-06-24 NOTE — Progress Notes (Signed)
   06/24/20 0024  Vitals  Temp 98.2 F (36.8 C)  Temp Source Oral  BP (!) 143/64  MAP (mmHg) 89  BP Location Right Arm  BP Method Automatic  Patient Position (if appropriate) Lying  Pulse Rate 66  ECG Heart Rate 66  Resp 20  Level of Consciousness  Level of Consciousness Alert  MEWS COLOR  MEWS Score Color Green  Oxygen Therapy  SpO2 (!) 84 %  O2 Device Nasal Cannula  O2 Flow Rate (L/min) 5 L/min  Patient Activity (if Appropriate) In bed  Pulse Oximetry Type Continuous  MEWS Score  MEWS Temp 0  MEWS Systolic 0  MEWS Pulse 0  MEWS RR 0  MEWS LOC 0  MEWS Score 0    RN notified by telemetry that O2 saturation was low 80s.  Patient having coughing episode, complained of not being able to catch breath.  Patient O2 increased from 3L to 5L via nasal cannula without any marked improvement.  Patient placed on NRB, O2 improved to 90-94%.  RT notified of situation on phone, per RT leave patient on NRB if comfortable then wean. Oncall MD has been notified via pager.

## 2020-06-25 LAB — CBC WITH DIFFERENTIAL/PLATELET
Abs Immature Granulocytes: 0.1 10*3/uL — ABNORMAL HIGH (ref 0.00–0.07)
Basophils Absolute: 0 10*3/uL (ref 0.0–0.1)
Basophils Relative: 0 %
Eosinophils Absolute: 0 10*3/uL (ref 0.0–0.5)
Eosinophils Relative: 0 %
HCT: 40.9 % (ref 36.0–46.0)
Hemoglobin: 13.7 g/dL (ref 12.0–15.0)
Immature Granulocytes: 1 %
Lymphocytes Relative: 10 %
Lymphs Abs: 1.6 10*3/uL (ref 0.7–4.0)
MCH: 31 pg (ref 26.0–34.0)
MCHC: 33.5 g/dL (ref 30.0–36.0)
MCV: 92.5 fL (ref 80.0–100.0)
Monocytes Absolute: 0.4 10*3/uL (ref 0.1–1.0)
Monocytes Relative: 3 %
Neutro Abs: 13.4 10*3/uL — ABNORMAL HIGH (ref 1.7–7.7)
Neutrophils Relative %: 86 %
Platelets: 490 10*3/uL — ABNORMAL HIGH (ref 150–400)
RBC: 4.42 MIL/uL (ref 3.87–5.11)
RDW: 12.6 % (ref 11.5–15.5)
WBC: 15.5 10*3/uL — ABNORMAL HIGH (ref 4.0–10.5)
nRBC: 0 % (ref 0.0–0.2)

## 2020-06-25 LAB — COMPREHENSIVE METABOLIC PANEL
ALT: 118 U/L — ABNORMAL HIGH (ref 0–44)
AST: 74 U/L — ABNORMAL HIGH (ref 15–41)
Albumin: 3 g/dL — ABNORMAL LOW (ref 3.5–5.0)
Alkaline Phosphatase: 74 U/L (ref 38–126)
Anion gap: 12 (ref 5–15)
BUN: 16 mg/dL (ref 8–23)
CO2: 25 mmol/L (ref 22–32)
Calcium: 8.4 mg/dL — ABNORMAL LOW (ref 8.9–10.3)
Chloride: 98 mmol/L (ref 98–111)
Creatinine, Ser: 0.76 mg/dL (ref 0.44–1.00)
GFR, Estimated: >60 mL/min (ref >60)
Glucose, Bld: 129 mg/dL — ABNORMAL HIGH (ref 70–99)
Potassium: 3.4 mmol/L — ABNORMAL LOW (ref 3.5–5.1)
Sodium: 135 mmol/L (ref 135–145)
Total Bilirubin: 0.6 mg/dL (ref 0.3–1.2)
Total Protein: 6.4 g/dL — ABNORMAL LOW (ref 6.5–8.1)

## 2020-06-25 LAB — MAGNESIUM: Magnesium: 2.4 mg/dL (ref 1.7–2.4)

## 2020-06-25 LAB — D-DIMER, QUANTITATIVE: D-Dimer, Quant: 0.74 ug/mL-FEU — ABNORMAL HIGH (ref 0.00–0.50)

## 2020-06-25 LAB — C-REACTIVE PROTEIN: CRP: 1.6 mg/dL — ABNORMAL HIGH (ref <1.0)

## 2020-06-25 MED ORDER — ADULT MULTIVITAMIN W/MINERALS CH
1.0000 | ORAL_TABLET | Freq: Every day | ORAL | Status: DC
  Administered 2020-06-25 – 2020-06-30 (×6): 1 via ORAL
  Filled 2020-06-25 (×6): qty 1

## 2020-06-25 MED ORDER — POTASSIUM CHLORIDE CRYS ER 20 MEQ PO TBCR
40.0000 meq | EXTENDED_RELEASE_TABLET | Freq: Once | ORAL | Status: AC
  Administered 2020-06-25: 40 meq via ORAL
  Filled 2020-06-25: qty 2

## 2020-06-25 MED ORDER — POLYETHYLENE GLYCOL 3350 17 G PO PACK
17.0000 g | PACK | Freq: Every day | ORAL | Status: DC
  Administered 2020-06-25 – 2020-06-27 (×3): 17 g via ORAL
  Filled 2020-06-25 (×6): qty 1

## 2020-06-25 MED ORDER — ENSURE ENLIVE PO LIQD
237.0000 mL | Freq: Three times a day (TID) | ORAL | Status: DC
  Administered 2020-06-25 – 2020-06-30 (×11): 237 mL via ORAL
  Filled 2020-06-25 (×2): qty 237

## 2020-06-25 NOTE — Progress Notes (Signed)
Pt had a coughing episode at 0200.  Her oxygen saturation dropped to 85%, placed her on 15L HFNC and during a bath at 0230 also on 15 L NRB.  Patient given PRN med for cough, pt encouraged to breathe through her nose.   Patient tends to be a mouth breather. Took patient off the NRB mask and left her at 15 HFNC.  Notified charge nurse of change in oxygen flow.  Patient has sustained in the low 90s since this increase to 15L.

## 2020-06-25 NOTE — Progress Notes (Signed)
  Date: 06/25/2020  Patient name: Vickie [name]  Medical record number: [id number]  Date of birth: 16-Sep-1940   This patient's plan of care was discussed with the house staff. Please see Dr. Aura Dials note for complete details. I concur with their findings.   Inez Catalina, MD 06/25/2020, 7:45 PM

## 2020-06-25 NOTE — Progress Notes (Signed)
Placed pt on HHFNC per MD order.

## 2020-06-25 NOTE — Progress Notes (Signed)
1000: Patient unable to maintain saturations above 90% on 15L HFNC. Exertion resulting in saturations below 80%. Dr. Geradine Girt, heated HFNC ordered  1010: son updated on patient condition  1030: Pt placed on Heated HFNC 30L 100% sating 92% no distress tolerating well

## 2020-06-25 NOTE — Progress Notes (Signed)
Initial Nutrition Assessment  RD working remotely.  DOCUMENTATION CODES:   Not applicable  INTERVENTION:   -Ensure Enlive po TID, each supplement provides 350 kcal and 20 grams of protein -MVI with minerals daily  NUTRITION DIAGNOSIS:   Increased nutrient needs related to acute illness (COVID-19 pneumonia) as evidenced by estimated needs.  GOAL:   Patient will meet greater than or equal to 90% of their needs  MONITOR:   PO intake, Supplement acceptance, Diet advancement, Labs, Weight trends, Skin, I & O's  REASON FOR ASSESSMENT:   Consult Assessment of nutrition requirement/status  ASSESSMENT:   Vickie [name] is a 78 y.o. year old female with hx of CAD s/p DES to LAD in 2017, HFpEF, HTN, HLD, hypothyroidism, GERD presenting for SOB with onset 3 days ago. Also associated body aches and generalized weakness for 1 week. Had a positive home COVID test today. Has associated diarrhea which resolved several days ago, decreased oral intake, chills. Denies rashes, LOC, nausea, vomiting.  Pt admitted with COVID-19 pneumonia.   Reviewed I/O's: -150 ml x 24 hours and -1.2 L since admission  UOP: 400 ml x 24 hours  Attempted to speak with pt via call to hospital room phone, however, unable to reach.  Per chart review, oxygenation is improved. Pt is down from 15 L of O2 to 6 L.   Noted pt with fair appetite; meal completion documented at 40%. Pt with increased nutritional needs related to COVID-19 and would greatly benefit from addition of oral nutritional supplements.  Reviewed wt hx; wt has been stable over the past year.   Medications reviewed and include decadron, miralax, and remdesivir.  Labs reviewed: K: 3.4.   Diet Order:   Diet Order            Diet Heart Room service appropriate? Yes; Fluid consistency: Thin  Diet effective now                 EDUCATION NEEDS:   No education needs have been identified at this time  Skin:  Skin Assessment: Skin  Integrity Issues: Skin Integrity Issues:: Stage I Stage I: rt buttocks  Last BM:  Unknown  Height:   Ht Readings from Last 1 Encounters:  06/22/20 5' (1.524 m)    Weight:   Wt Readings from Last 1 Encounters:  06/22/20 67 kg    Ideal Body Weight:  45.5 kg  BMI:  Body mass index is 28.85 kg/m.  Estimated Nutritional Needs:   Kcal:  2150-2350  Protein:  120-135 grams  Fluid:  > 2 L    Levada Schilling, RD, LDN, CDCES Registered Dietitian II Certified Diabetes Care and Education Specialist Please refer to [facility] for RD and/or RD on-call/weekend/after hours pager

## 2020-06-25 NOTE — Hospital Course (Addendum)
LOS: 3  79 y.o. yo female with CAD, HFpEF HTN, hypothyroidism admitted on 06/21/2020 for pna 2/2 COVID 19.  IMAGING 10/26 CXR>>bibasilar atelectasis vs infection 10/28 CTA chest>>no PE  MICRO COVID +  ABX Remdesivir 10/27>>  EVENTS 10/26 admission.  10/27: 1.5L o2  10/28: considering d/c 10/29: worsening respiratory failure requiring HFNC overnight. Improved to 6L 10/30: back up to 15L [place] sating in low 90s    ASSESSMENT/PLAN  Acute hypoxic respiratory failure secondary to COVID 19 infection -completed 5d course of decadron/remdesivir -down to 10L HHF -continue to wean solumedrol -continue baricitanib  Hyperkalemia--K 5.3   FAMILY COMMUNICATION: n/a  Barrier to D/C: oxygen requirement

## 2020-06-25 NOTE — Progress Notes (Signed)
HD#3 Subjective:   Overnight, patient had a coughing episode during which her oxygen saturation dropped to 85%, was placed on 15 L HFNC with improvement in O2 sat. Shortly thereafter was also placed on 15 L NRB, given PRN cough medicine, and encouraged to breathe through her nose. Nursing note patient tends to be a mouth breather. Later, NRB able to be removed and patient maintained O2 sats in the low 90s% on 15 L HFNC.  During rounds this morning, patient reports she feels about the same since yesterday. She continues to not have an appetite, she is trying to eat more, drinking "okay." Has not had a bowel movement since she's been here. She has not used her IS today because she doesn't feel like it. She has been sleeping off and on.   Shortly after rounds paged by nursing who informed team patient desaturating down to mid to low 80s with minimal exertion, requesting heated HFNC.   Objective:   Vital signs in last 24 hours: Vitals:   06/24/20 1754 06/24/20 1849 06/24/20 2000 06/24/20 2100  BP:   (!) 144/63   Pulse: 67 80 61   Resp: 18 (!) 22 (!) 24   Temp:   97.9 F (36.6 C)   TempSrc:   Oral   SpO2: 92% 91% 93% 90%  Weight:      Height:       Supplemental O2: HFNC 15 L Physical Exam Constitutional: tired-appearing older woman sitting upright in bed, in no acute distress HENT: normocephalic atraumatic, mucous membranes moist Pulmonary/Chest: normal work of breathing on 15 L HFNC, lungs with bibasilar crackles  Pertinent Labs: CBC Latest Ref Rng & Units 06/25/2020 06/24/2020 06/23/2020  WBC 4.0 - 10.5 K/uL 15.5(H) 12.3(H) 9.0  Hemoglobin 12.0 - 15.0 g/dL 38.2 50.5 39.7  Hematocrit 36 - 46 % 40.9 39.2 39.6  Platelets 150 - 400 K/uL 490(H) 371 283    CMP Latest Ref Rng & Units 06/25/2020 06/24/2020 06/23/2020  Glucose 70 - 99 mg/dL 673(A) 193(X) 902(I)  BUN 8 - 23 mg/dL 16 13 15   Creatinine 0.44 - 1.00 mg/dL 0.97 3.53  Sodium 135 - 145 mmol/L 135 133(L) 132(L)    Potassium 3.5 - 5.1 mmol/L 3.4(L) 4.0 4.5  Chloride 98 - 111 mmol/L 98 99 97(L)  CO2 22 - 32 mmol/L 25 23 24   Calcium 8.9 - 10.3 mg/dL 2.99) ) 2.4(Q)  Total Protein 6.5 - 8.1 g/dL 6.4(L) 6.0(L) 6.2(L)  Total Bilirubin 0.3 - 1.2 mg/dL 0.6 0.7 0.6  Alkaline Phos 38 - 126 U/L 74 65 55  AST 15 - 41 U/L 74(H) 134(H) 109(H)  ALT 0 - 44 U/L 118(H) 140(H) 119(H)   CRP: 1.6<1.5<4.6<6.4 D-dimer: 0.74<0.89<1.00<1.13  Assessment/Plan:   Principal Problem:   Pneumonia due to COVID-19 virus Active Problems:   Hypothyroidism   Essential hypertension   Hyponatremia   Pressure injury of skin   Hypokalemia   Patient Summary:  Vickie [name] is a 79 year old unvaccinated woman with history of CAD s/p DES to LAD in 2017, HFpEF, HTN, HLD, hypothyroidism, GERD presenting for COVID-19 pneumonia.   This is hospital day 3.   Acute hypoxic respiratory failure secondary to COVID-19 pneumonia Today is day 10 of symptoms. Overnight, patient transitioned to 15 L HFNC and 15 L NRB. After encouragement to breathe through her nose, NRB able to be removed. During rounds, patient maintaining saturations >90% on 15 L HFNC, however later in the morning noted to be unable to maintain  saturations above 90% on 15 L HFNC with exertion resulting in saturations <80%. Patient placed on heated HFNC. Inflammatory markers elevated on admission, continuing to downtrend. WBC 15.5 today, up from 12.3 yesterday and 5.6 on admission. Suspect leukocytosis secondary to steroid treatment.  - on day 4 of remdesivir (Day 1: 06/22/20) - on day 4 of dexamethasone (Day 1: 06/22/20) - currently on 15 L heated HFNC; wean as able - IS, flutter valve  - antitussives - self prone when able  - Trend inflammatory markers daily - Miralax   CAD HFpEF HLD - continue home aspirin, Imdur, nebivolol - resume Praluent at discharge  HTN - Continue home Imdur 60mg , lisinopril 10mg , nebivolol 10mg    Diet: Heart Healthy,  supplements & MVI per nutrition IVF: none VTE: Enoxaparin Code: Full PT/OT recs: Home health PT   Please contact the on call pager after 5 pm and on weekends at [phone].  , MD PGY-1 Internal Medicine Teaching Service Pager: [phone] 06/25/2020

## 2020-06-26 LAB — COMPREHENSIVE METABOLIC PANEL
ALT: 86 U/L — ABNORMAL HIGH (ref 0–44)
AST: 44 U/L — ABNORMAL HIGH (ref 15–41)
Albumin: 2.7 g/dL — ABNORMAL LOW (ref 3.5–5.0)
Alkaline Phosphatase: 71 U/L (ref 38–126)
Anion gap: 10 (ref 5–15)
BUN: 12 mg/dL (ref 8–23)
CO2: 22 mmol/L (ref 22–32)
Calcium: 8.5 mg/dL — ABNORMAL LOW (ref 8.9–10.3)
Chloride: 102 mmol/L (ref 98–111)
Creatinine, Ser: 0.63 mg/dL (ref 0.44–1.00)
GFR, Estimated: >60 mL/min (ref >60)
Glucose, Bld: 115 mg/dL — ABNORMAL HIGH (ref 70–99)
Potassium: 4.1 mmol/L (ref 3.5–5.1)
Sodium: 134 mmol/L — ABNORMAL LOW (ref 135–145)
Total Bilirubin: 0.7 mg/dL (ref 0.3–1.2)
Total Protein: 6 g/dL — ABNORMAL LOW (ref 6.5–8.1)

## 2020-06-26 LAB — CBC WITH DIFFERENTIAL/PLATELET
Abs Immature Granulocytes: 0.16 10*3/uL — ABNORMAL HIGH (ref 0.00–0.07)
Basophils Absolute: 0 10*3/uL (ref 0.0–0.1)
Basophils Relative: 0 %
Eosinophils Absolute: 0 10*3/uL (ref 0.0–0.5)
Eosinophils Relative: 0 %
HCT: 39.7 % (ref 36.0–46.0)
Hemoglobin: 13.4 g/dL (ref 12.0–15.0)
Immature Granulocytes: 1 %
Lymphocytes Relative: 8 %
Lymphs Abs: 1.2 10*3/uL (ref 0.7–4.0)
MCH: 30.7 pg (ref 26.0–34.0)
MCHC: 33.8 g/dL (ref 30.0–36.0)
MCV: 91.1 fL (ref 80.0–100.0)
Monocytes Absolute: 0.4 10*3/uL (ref 0.1–1.0)
Monocytes Relative: 2 %
Neutro Abs: 14.6 10*3/uL — ABNORMAL HIGH (ref 1.7–7.7)
Neutrophils Relative %: 89 %
Platelets: 538 10*3/uL — ABNORMAL HIGH (ref 150–400)
RBC: 4.36 MIL/uL (ref 3.87–5.11)
RDW: 12.3 % (ref 11.5–15.5)
WBC: 16.4 10*3/uL — ABNORMAL HIGH (ref 4.0–10.5)
nRBC: 0 % (ref 0.0–0.2)

## 2020-06-26 LAB — C-REACTIVE PROTEIN: CRP: 2.5 mg/dL — ABNORMAL HIGH (ref <1.0)

## 2020-06-26 LAB — D-DIMER, QUANTITATIVE: D-Dimer, Quant: 0.89 ug/mL-FEU — ABNORMAL HIGH (ref 0.00–0.50)

## 2020-06-26 MED ORDER — METHYLPREDNISOLONE SODIUM SUCC 125 MG IJ SOLR
60.0000 mg | Freq: Three times a day (TID) | INTRAMUSCULAR | Status: AC
  Administered 2020-06-26 – 2020-06-27 (×3): 60 mg via INTRAVENOUS
  Filled 2020-06-26 (×3): qty 2

## 2020-06-26 MED ORDER — BARICITINIB 2 MG PO TABS
4.0000 mg | ORAL_TABLET | Freq: Every day | ORAL | Status: DC
  Administered 2020-06-26 – 2020-06-30 (×5): 4 mg via ORAL
  Filled 2020-06-26 (×5): qty 2

## 2020-06-26 NOTE — Progress Notes (Addendum)
° ° °  HD#4 Subjective:   Pain endorses feeling short of breath intermittently. No other complaints  Objective:   Vital signs in last 24 hours: Vitals:   06/26/20 0339 06/26/20 0813 06/26/20 1016 06/26/20 1030  BP: (!) 127/48 (!) 166/70 (!) 166/70 (!) 138/58  Pulse: 77 100 97 97  Resp: (!) 22 16 (!) 27 (!) 23  Temp: 98.2 F (36.8 C)   98.3 F (36.8 C)  TempSrc: Axillary   Oral  SpO2: (!) 87% (!) 86% 95% 95%  Weight:      Height:       Physical Exam General: acutely ill appearing female Cardiac: tachycardic rate, regular rhythm Pulm: tachypneic on 15L NRB + 40L HHF course breath sounds posteriorly.   Pertinent Labs: CBC Latest Ref Rng & Units 06/26/2020 06/25/2020 06/24/2020  WBC 4.0 - 10.5 K/uL 16.4(H) 15.5(H) 12.3(H)  Hemoglobin 12.0 - 15.0 g/dL 09.7 35.3 29.9  Hematocrit 36 - 46 % 39.7 40.9 39.2  Platelets 150 - 400 K/uL 538(H) 490(H) 371    CMP Latest Ref Rng & Units 06/26/2020 06/25/2020 06/24/2020  Glucose 70 - 99 mg/dL 242(A) 834(H) 962(I)  BUN 8 - 23 mg/dL 12 16 13   Creatinine 0.44 - 1.00 mg/dL 2.97 9.89  Sodium 135 - 145 mmol/L 134(L) 135 133(L)  Potassium 3.5 - 5.1 mmol/L 4.1 3.4(L) 4.0  Chloride 98 - 111 mmol/L 102 98 99  CO2 22 - 32 mmol/L 22 25 23   Calcium 8.9 - 10.3 mg/dL 2.11) ) 9.4(R)  Total Protein 6.5 - 8.1 g/dL 6.0(L) 6.4(L) 6.0(L)  Total Bilirubin 0.3 - 1.2 mg/dL 0.7 0.6 0.7  Alkaline Phos 38 - 126 U/L 71 74 65  AST 15 - 41 U/L 44(H) 74(H) 134(H)  ALT 0 - 44 U/L 86(H) 118(H) 140(H)    Assessment/Plan:   Principal Problem:   Pneumonia due to COVID-19 virus Active Problems:   Hypothyroidism   Essential hypertension   Hyponatremia   Pressure injury of skin   Hypokalemia   Patient Summary:  Vickie [name] is a 79 year old unvaccinated woman with history of CAD s/p DES to LAD in 2017, HFpEF, HTN, HLD, hypothyroidism, GERD presenting for COVID-19 pneumonia.   This is hospital day 4.   Acute hypoxic respiratory failure  secondary to COVID-19 pneumonia -increasing supplemental oxygen requirement over the past 2d--now on 15L NRB + 40L HHF. Maintaining O2 sats in the low 90s with this. -increase in WBC, platelets and CRP Plan: - on day 5 of remdesivir (Day 1: 06/22/20) - on day 5 of dexamethasone (Day 1: 06/22/20) - low threshold to get PCCM on board if supplemental oxygen requirement continues to increase.  - IS, flutter valve  - antitussives - self prone when able  - Trend inflammatory markers daily - Miralax   CAD HFpEF HLD - continue home aspirin, Imdur, nebivolol - resume Praluent at discharge  HTN - Continue home Imdur 60mg , lisinopril 10mg , nebivolol 10mg    Diet: Heart Healthy, supplements & MVI per nutrition IVF: none VTE: Enoxaparin Code: Full PT/OT recs: Home health PT  ADDENDUM #1 2:40 PM : Spoke with pharmacy regarding progressive O2 requirement. Recommending increased steroid and starting baricitanib.  Plan: started on 60mg  solumedrol q8h and 4mg  baricitanib.    Please contact the on call pager after 5 pm and on weekends at [phone].  06/24/20, MD Internal Medicine Resident PGY-2 Internal Medicine Residency Pager: [phone] 06/26/2020 12:28 PM

## 2020-06-26 NOTE — Progress Notes (Addendum)
   06/26/20 2257  Provider Notification  Provider Name/Title Dr. Imogene Burn  Date Provider Notified 06/26/20  Time Provider Notified 2257  Notification Type Page  Notification Reason Requested by patient/family (Son Theodoro Doing needs update fr the attending DR. )  Response  (paged/waiting )    Received call from MD. He  is going to give an update to the patient's SON.   Felipa Laroche contact Number is [phone]

## 2020-06-27 ENCOUNTER — Inpatient Hospital Stay (HOSPITAL_COMMUNITY): Payer: Medicare Other

## 2020-06-27 DIAGNOSIS — U071 COVID-19: Principal | ICD-10-CM

## 2020-06-27 DIAGNOSIS — J1282 Pneumonia due to coronavirus disease 2019: Secondary | ICD-10-CM

## 2020-06-27 LAB — CBC
HCT: 40.4 % (ref 36.0–46.0)
Hemoglobin: 13.7 g/dL (ref 12.0–15.0)
MCH: 30.9 pg (ref 26.0–34.0)
MCHC: 33.9 g/dL (ref 30.0–36.0)
MCV: 91 fL (ref 80.0–100.0)
Platelets: 663 10*3/uL — ABNORMAL HIGH (ref 150–400)
RBC: 4.44 MIL/uL (ref 3.87–5.11)
RDW: 12.5 % (ref 11.5–15.5)
WBC: 14.7 10*3/uL — ABNORMAL HIGH (ref 4.0–10.5)
nRBC: 0 % (ref 0.0–0.2)

## 2020-06-27 LAB — PROCALCITONIN: Procalcitonin: <0.1 ng/mL

## 2020-06-27 MED ORDER — METHYLPREDNISOLONE SODIUM SUCC 125 MG IJ SOLR: 60.0000 mg | Freq: Three times a day (TID) | INTRAMUSCULAR | Status: DC

## 2020-06-27 MED ORDER — METHYLPREDNISOLONE SODIUM SUCC 125 MG IJ SOLR
60.0000 mg | Freq: Three times a day (TID) | INTRAMUSCULAR | Status: DC
  Administered 2020-06-27 – 2020-06-28 (×3): 60 mg via INTRAVENOUS
  Filled 2020-06-27 (×3): qty 2

## 2020-06-27 NOTE — Progress Notes (Signed)
Subjective:   Hospital day: 5  Overnight event: No acute event overnight  Patient is seen at bedside.  She is appears comfortable and in no acute distress.  However she becomes tachypneic and her oxygen saturation drops in the high 80s when she speaks.  Patient is back on 30 L of heated high flow.  Patient is not vaccinated against Covid.  Speaking with her son Theodoro Doing this morning.  I explained to him patient's current respiratory status.  She still has high oxygen requirement and continue high dose of steroids and baricitinib.  Her son hopes that patient will get better and does not get intubated.  Will call and update him every day.  Objective:  Vital signs in last 24 hours: Vitals:   06/26/20 2300 06/27/20 0200 06/27/20 0247 06/27/20 0437  BP:    134/61  Pulse:   75 79  Resp:   (!) 22 18  Temp:    98.2 F (36.8 C)  TempSrc:    Oral  SpO2: 95% 96% 92% 91%  Weight:      Height:       CBC Latest Ref Rng & Units 06/27/2020 06/26/2020 06/25/2020  WBC 4.0 - 10.5 K/uL 14.7(H) 16.4(H) 15.5(H)  Hemoglobin 12.0 - 15.0 g/dL 26.3 33.5 45.6  Hematocrit 36 - 46 % 40.4 39.7 40.9  Platelets 150 - 400 K/uL 663(H) 538(H) 490(H)   BMP Latest Ref Rng & Units 06/26/2020 06/25/2020 06/24/2020  Glucose 70 - 99 mg/dL 256(L) 893(T) 342(A)  BUN 8 - 23 mg/dL 12 16 13   Creatinine 0.44 - 1.00 mg/dL 7.68 1.15  BUN/Creat Ratio 12 - 28 - - -  Sodium 135 - 145 mmol/L 134(L) 135 133(L)  Potassium 3.5 - 5.1 mmol/L 4.1 3.4(L) 4.0  Chloride 98 - 111 mmol/L 102 98 99  CO2 22 - 32 mmol/L 22 25 23   Calcium 8.9 - 10.3 mg/dL 7.26) ) 2.0(B)    Physical Exam Physical Exam Constitutional:      General: She is not in acute distress.    Comments: Tachypneic when speaking  HENT:     Head: Normocephalic.  Eyes:     General:        Right eye: No discharge.        Left eye: No discharge.  Cardiovascular:     Rate and Rhythm: Normal rate and regular rhythm.     Heart sounds: Normal heart  sounds.  Pulmonary:     Effort: No respiratory distress.     Breath sounds: No wheezing.     Comments: Mild crackles noted at bilateral lung base Musculoskeletal:     Cervical back: Normal range of motion.     Right lower leg: No edema.     Left lower leg: No edema.  Skin:    General: Skin is warm.  Neurological:     Mental Status: She is alert.  Psychiatric:        Mood and Affect: Mood normal.     Assessment/Plan: CATHLEEN YAGI is a 79 y.o. female with hx of hypothyroidism, hypertension, hyperlipidemia, CAD status post DES to LAD in 2017, presenting with acute hypoxic respiratory failure secondary to COVID-19 pneumonia.  Patient is not vaccinated against Covid.  Principal Problem:   Pneumonia due to COVID-19 virus Active Problems:   Hypothyroidism   Essential hypertension   Hyponatremia   Pressure injury of skin   Hypokalemia   Acute hypoxic respiratory failure secondary to COVID-19 pneumonia Patient tested positive  for Covid on 10/27/121.  She is not vaccinated against Covid.  She finished 5-day course of remdesivir.  However patient still has high oxygen requirement with 15 L of nonrebreather +30 L of heated high flow to maintain oxygen saturation in the high 80s low 90s.  CTA on admission is negative for PE.  Procalcitonin was 0.27 on admission.  Her steroid was increased to Solu-Medrol 60 mg 3 times daily and baricitinib was added.  Curb side consult PCCM and they recommend repeat chest x-ray, check pro-Cal and maintain a negative fluid status.  PCCM will see the patient if she requires BiPAP or intubation. -Given her continuing high oxygen requirement, will continue Solu-Medrol 60 mg 3 times daily -Continue baricitinib -Continue current oxygen regimen, titrate down as tolerable -Incentive spirometry, flutter valve -Tylenol as needed for fever or pain -Continue to trend inflammatory markers -CBC and BMP daily -Low threshold to call PCCM if patient will require BiPAP  or intubation -Self prone when able   CAD HFpEF HLD -Continue home aspirin, Imdur, nebivolol -Resume Praluent at discharge   HTN -Continue home Imdur 60mg , lisinopril 10mg , nebivolol 10mg   Diet: Heart healthy diet IVF: No VTE: Lovenox CODE: Full  Prior to Admission Living Arrangement: Home Anticipated Discharge Location: Home Barriers to Discharge: High oxygen requirement Dispo: Anticipated discharge in approximately 4 day(s).   , DO 06/27/2020, 5:58 AM Pager: [phone] After 5pm on weekdays and 1pm on weekends: On Call pager [phone]

## 2020-06-27 NOTE — Plan of Care (Signed)
  Problem: Education: Goal: Knowledge of risk factors and measures for prevention of condition will improve Outcome: Progressing   Problem: Respiratory: Goal: Will maintain a patent airway Outcome: Progressing   Problem: Coping: Goal: Psychosocial and spiritual needs will be supported Outcome: Progressing   Problem: Respiratory: Goal: Complications related to the disease process, condition or treatment will be avoided or minimized Outcome: Progressing

## 2020-06-27 NOTE — Progress Notes (Signed)
Physical Therapy Treatment Patient Details Name: Vickie [name] MRN: [id number] DOB: Dec 17, 1940 Today's Date: 06/27/2020    History of Present Illness Pt is 79 yo female with hx of CAD, HF, HTN, HLD, hypothyroidism, and GERD.  Pt admitted with [facility], resp failure, secondary to COVID 19 PNE.  Early on 06/24/20 pt with significant event requiring O2 increased to 15 L HFNC (from 3 LPM).  Her chest CT was negative for PE and she was weaned back to 6 LPM.  However, she has worsened and is now on heated high oxygen.    PT Comments    Pt on increased O2 but was willing to participate.  She is now on [facility] so was limited to marching in place and standing exercises.  She performed 4 bouts of activity for ~30 sec each with 2-3 min seated rest breaks with back support to recover.  Required frequent cues for pursed lip breathing and upright posture to promote increase lung capacity.  Pt's sats dropped as low as 83%.  Continue plan of care.     Follow Up Recommendations  Home health PT;Supervision/Assistance - 24 hour     Equipment Recommendations  None recommended by PT    Recommendations for Other Services       Precautions / Restrictions Precautions Precautions: Fall Precaution Comments: watch O2    Mobility  Bed Mobility               General bed mobility comments: in chair  Transfers Overall transfer level: Needs assistance Equipment used: None Transfers: Sit to/from Stand Sit to Stand: Min guard         General transfer comment: performed x 4  Ambulation/Gait Ambulation/Gait assistance: Min guard   Assistive device: 1 person hand held assist   Gait velocity: decreased   General Gait Details: marched in place x 2 for 30 sec each with 3 min seated rest break; cues for pursed lip breathing and min guard for steadying   Stairs             Wheelchair Mobility    Modified Rankin (Stroke Patients Only)       Balance Overall balance assessment: Needs  assistance Sitting-balance support: No upper extremity supported Sitting balance-Leahy Scale: Good     Standing balance support: No upper extremity supported Standing balance-Leahy Scale: Poor Standing balance comment: UE support todya                            Cognition Arousal/Alertness: Awake/alert Behavior During Therapy: WFL for tasks assessed/performed Overall Cognitive Status: Within Functional Limits for tasks assessed                                        Exercises Other Exercises Other Exercises: 10x mini squats and 10 x heel raises with HHA: seated rest break and then repeated    General Comments General comments (skin integrity, edema, etc.): Pt was on 25 L HHFNC at 70% FiO2 wtih 15 L NRB.  Sats were 94% rest, dropped to 88% for first 2 bouts of activity, then 86% for 3rd bout (required 30 sec to recover), and 83% for forth bout (required 1.5 mins to recover).  Pt cued for pursed lip breathing. .      Pertinent Vitals/Pain Pain Assessment: No/denies pain    Home Living  Prior Function            PT Goals (current goals can now be found in the care plan section) Acute Rehab PT Goals Patient Stated Goal: return home, return to gardening PT Goal Formulation: With patient Time For Goal Achievement: 07/06/20 Potential to Achieve Goals: Good Progress towards PT goals: Progressing toward goals    Frequency    Min 3X/week      PT Plan Current plan remains appropriate    Co-evaluation              AM-PAC PT "6 Clicks" Mobility   Outcome Measure  Help needed turning from your back to your side while in a flat bed without using bedrails?: A Little Help needed moving from lying on your back to sitting on the side of a flat bed without using bedrails?: A Little Help needed moving to and from a bed to a chair (including a wheelchair)?: A Little Help needed standing up from a chair using your arms  (e.g., wheelchair or bedside chair)?: A Little Help needed to walk in hospital room?: A Little Help needed climbing 3-5 steps with a railing? : A Little 6 Click Score: 18    End of Session Equipment Utilized During Treatment: Oxygen;Gait belt Activity Tolerance: Patient tolerated treatment well Patient left: with call bell/phone within reach;in chair;with chair alarm set Nurse Communication: Mobility status PT Visit Diagnosis: Other abnormalities of gait and mobility (R26.89);Muscle weakness (generalized) (M62.81)     Time: 3299-2426 PT Time Calculation (min) (ACUTE ONLY): 15 min  Charges:  $Therapeutic Exercise: 8-22 mins                     Anise Salvo, PT Acute Rehab Services Pager [phone] [facility] [phone]     Rayetta Humphrey 06/27/2020, 4:57 PM

## 2020-06-28 LAB — CBC WITH DIFFERENTIAL/PLATELET
Abs Immature Granulocytes: 0.24 10*3/uL — ABNORMAL HIGH (ref 0.00–0.07)
Basophils Absolute: 0 10*3/uL (ref 0.0–0.1)
Basophils Relative: 0 %
Eosinophils Absolute: 0 10*3/uL (ref 0.0–0.5)
Eosinophils Relative: 0 %
HCT: 38.1 % (ref 36.0–46.0)
Hemoglobin: 13 g/dL (ref 12.0–15.0)
Immature Granulocytes: 1 %
Lymphocytes Relative: 6 %
Lymphs Abs: 1.1 10*3/uL (ref 0.7–4.0)
MCH: 31 pg (ref 26.0–34.0)
MCHC: 34.1 g/dL (ref 30.0–36.0)
MCV: 90.7 fL (ref 80.0–100.0)
Monocytes Absolute: 0.5 10*3/uL (ref 0.1–1.0)
Monocytes Relative: 3 %
Neutro Abs: 16 10*3/uL — ABNORMAL HIGH (ref 1.7–7.7)
Neutrophils Relative %: 90 %
Platelets: 777 10*3/uL — ABNORMAL HIGH (ref 150–400)
RBC: 4.2 MIL/uL (ref 3.87–5.11)
RDW: 12.4 % (ref 11.5–15.5)
WBC: 17.9 10*3/uL — ABNORMAL HIGH (ref 4.0–10.5)
nRBC: 0 % (ref 0.0–0.2)

## 2020-06-28 LAB — SEDIMENTATION RATE: Sed Rate: 19 mm/hr (ref 0–22)

## 2020-06-28 LAB — BASIC METABOLIC PANEL
Anion gap: 10 (ref 5–15)
BUN: 23 mg/dL (ref 8–23)
CO2: 23 mmol/L (ref 22–32)
Calcium: 8.6 mg/dL — ABNORMAL LOW (ref 8.9–10.3)
Chloride: 101 mmol/L (ref 98–111)
Creatinine, Ser: 0.69 mg/dL (ref 0.44–1.00)
GFR, Estimated: >60 mL/min (ref >60)
Glucose, Bld: 156 mg/dL — ABNORMAL HIGH (ref 70–99)
Potassium: 5 mmol/L (ref 3.5–5.1)
Sodium: 134 mmol/L — ABNORMAL LOW (ref 135–145)

## 2020-06-28 LAB — TROPONIN I (HIGH SENSITIVITY): Troponin I (High Sensitivity): 11 ng/L (ref <18)

## 2020-06-28 LAB — GLUCOSE, CAPILLARY
Glucose-Capillary: 126 mg/dL — ABNORMAL HIGH (ref 70–99)
Glucose-Capillary: 157 mg/dL — ABNORMAL HIGH (ref 70–99)
Glucose-Capillary: 156 mg/dL — ABNORMAL HIGH (ref 70–99)
Glucose-Capillary: 176 mg/dL — ABNORMAL HIGH (ref 70–99)

## 2020-06-28 LAB — FIBRINOGEN: Fibrinogen: 603 mg/dL — ABNORMAL HIGH (ref 210–475)

## 2020-06-28 LAB — C-REACTIVE PROTEIN: CRP: 4.4 mg/dL — ABNORMAL HIGH (ref <1.0)

## 2020-06-28 LAB — HEMOGLOBIN A1C
Hgb A1c MFr Bld: 6.4 % — ABNORMAL HIGH (ref 4.8–5.6)
Mean Plasma Glucose: 136.98 mg/dL

## 2020-06-28 LAB — D-DIMER, QUANTITATIVE: D-Dimer, Quant: 0.85 ug/mL-FEU — ABNORMAL HIGH (ref 0.00–0.50)

## 2020-06-28 MED ORDER — METHYLPREDNISOLONE SODIUM SUCC 125 MG IJ SOLR
60.0000 mg | Freq: Two times a day (BID) | INTRAMUSCULAR | Status: DC
  Administered 2020-06-28 – 2020-06-29 (×2): 60 mg via INTRAVENOUS
  Filled 2020-06-28 (×2): qty 2

## 2020-06-28 MED ORDER — INSULIN ASPART 100 UNIT/ML ~~LOC~~ SOLN
0.0000 [IU] | Freq: Three times a day (TID) | SUBCUTANEOUS | Status: DC
  Administered 2020-06-28 (×2): 3 [IU] via SUBCUTANEOUS
  Administered 2020-06-28: 2 [IU] via SUBCUTANEOUS
  Administered 2020-06-29 (×2): 3 [IU] via SUBCUTANEOUS

## 2020-06-28 MED ORDER — SALINE SPRAY 0.65 % NA SOLN
1.0000 | NASAL | Status: DC | PRN
  Administered 2020-06-28: 1 via NASAL
  Filled 2020-06-28: qty 44

## 2020-06-28 NOTE — Evaluation (Signed)
Occupational Therapy Evaluation Patient Details Name: Vickie [name] MRN: [id number] DOB: Dec 23, 1940 Today's Date: 06/28/2020    History of Present Illness Pt is 79 yo female with hx of CAD, HF, HTN, HLD, hypothyroidism, and GERD.  Pt admitted with [facility], resp failure, secondary to COVID 19 PNE.  Early on 06/24/20 pt with significant event requiring O2 increased to 15 L HFNC (from 3 LPM).  Her chest CT was negative for PE and she was weaned back to 6 LPM.  However, she has worsened and is now on heated high oxygen.   Clinical Impression   PTA, pt was living with her son and was independent. Pt currently requiring Min Guard-Min A for UB ADLs, Min A for LB ADLs, and Min Guard A for functional transfer. SpO2 maintaining in 90s on 15L HHFNC and 15L NRB. Pt presenting with poor activity tolerance impacting her safe performance of ADLs. After resting in recliner, doffed NRB and SpO2 staying >88% on 15 HHFNC only. Pt would benefit from further acute OT to facilitate safe dc. Recommend dc to home with HHOT for further OT to optimize safety, independence with ADLs, and return to PLOF.     Follow Up Recommendations  Home health OT;Supervision/Assistance - 24 hour    Equipment Recommendations  3 in 1 bedside commode (May not need it pending pt progress)    Recommendations for Other Services PT consult     Precautions / Restrictions Precautions Precautions: Fall Precaution Comments: watch O2      Mobility Bed Mobility               General bed mobility comments: In recliner upon arrival    Transfers Overall transfer level: Needs assistance Equipment used: None Transfers: Sit to/from [employer] Sit to Stand: Min guard Stand pivot transfers: Min guard       General transfer comment: Min Guard A for safety    Balance Overall balance assessment: Needs assistance Sitting-balance support: No upper extremity supported Sitting balance-Leahy Scale: Good      Standing balance support: No upper extremity supported Standing balance-Leahy Scale: Fair Standing balance comment: Static standing balance                           ADL either performed or assessed with clinical judgement   ADL Overall ADL's : Needs assistance/impaired Eating/Feeding: Set up;Sitting   Grooming: Set up;Supervision/safety;Sitting   Upper Body Bathing: Minimal assistance;Sitting   Lower Body Bathing: Minimal assistance;Sit to/from stand   Upper Body Dressing : Min guard;Sitting   Lower Body Dressing: Minimal assistance;Sit to/from stand   Toilet Transfer: Min guard;Stand-pivot;BSC   Toileting- Architect and Hygiene: Min guard;Sit to/from stand       Functional mobility during ADLs: Min guard (stand pivot only) General ADL Comments: Pt presenting with poor activity tolerance impacting her safe performance of ADLs      Vision         Perception     Praxis      Pertinent Vitals/Pain Pain Assessment: No/denies pain     Hand Dominance Right   Extremity/Trunk Assessment Upper Extremity Assessment Upper Extremity Assessment: Overall WFL for tasks assessed   Lower Extremity Assessment Lower Extremity Assessment: Defer to PT evaluation   Cervical / Trunk Assessment Cervical / Trunk Assessment: Normal   Communication Communication Communication: HOH   Cognition Arousal/Alertness: Awake/alert Behavior During Therapy: WFL for tasks assessed/performed Overall Cognitive Status: Within Functional Limits for tasks assessed  General Comments  SpO2 90s on 15L HHFNC and 15L NRB. HR 70-90s. RR 20-30s. Taking NRB off at end of session and pt maintaining 93-88%.     Exercises     Shoulder Instructions      Home Living Family/patient expects to be discharged to:: Private residence Living Arrangements: Children Available Help at Discharge: Family;Available  PRN/intermittently Type of Home: House (Lives in "suite" above son's garage - access from inside house) Home Access: Level entry     Home Layout: Multi-level Alternate Level Stairs-Number of Steps: 14 Alternate Level Stairs-Rails: Right (built dog a ramp that is about rail height on L and she can hold on there) Bathroom Shower/Tub: Chief Strategy Officer: Standard     Home Equipment: Shower seat          Prior Functioning/Environment Level of Independence: Independent        Comments: Reports very independent - works in garden (currently working on both of her son's yard) , ambulates in community, drives a little        OT Problem List: Decreased strength;Decreased range of motion;Decreased activity tolerance;Impaired balance (sitting and/or standing);Decreased knowledge of use of DME or AE;Decreased knowledge of precautions;Cardiopulmonary status limiting activity      OT Treatment/Interventions: Self-care/ADL training;Therapeutic exercise;Energy conservation;DME and/or AE instruction;Therapeutic activities;Patient/family education    OT Goals(Current goals can be found in the care plan section) Acute Rehab OT Goals Patient Stated Goal: return home, return to gardening OT Goal Formulation: With patient Time For Goal Achievement: 07/12/20 Potential to Achieve Goals: Good  OT Frequency: Min 2X/week   Barriers to D/C:            Co-evaluation              AM-PAC OT "6 Clicks" Daily Activity     Outcome Measure Help from another person eating meals?: A Little Help from another person taking care of personal grooming?: A Little Help from another person toileting, which includes using toliet, bedpan, or urinal?: A Little Help from another person bathing (including washing, rinsing, drying)?: A Little Help from another person to put on and taking off regular upper body clothing?: A Little Help from another person to put on and taking off regular lower  body clothing?: A Little 6 Click Score: 18   End of Session Equipment Utilized During Treatment: Oxygen Nurse Communication: Mobility status  Activity Tolerance: Patient tolerated treatment well Patient left: in chair;with call bell/phone within reach  OT Visit Diagnosis: Unsteadiness on feet (R26.81);Other abnormalities of gait and mobility (R26.89);Muscle weakness (generalized) (M62.81)                Time: 8469-6295 OT Time Calculation (min): 22 min Charges:  OT General Charges $OT Visit: 1 Visit OT Evaluation $OT Eval Moderate Complexity: 1 Mod  Alexandrya Chim MSOT, OTR/L Acute Rehab Pager: [phone] Office: [phone]  Theodoro Grist Jaylynn Mcaleer 06/28/2020, 4:48 PM

## 2020-06-28 NOTE — Progress Notes (Signed)
Subjective:   Hospital day: 6  Overnight event: No acute event overnight.  Patient is sitting up in the reclining chair during examination.  She appears comfortable at rest but still tachypneic when speaking.  Her pulse ox read 91% on 20 L of heated high flow +15 L of nonrebreather.  Patient complaining of mid lower chest tightness, does not radiate, worse with deep breathing.  She denies nausea or vomiting.  Objective:  Vital signs in last 24 hours: Vitals:   06/27/20 2100 06/27/20 2117 06/28/20 0209 06/28/20 0502  BP:  134/67  (!) 135/57  Pulse: 64 78 71 68  Resp: 19 19 (!) 21 19  Temp:  98.6 F (37 C)  98.1 F (36.7 C)  TempSrc:  Oral  Axillary  SpO2: 97% 90% 91% 100%  Weight:      Height:       CBC Latest Ref Rng & Units 06/28/2020 06/27/2020 06/26/2020  WBC 4.0 - 10.5 K/uL 17.9(H) 14.7(H) 16.4(H)  Hemoglobin 12.0 - 15.0 g/dL 65.6 81.2 75.1  Hematocrit 36 - 46 % 38.1 40.4 39.7  Platelets 150 - 400 K/uL 777(H) 663(H) 538(H)   CMP Latest Ref Rng & Units 06/28/2020 06/26/2020 06/25/2020  Glucose 70 - 99 mg/dL 700(F) 749(S) 496(P)  BUN 8 - 23 mg/dL 23 12 16   Creatinine 0.44 - 1.00 mg/dL 5.91 6.38  Sodium 135 - 145 mmol/L 134(L) 134(L) 135  Potassium 3.5 - 5.1 mmol/L 5.0 4.1 3.4(L)  Chloride 98 - 111 mmol/L 101 102 98  CO2 22 - 32 mmol/L 23 22 25   Calcium 8.9 - 10.3 mg/dL 4.66) ) 5.9(D)  Total Protein 6.5 - 8.1 g/dL - 6.0(L) 6.4(L)  Total Bilirubin 0.3 - 1.2 mg/dL - 0.7 0.6  Alkaline Phos 38 - 126 U/L - 71 74  AST 15 - 41 U/L - 44(H) 74(H)  ALT 0 - 44 U/L - 86(H) 118(H)    Physical Exam  Physical Exam Constitutional:      Appearance: She is not toxic-appearing.  HENT:     Head: Normocephalic.  Eyes:     General:        Right eye: No discharge.        Left eye: No discharge.  Cardiovascular:     Rate and Rhythm: Normal rate and regular rhythm.     Heart sounds: Normal heart sounds.  Pulmonary:     Breath sounds: No wheezing.     Comments: Mild  crackles at bilateral lung bases Chest:     Chest wall: No tenderness.  Abdominal:     Palpations: Abdomen is soft.  Musculoskeletal:     Right lower leg: No edema.     Left lower leg: No edema.  Skin:    General: Skin is warm.  Neurological:     Mental Status: She is alert.  Psychiatric:        Mood and Affect: Mood normal.     Assessment/Plan: Vickie [name] is a 79 y.o. female with hx of hypothyroidism, hypertension, hyperlipidemia, CAD status post DES to LAD in 2017, presenting with acute hypoxic respiratory failure secondary to COVID-19 pneumonia.  Patient is not vaccinated against Covid.  Principal Problem:   Pneumonia due to COVID-19 virus Active Problems:   Hypothyroidism   Essential hypertension   Hyponatremia   Pressure injury of skin   Hypokalemia  Acute hypoxic respiratory failure secondary to COVID-19 pneumonia Patient tested positive for Covid on 10/27/121.  She is not vaccinated against Covid.  She finished 5-day course of remdesivir.  However patient still has high oxygen requirement with 15 L of nonrebreather +30 L of heated high flow to maintain oxygen saturation in the high 80s low 90s.  CTA on admission is negative for PE.  Procalcitonin was 0.27 on admission.  Repeat procalcitonin less than 0.10.  Repeat chest x-ray yesterday shows increased patchy opacities bilaterally.  Her oxygen saturation is in the low 90s on 20 L of heated high flow +15 L of nonrebreather.  No accessory muscle use.  We will decrease her Solu-Medrol to 60 mg twice daily.  Continue baricitinib.  I also encourage patient to self proning as much as tolerable. -Given her improving respiratory status, reduce Solu-Medrol 60 mg to 2 times daily -Continue baricitinib -Continue current oxygen regimen, titrate down as tolerable -Incentive spirometry, flutter valve -Tylenol as needed for fever or pain -Continue to trend inflammatory markers -CBC and BMP daily -Low threshold to call PCCM if  patient will require BiPAP or intubation -Self prone when able   Chest tightness Patient complains of chest tightness in the mid lower chest, not radiating, worse with deep breathing.  Will obtain EKG and troponin to rule out ACS.  CTA done on admission is negative for PE. -Pending EKG -Pending troponin   CAD HFpEF HLD -Continue home aspirin, Imdur, nebivolol -Resume Praluent at discharge   HTN -Continue home Imdur 60mg , lisinopril 10mg , nebivolol 10mg   Diet: Heart healthy diet IVF: No VTE: Lovenox CODE: Full  , DO 06/28/2020, 5:47 AM Pager: [phone] After 5pm on weekdays and 1pm on weekends: On Call pager [phone]

## 2020-06-29 LAB — CBC WITH DIFFERENTIAL/PLATELET
Abs Immature Granulocytes: 0.18 10*3/uL — ABNORMAL HIGH (ref 0.00–0.07)
Basophils Absolute: 0 10*3/uL (ref 0.0–0.1)
Basophils Relative: 0 %
Eosinophils Absolute: 0 10*3/uL (ref 0.0–0.5)
Eosinophils Relative: 0 %
HCT: 39.9 % (ref 36.0–46.0)
Hemoglobin: 13.4 g/dL (ref 12.0–15.0)
Immature Granulocytes: 1 %
Lymphocytes Relative: 5 %
Lymphs Abs: 0.7 10*3/uL (ref 0.7–4.0)
MCH: 30.9 pg (ref 26.0–34.0)
MCHC: 33.6 g/dL (ref 30.0–36.0)
MCV: 91.9 fL (ref 80.0–100.0)
Monocytes Absolute: 0.4 10*3/uL (ref 0.1–1.0)
Monocytes Relative: 3 %
Neutro Abs: 12.2 10*3/uL — ABNORMAL HIGH (ref 1.7–7.7)
Neutrophils Relative %: 91 %
Platelets: 756 10*3/uL — ABNORMAL HIGH (ref 150–400)
RBC: 4.34 MIL/uL (ref 3.87–5.11)
RDW: 12.4 % (ref 11.5–15.5)
WBC: 13.5 10*3/uL — ABNORMAL HIGH (ref 4.0–10.5)
nRBC: 0 % (ref 0.0–0.2)

## 2020-06-29 LAB — D-DIMER, QUANTITATIVE: D-Dimer, Quant: <0.27 ug/mL-FEU (ref 0.00–0.50)

## 2020-06-29 LAB — GLUCOSE, CAPILLARY
Glucose-Capillary: 117 mg/dL — ABNORMAL HIGH (ref 70–99)
Glucose-Capillary: 181 mg/dL — ABNORMAL HIGH (ref 70–99)
Glucose-Capillary: 194 mg/dL — ABNORMAL HIGH (ref 70–99)
Glucose-Capillary: 199 mg/dL — ABNORMAL HIGH (ref 70–99)

## 2020-06-29 LAB — BASIC METABOLIC PANEL
Anion gap: 10 (ref 5–15)
BUN: 21 mg/dL (ref 8–23)
CO2: 23 mmol/L (ref 22–32)
Calcium: 8.6 mg/dL — ABNORMAL LOW (ref 8.9–10.3)
Chloride: 101 mmol/L (ref 98–111)
Creatinine, Ser: 0.7 mg/dL (ref 0.44–1.00)
GFR, Estimated: >60 mL/min (ref >60)
Glucose, Bld: 149 mg/dL — ABNORMAL HIGH (ref 70–99)
Potassium: 5.3 mmol/L — ABNORMAL HIGH (ref 3.5–5.1)
Sodium: 134 mmol/L — ABNORMAL LOW (ref 135–145)

## 2020-06-29 LAB — C-REACTIVE PROTEIN: CRP: 1.3 mg/dL — ABNORMAL HIGH (ref <1.0)

## 2020-06-29 LAB — SEDIMENTATION RATE: Sed Rate: 13 mm/hr (ref 0–22)

## 2020-06-29 LAB — FIBRINOGEN: Fibrinogen: 548 mg/dL — ABNORMAL HIGH (ref 210–475)

## 2020-06-29 MED ORDER — DEXAMETHASONE 6 MG PO TABS
6.0000 mg | ORAL_TABLET | Freq: Every day | ORAL | Status: DC
  Administered 2020-06-30: 6 mg via ORAL
  Filled 2020-06-29: qty 1

## 2020-06-29 MED ORDER — SODIUM ZIRCONIUM CYCLOSILICATE 10 G PO PACK
10.0000 g | PACK | Freq: Once | ORAL | Status: AC
  Administered 2020-06-29: 10 g via ORAL
  Filled 2020-06-29: qty 1

## 2020-06-29 MED ORDER — METHYLPREDNISOLONE SODIUM SUCC 125 MG IJ SOLR: 60.0000 mg | Freq: Every day | INTRAMUSCULAR | Status: DC

## 2020-06-29 NOTE — Consult Note (Signed)
   [facility] Inpatient Consult   06/29/2020  Vickie [name] 08/02/1941 [id number]   [facility] [[facility]]  Accountable Care Organization [ACO] Patient:  Medicare [employer]  [facility] Emb[facility]ded [facility]   Patient screened for lenght of stay ][7 days]  hospitalizations to check if potential [facility]  [[facility]] Care Management service needs.  Review of patient's medical record reveals patient is weaning oxygen down, has been high flow due to COVID-19 Pneumonia.  Primary Care Provider is Inez Catalina, MD this provider is an Embedded provider listed to provide the transition of care [TOC] for post hospital follow up.  Plan:  Continue to follow progress and disposition to assess alert the Embedded Chronic Care Management of any post hospital needs, when appropriate. For questions contact:   Charlesetta Shanks, RN BSN CCM [facility]  [phone] business mobile phone Toll free office [phone]  Fax number: [phone] [email or web address]

## 2020-06-29 NOTE — TOC Initial Note (Signed)
Transition of Care [facility]) - Initial/Assessment Note    Patient Details  Name: Vickie [name] MRN: [id number] Date of Birth: [birth year]  Transition of Care [facility]) CM/SW Contact:    Lawerance Sabal, RN Phone Number: 06/29/2020, 4:11 PM  Clinical Narrative:       Spoke to patient over the phone. She states that she lives at home, in an apartment above her son's garage. Deogrpahics verified.  She has a shower chair, no other DME. She states that her son will be able to assist with transportation home, "He will have to." [facility] will need to verify this with son closer to [place]. Anticipate patient will need home oxygen and home health services. She does not have a preference for a [facility] company, will need referral and orders closer to [place].            Expected Discharge Plan: Home w Home Health Services Barriers to Discharge: Continued Medical Work up   Patient Goals and CMS Choice Patient states their goals for this hospitalization and ongoing recovery are:: to go home CMS Medicare.gov Compare Post Acute Care list provided to:: Patient Choice offered to / list presented to : Patient  Expected Discharge Plan and Services Expected Discharge Plan: Home w Home Health Services   Discharge Planning Services: CM Consult Post Acute Care Choice: Home Health, Durable Medical Equipment Living arrangements for the past 2 months: Apartment (above the garage of her son's house) Expected Discharge Date: 06/23/20                                    Prior Living Arrangements/Services Living arrangements for the past 2 months: Apartment (above the garage of her son's house) Lives with:: Adult Children                   Activities of Daily Living Home Assistive Devices/Equipment: None ADL Screening (condition at time of admission) Patient's cognitive ability adequate to safely complete daily activities?: Yes Is the patient deaf or have difficulty hearing?: Yes Does the patient have difficulty  seeing, even when wearing glasses/contacts?: Yes Does the patient have difficulty concentrating, remembering, or making decisions?: No Patient able to express need for assistance with ADLs?: Yes Does the patient have difficulty dressing or bathing?: Yes Independently performs ADLs?: Yes (appropriate for developmental age) Does the patient have difficulty walking or climbing stairs?: Yes Weakness of Legs: Both Weakness of Arms/Hands: Both  Permission Sought/Granted                  Emotional Assessment              Admission diagnosis:  Hypokalemia [E87.6] Pneumonia due to COVID-19 virus [U07.1, J12.82] Patient Active Problem List   Diagnosis Date Noted  . Pneumonia due to COVID-19 virus 06/22/2020  . Hyponatremia 06/22/2020  . Pressure injury of skin 06/22/2020  . Hypokalemia   . Chronic constipation 06/09/2018  . Diastolic dysfunction without heart failure 01/20/2016  . CAD in native artery 01/07/2016  . Hyperlipidemia 07/03/2013  . Overweight (BMI 25.0-29.9) 07/03/2013  . Healthcare maintenance 07/03/2013  . Gastroesophageal reflux disease 07/03/2013  . Chronic urticaria 03/16/2013  . Hypothyroidism 08/14/2006  . Essential hypertension 08/14/2006   PCP:  Inez Catalina, MD Pharmacy:   [id number] - [place], [place] - [address] AT [address]OSS [address] [place] [address] Phone: [phone] Fax:  [phone]  [facility] - [place], [place] - [address] [place] [address] Phone: [phone] Fax: [phone]     Social Determinants of Health (SDOH) Interventions    Readmission Risk Interventions No flowsheet data found.

## 2020-06-29 NOTE — Progress Notes (Signed)
Physical Therapy Treatment Patient Details Name: Vickie [name] MRN: [id number] DOB: [birth year] Today's Date: 06/29/2020    History of Present Illness Pt is 79 yo female with hx of CAD, HF, HTN, HLD, hypothyroidism, and GERD.  Pt admitted with [facility], resp failure, secondary to COVID 19 PNE.  Early on 06/24/20 pt with significant event requiring O2 increased to 15 L HFNC (from 3 LPM).  Her chest CT was negative for PE and she was weaned back to 6 LPM.  Pt had another episode and increased to heated high flow oxygen but is again back down to 4 LPM O2.    PT Comments    Pt on significantly less O2 than last visit (from 25L HHFNC to 4 LPM [place])and was able to increase gait distance with sats dropping to 86% at lowest with quick recovery.  Pt making good progress with therapy.  Continue POC.     Follow Up Recommendations  Home health PT;Supervision/Assistance - 24 hour     Equipment Recommendations  None recommended by PT    Recommendations for Other Services       Precautions / Restrictions Precautions Precautions: Fall Precaution Comments: watch O2    Mobility  Bed Mobility               General bed mobility comments: In recliner upon arrival  Transfers Overall transfer level: Needs assistance Equipment used: None Transfers: Sit to/from [employer] Sit to Stand: Min guard Stand pivot transfers: Min guard       General transfer comment: Min Guard A for safety; performed each x 3; performed toileting ADLs independently  Ambulation/Gait Ambulation/Gait assistance: Min guard Gait Distance (Feet): 120 Feet Assistive device: None Gait Pattern/deviations: Step-to pattern;Decreased stride length Gait velocity: decreased   General Gait Details: Pt with small steps and min guard for steadying.  Min cues for posture and pursed lip breathing.   Stairs             Wheelchair Mobility    Modified Rankin (Stroke Patients Only)       Balance  Overall balance assessment: Needs assistance Sitting-balance support: No upper extremity supported Sitting balance-Leahy Scale: Good     Standing balance support: No upper extremity supported Standing balance-Leahy Scale: Good                              Cognition Arousal/Alertness: Awake/alert Behavior During Therapy: WFL for tasks assessed/performed Overall Cognitive Status: Within Functional Limits for tasks assessed                                        Exercises      General Comments General comments (skin integrity, edema, etc.): Pt on 4 LPM O2 at arrival with sats 94%.  Increased to 6 LPM for ambulation with sats mostly in low 90's but did decrease to 86% toward the end of ambulation.   Allowed pt to recover and then decreased back to 4 LPM with sats 94%.  Further activity not performed due to lunch arriving.      Pertinent Vitals/Pain Pain Assessment: No/denies pain    Home Living                      Prior Function            PT Goals (current  goals can now be found in the care plan section) Acute Rehab PT Goals Patient Stated Goal: return home, return to gardening PT Goal Formulation: With patient Time For Goal Achievement: 07/06/20 Potential to Achieve Goals: Good Progress towards PT goals: Progressing toward goals    Frequency    Min 3X/week      PT Plan Current plan remains appropriate    Co-evaluation              AM-PAC PT "6 Clicks" Mobility   Outcome Measure  Help needed turning from your back to your side while in a flat bed without using bedrails?: A Little Help needed moving from lying on your back to sitting on the side of a flat bed without using bedrails?: A Little Help needed moving to and from a bed to a chair (including a wheelchair)?: A Little Help needed standing up from a chair using your arms (e.g., wheelchair or bedside chair)?: A Little Help needed to walk in hospital room?: A  Little Help needed climbing 3-5 steps with a railing? : A Little 6 Click Score: 18    End of Session Equipment Utilized During Treatment: Oxygen;Gait belt Activity Tolerance: Patient tolerated treatment well Patient left: with call bell/phone within reach;in chair;with chair alarm set Nurse Communication: Mobility status PT Visit Diagnosis: Other abnormalities of gait and mobility (R26.89);Muscle weakness (generalized) (M62.81)     Time: 1448-1856 PT Time Calculation (min) (ACUTE ONLY): 18 min  Charges:  $Gait Training: 8-22 mins                     Anise Salvo, PT Acute Rehab Services Pager [phone] [facility] [phone]     Vickie [name] 06/29/2020, 4:24 PM

## 2020-06-29 NOTE — Care Management Important Message (Signed)
Important Message  Patient Details  Name: Vickie [name] MRN: [id number] Date of Birth: April 28, 1941   Medicare Important Message Given:  Yes - Important Message mailed due to current National Emergency  Verbal consent obtained due to current National Emergency  Relationship to patient: Self Contact Name: Tamsyn Owusu Call Date: 06/29/20  Time: 1012 Phone: [phone] Outcome: No Answer/Busy Important Message mailed to: Patient address on file    Orson Aloe 06/29/2020, 10:13 AM

## 2020-06-29 NOTE — Progress Notes (Signed)
Subjective:   Hospital day: 7  Overnight event: No acute event overnight  Patient sitting on reclining chair during semination.  She appears comfortable with no acute distress, no tachypnea or accessory muscle use.  She states that this is the best morning that she ever felt.  Is currently on 5 L oxygen only and O2 sat is 3%.  She said that her chest tightness still persist especially after taking a deep breath.  No pain after eating.  Objective:  Vital signs in last 24 hours: Vitals:   06/28/20 1750 06/28/20 2042 06/28/20 2053 06/29/20 0547  BP:  (!) 147/64 (!) 147/46 (!) 151/73  Pulse: 72 86 73 74  Resp: 14 (!) 22 (!) 23 18  Temp:  97.8 F (36.6 C)  97.8 F (36.6 C)  TempSrc:  Axillary  Axillary  SpO2: 96% 90% 100% 100%  Weight:      Height:       CBC Latest Ref Rng & Units 06/29/2020 06/28/2020 06/27/2020  WBC 4.0 - 10.5 K/uL 13.5(H) 17.9(H) 14.7(H)  Hemoglobin 12.0 - 15.0 g/dL 51.0 25.8 52.7  Hematocrit 36 - 46 % 39.9 38.1 40.4  Platelets 150 - 400 K/uL 756(H) 777(H) 663(H)   CMP Latest Ref Rng & Units 06/29/2020 06/28/2020 06/26/2020  Glucose 70 - 99 mg/dL 782(U) 235(T) 614(E)  BUN 8 - 23 mg/dL 21 23 12   Creatinine 0.44 - 1.00 mg/dL 3.15 4.00  Sodium 135 - 145 mmol/L 134(L) 134(L) 134(L)  Potassium 3.5 - 5.1 mmol/L 5.3(H) 5.0 4.1  Chloride 98 - 111 mmol/L 101 101 102  CO2 22 - 32 mmol/L 23 23 22   Calcium 8.9 - 10.3 mg/dL 8.67) ) 6.1(P)  Total Protein 6.5 - 8.1 g/dL - - 6.0(L)  Total Bilirubin 0.3 - 1.2 mg/dL - - 0.7  Alkaline Phos 38 - 126 U/L - - 71  AST 15 - 41 U/L - - 44(H)  ALT 0 - 44 U/L - - 86(H)    Physical Exam  Physical Exam Constitutional:      General: She is not in acute distress.    Appearance: She is not toxic-appearing.  HENT:     Head: Normocephalic.  Eyes:     General:        Right eye: No discharge.        Left eye: No discharge.  Cardiovascular:     Rate and Rhythm: Normal rate and regular rhythm.  Pulmonary:     Effort:  No respiratory distress.     Comments: Mild crackles noted on the right side. No tachypnea No accessory muscle use Chest:     Chest wall: No tenderness.  Abdominal:     Palpations: Abdomen is soft.     Tenderness: There is no abdominal tenderness.  Musculoskeletal:     Cervical back: Normal range of motion.     Right lower leg: No edema.     Left lower leg: No edema.  Skin:    General: Skin is warm.  Neurological:     Mental Status: She is alert.  Psychiatric:        Mood and Affect: Mood normal.     Assessment/Plan: SHAWNICE TILMON a 79 y.o.femalewith hx of hypothyroidism, hypertension, hyperlipidemia, CAD status post DES to LAD in 2017,presenting withacute hypoxic respiratory failure secondary to COVID-19 pneumonia. Her oxygen requirement decreased to 5L.   Principal Problem:   Pneumonia due to COVID-19 virus Active Problems:   Hypothyroidism   Essential hypertension  Hyponatremia   Pressure injury of skin   Hypokalemia  Acute hypoxic respiratory failure secondary to COVID-19 pneumonia Patient tested positive for Covid on 10/27/121. She is not vaccinated against Covid. She finished 5-day course of remdesivir. However patient still has high oxygen requirement with 15 L of nonrebreather +30 L of heated high flow to maintain oxygen saturation in the high 80s low 90s. CTA on admission is negative for PE. Her oxygen requirement decreased to 5 L and O2 sat was in the 93%.  Patient is nondistressed, no tachypneic, no accessory muscle use.  Her inflammatory markers also trending down. -Given her improving respiratory status, reduce Solu-Medrol 60 mg to once daily and will transition to Decadron tomorrow. -Continue baricitinib -Continue current oxygen regimen, titrate down as tolerable -Incentive spirometry, flutter valve -Tylenol as needed for fever or pain -Continue to trend inflammatory markers -CBC and BMP daily -Low threshold to call PCCM if patient will  require BiPAP or intubation -Self prone when able   Chest tightness Patient complains of chest tightness in the mid lower chest, not radiating, worse with deep breathing.  CTA done on admission is negative for PE.  EKG does not show any obvious ischemia.  Troponin was unremarkable.  Low suspicion for ACS.  This is likely musculoskeletal pain due to coughing. -Tylenol for pain and fever   Hyperkalemia Potassium elevated 5.3 today.  This could be related to her lisinopril so holding lisinopril.  Start Lokelma 10.  Start renal diet with low potassium. -Hold lisinopril -1 dose of Lokelma 10 -Renal diet -CMP tomorrow morning   CAD HFpEF HLD -Continue home aspirin, Imdur, nebivolol -Resume Praluent at discharge   HTN -Hold home lisinopril -Continue home Imdur 60mg , nebivolol 10mg   Diet:Renal diet  IVF:No CODE:Full  Prior to Admission Living Arrangement: Home Anticipated Discharge Location: Home Barriers to Discharge: Oxygen requirement Dispo: Anticipated discharge in approximately 1-2 day(s).   , DO 06/29/2020, 6:15 AM Pager: [phone] After 5pm on weekdays and 1pm on weekends: On Call pager [phone]

## 2020-06-30 LAB — CBC WITH DIFFERENTIAL/PLATELET
Abs Immature Granulocytes: 0.25 10*3/uL — ABNORMAL HIGH (ref 0.00–0.07)
Basophils Absolute: 0 10*3/uL (ref 0.0–0.1)
Basophils Relative: 0 %
Eosinophils Absolute: 0 10*3/uL (ref 0.0–0.5)
Eosinophils Relative: 0 %
HCT: 39 % (ref 36.0–46.0)
Hemoglobin: 13.1 g/dL (ref 12.0–15.0)
Immature Granulocytes: 2 %
Lymphocytes Relative: 11 %
Lymphs Abs: 1.7 10*3/uL (ref 0.7–4.0)
MCH: 30.3 pg (ref 26.0–34.0)
MCHC: 33.6 g/dL (ref 30.0–36.0)
MCV: 90.3 fL (ref 80.0–100.0)
Monocytes Absolute: 0.6 10*3/uL (ref 0.1–1.0)
Monocytes Relative: 4 %
Neutro Abs: 12.7 10*3/uL — ABNORMAL HIGH (ref 1.7–7.7)
Neutrophils Relative %: 83 %
Platelets: 741 10*3/uL — ABNORMAL HIGH (ref 150–400)
RBC: 4.32 MIL/uL (ref 3.87–5.11)
RDW: 12.2 % (ref 11.5–15.5)
WBC: 15.3 10*3/uL — ABNORMAL HIGH (ref 4.0–10.5)
nRBC: 0 % (ref 0.0–0.2)

## 2020-06-30 LAB — BASIC METABOLIC PANEL
Anion gap: 8 (ref 5–15)
BUN: 19 mg/dL (ref 8–23)
CO2: 25 mmol/L (ref 22–32)
Calcium: 8.2 mg/dL — ABNORMAL LOW (ref 8.9–10.3)
Chloride: 100 mmol/L (ref 98–111)
Creatinine, Ser: 0.67 mg/dL (ref 0.44–1.00)
GFR, Estimated: >60 mL/min (ref >60)
Glucose, Bld: 88 mg/dL (ref 70–99)
Potassium: 4.1 mmol/L (ref 3.5–5.1)
Sodium: 133 mmol/L — ABNORMAL LOW (ref 135–145)

## 2020-06-30 LAB — FIBRINOGEN: Fibrinogen: 397 mg/dL (ref 210–475)

## 2020-06-30 LAB — SEDIMENTATION RATE: Sed Rate: 10 mm/hr (ref 0–22)

## 2020-06-30 LAB — C-REACTIVE PROTEIN: CRP: 0.7 mg/dL (ref <1.0)

## 2020-06-30 LAB — D-DIMER, QUANTITATIVE: D-Dimer, Quant: 1.1 ug/mL-FEU — ABNORMAL HIGH (ref 0.00–0.50)

## 2020-06-30 LAB — GLUCOSE, CAPILLARY
Glucose-Capillary: 83 mg/dL (ref 70–99)
Glucose-Capillary: 93 mg/dL (ref 70–99)

## 2020-06-30 MED ORDER — ORAL CARE MOUTH RINSE
15.0000 mL | Freq: Two times a day (BID) | OROMUCOSAL | Status: DC
  Administered 2020-06-30: 15 mL via OROMUCOSAL

## 2020-06-30 MED ORDER — DEXAMETHASONE 6 MG PO TABS: 6.0000 mg | ORAL_TABLET | Freq: Once | ORAL | 0 refills | Status: AC

## 2020-06-30 NOTE — Progress Notes (Signed)
Pt given discharge instructions, prescriptions, and care notes. Home O2 delivered to bedside and Pt educated on how to use. Pt verbalized understanding AEB no further questions or concerns at this time. IV was discontinued, no redness, pain, or swelling noted at this time. Telemetry discontinued and Centralized Telemetry was notified. Pt waiting on son to pick her up, ETA 8pm.

## 2020-06-30 NOTE — Progress Notes (Signed)
Subjective:   Hospital day: 8  Overnight event: No acute event overnight  Patient sitting on recliner chair during examination.  She appears comfortable in no acute distress.  Oxygen status is in the high 90s on 2 L of oxygen.  Her oxygen saturation dropped when she speaks.  She states that her chest tightness has gotten better and is not consistent.  Objective:  Vital signs in last 24 hours: Vitals:   06/29/20 2245 06/29/20 2301 06/30/20 0100 06/30/20 0521  BP:    (!) 145/71  Pulse: 77 80  87  Resp: (!) 22 15  20   Temp:    97.9 F (36.6 C)  TempSrc:    Axillary  SpO2: 91% 96% 95% 92%  Weight:      Height:       CBC Latest Ref Rng & Units 06/30/2020 06/29/2020 06/28/2020  WBC 4.0 - 10.5 K/uL 15.3(H) 13.5(H) 17.9(H)  Hemoglobin 12.0 - 15.0 g/dL 13/09/2019 31.5 40.0  Hematocrit 36 - 46 % 39.0 39.9 38.1  Platelets 150 - 400 K/uL 741(H) 756(H) 777(H)   CMP Latest Ref Rng & Units 06/30/2020 06/29/2020 06/28/2020  Glucose 70 - 99 mg/dL 88 13/09/2019) 619(J)  BUN 8 - 23 mg/dL 19 21 23   Creatinine 0.44 - 1.00 mg/dL 093(O 6.71  Sodium 135 - 145 mmol/L 133(L) 134(L) 134(L)  Potassium 3.5 - 5.1 mmol/L 4.1 5.3(H) 5.0  Chloride 98 - 111 mmol/L 100 101 101  CO2 22 - 32 mmol/L 25 23 23   Calcium 8.9 - 10.3 mg/dL 8.2(L) 8.6(L) 8.6(L)  Total Protein 6.5 - 8.1 g/dL - - -  Total Bilirubin 0.3 - 1.2 mg/dL - - -  Alkaline Phos 38 - 126 U/L - - -  AST 15 - 41 U/L - - -  ALT 0 - 44 U/L - - -    Physical Exam  Physical Exam Constitutional:      General: She is not in acute distress.    Appearance: She is not toxic-appearing.  HENT:     Head: Normocephalic.  Eyes:     General:        Right eye: No discharge.        Left eye: No discharge.  Cardiovascular:     Rate and Rhythm: Normal rate and regular rhythm.  Pulmonary:     Effort: Pulmonary effort is normal. No respiratory distress.     Comments: Improved air movement bilaterally Abdominal:     General: Bowel sounds are normal.      Palpations: Abdomen is soft.  Musculoskeletal:     Cervical back: Normal range of motion.     Right lower leg: No edema.     Left lower leg: No edema.  Skin:    General: Skin is warm.     Coloration: Skin is not jaundiced.  Neurological:     Mental Status: She is alert.     Assessment/Plan: Vickie [name] a 79 y.o.femalewith hx of hypothyroidism, hypertension, hyperlipidemia, CAD status post DES to LAD in 2017,presenting withacute hypoxic respiratory failure secondary to COVID-19 pneumonia.  Principal Problem:   Pneumonia due to COVID-19 virus Active Problems:   Hypothyroidism   Essential hypertension   Hyponatremia   Pressure injury of skin   Hypokalemia  Acute hypoxic respiratory failure secondary to COVID-19 pneumonia Patient tested positive for Covid on 10/26/121. She is not vaccinated against Covid. She finished 5-day course of remdesivir. CTA on admission is negative for PE. Her oxygen requirement decreased to  2 L and O2 sat was in the high 90s. Patient is nondistressed, no tachypneic, no accessory muscle use.  Her inflammatory markers continue to trend down.  Patient is stable for discharge.  She will go home with home health PT and oxygen of 4 L. -Decadron 6 mg today.  Patient will finish her last dose of Decadron at home   Chest tightness Patient complains of chest tightness in the mid lower chest, not radiating, worse with deep breathing. CTA done on admission is negative for PE.  EKG does not show any obvious ischemia.  Troponin was unremarkable.  Low suspicion for ACS.  This is likely musculoskeletal pain due to coughing. -Tylenol for pain and fever   Hyperkalemia-resolved   CAD HFpEF HLD -Continue home aspirin, Imdur, nebivolol -Resume Praluent at discharge   HTN -Hold home lisinopril -Continue home Imdur 60mg , nebivolol 10mg   Diet:Renal diet  IVF:No CODE:Full  Prior to Admission Living Arrangement:  Home Anticipated Discharge Location: Home with home health Barriers to Discharge: No Dispo: Anticipated discharge in approximately today  , DO 06/30/2020, 7:05 AM Pager: [phone] After 5pm on weekdays and 1pm on weekends: On Call pager [phone]

## 2020-06-30 NOTE — Progress Notes (Addendum)
SATURATION QUALIFICATIONS: (This note is used to comply with regulatory documentation for home oxygen)  Patient Saturations on 2 Liters at Rest = 97%  Patient Saturations on Room Air at Rest = 91%  Patient Saturations on Room Air while Ambulating = 86%  Patient Saturations on 2 Liters of oxygen while Ambulating = 95-89%  Please briefly explain why patient needs home oxygen: Pt able to perform BADLs on room air and SpO2 dropping to 86%.Pt very fatigued and presenting with shortness of breath. Benefits from 2L to maintain SpO2 >89% during activity and perform ADLs and mobility safely.   Kiowa Peifer MSOT, OTR/L Acute Rehab Pager: [phone] Office: [phone]

## 2020-06-30 NOTE — Progress Notes (Addendum)
Patient's O2 titrated down to 2L . SpO2 maintained high 80's. O2 increased to 3L. Patient got up to [facility] with assistance. SpO2 dropped to 81% on 3L, but increased to 92% within 4 minutes. Patient had taken O2 off to blow her nose. SpO2 noted as low as 79%. SpO2 back up to 90% on 3L within 10 minutes. Patient was up in chair at beginning of shift. Patient has been laying mostly prone since 2000. Will continue to monitor.

## 2020-06-30 NOTE — Discharge Summary (Signed)
Name: Vickie [name] MRN: [id number] DOB: [birth year] 79 y.o. PCP: Inez Catalina, MD  Date of Admission: 06/21/2020 10:17 PM Date of Discharge:  Attending Physician: Earl Lagos, MD  Discharge Diagnosis: 1.  COVID-19 infection  Discharge Medications: Allergies as of 06/30/2020      Reactions   Atorvastatin Other (See Comments)   Elevated liver functions numbers   Rosuvastatin Other (See Comments)   Constipation      Medication List    TAKE these medications   aspirin 81 MG tablet Take 1 tablet (81 mg total) by mouth daily.   dexamethasone 6 MG tablet Commonly known as: DECADRON Take 1 tablet (6 mg total) by mouth once for 1 dose.   ibuprofen 200 MG tablet Commonly known as: ADVIL Take 200-400 mg by mouth every 6 (six) hours as needed for mild pain (or discomfort).   isosorbide mononitrate 60 MG 24 hr tablet Commonly known as: IMDUR Take 1 tablet (60 mg total) by mouth daily.   levothyroxine 88 MCG tablet Commonly known as: SYNTHROID Take 1 tablet (88 mcg total) by mouth daily before breakfast.   lisinopril 10 MG tablet Commonly known as: ZESTRIL Take 1 tablet (10 mg total) by mouth daily.   nebivolol 10 MG tablet Commonly known as: Bystolic Take 1 tablet (10 mg total) by mouth daily. KE   nitroGLYCERIN 0.4 MG SL tablet Commonly known as: NITROSTAT Place 1 tablet (0.4 mg total) under the tongue every 5 (five) minutes as needed for chest pain.   Praluent 75 MG/ML Soaj Generic drug: Alirocumab Inject 75 mg into the skin every 14 (fourteen) days.            Durable Medical Equipment  (From admission, onward)         Start     Ordered   06/30/20 1156  DME Oxygen  Once       Question Answer Comment  Length of Need 6 Months   Mode or (Route) Nasal cannula   Liters per Minute 4   Oxygen conserving device Yes   Oxygen delivery system Gas      06/30/20 1158   06/23/20 0000  For home use only DME oxygen       Question Answer Comment    Length of Need 6 Months   Mode or (Route) Nasal cannula   Liters per Minute 3   Frequency Continuous (stationary and portable oxygen unit needed)   Oxygen conserving device Yes   Oxygen delivery system Gas      06/23/20 1657           Discharge Care Instructions  (From admission, onward)         Start     Ordered   06/30/20 0000  Discharge wound care:       Comments: Per wound care nurse   06/30/20 1158   06/23/20 0000  Discharge wound care:       Comments: For your pressure injury on your right buttocks, continue frequent position changes to prevent further pressure injury to this area.   06/23/20 1657          Disposition and follow-up:   Vickie [name] was discharged from [facility] in Stable condition.  At the hospital follow up visit please address:  1.  Dyspnea on exertion due to COVID-19.  Assess for oxygen need  2.  Labs / imaging needed at time of follow-up: N/A  3.  Pending labs/ test needing follow-up:  N/A  Follow-up Appointments:  Follow-up Information    Inez Catalina, MD Follow up in 7 day(s).   Specialty: Internal Medicine Why: Make appointment in a week for follow up Contact information: [address] [place] [address] [phone]               Hospital Course by problem list: 1.   Acute hypoxic respiratory failure secondary to COVID-19 pneumonia. Patient came in the ED for worsening shortness of breath 3 days prior with associated body aches and generalized weakness.  Positive Covid test on 06/21/2020.  She was placed on 2 L of nasal cannula on admission and satting in the mid 90s.  Remdesivir and Decadron were started.  On day 4 however patient started to require more oxygen up to 15 L of nonrebreather and 15 L of heated high flow.  CTA was negative for pulmonary embolism.  Patient finished 5 days remdesivir.  On day 6 of steroid, Solu-Medrol 60 mg 3 times daily was started due to her worsening respiratory  status.  Baricitinib was also started.  Patient slowly improves and her steroids is slowly taper.  On discharge, patient only requires 2 L of nasal cannula.  She will be discharged home with home health and oxygen supplement.  Finished her last dose of Decadron at home.  She will follow up with a telehealth visit with the internal medicine clinic in 1 week.  Discharge Vitals:   BP 128/75 (BP Location: Left Arm)   Pulse 96   Temp 98.7 F (37.1 C) (Oral)   Resp 20   Ht 5' (1.524 m)   Wt 67 kg   LMP 10/20/1991   SpO2 96%   BMI 28.85 kg/m   Pertinent Labs, Studies, and Procedures:  CBC Latest Ref Rng & Units 06/30/2020 06/29/2020 06/28/2020  WBC 4.0 - 10.5 K/uL 15.3(H) 13.5(H) 17.9(H)  Hemoglobin 12.0 - 15.0 g/dL 82.5 05.3 97.6  Hematocrit 36 - 46 % 39.0 39.9 38.1  Platelets 150 - 400 K/uL 741(H) 756(H) 777(H)   CMP Latest Ref Rng & Units 06/30/2020 06/29/2020 06/28/2020  Glucose 70 - 99 mg/dL 88 734(L) 937(T)  BUN 8 - 23 mg/dL 19 21 23   Creatinine 0.44 - 1.00 mg/dL 0.24 0.97  Sodium 135 - 145 mmol/L 133(L) 134(L) 134(L)  Potassium 3.5 - 5.1 mmol/L 4.1 5.3(H) 5.0  Chloride 98 - 111 mmol/L 100 101 101  CO2 22 - 32 mmol/L 25 23 23   Calcium 8.9 - 10.3 mg/dL 8.2(L) 8.6(L) 8.6(L)  Total Protein 6.5 - 8.1 g/dL - - -  Total Bilirubin 0.3 - 1.2 mg/dL - - -  Alkaline Phos 38 - 126 U/L - - -  AST 15 - 41 U/L - - -  ALT 0 - 44 U/L - - -   DG Chest Portable 1 View  Result Date: 06/21/2020 CLINICAL DATA:  Shortness of breath and COVID EXAM: PORTABLE CHEST 1 VIEW COMPARISON:  None. FINDINGS: The heart size and mediastinal contours are within normal limits. Hazy airspace opacity seen at the periphery of the left mid lung and right lower lung. No pleural effusion. Aortic knob calcifications are seen. The visualized skeletal structures are unremarkable. IMPRESSION: Mildly hazy airspace opacity seen within the lower lungs which could be due to atelectasis and/or early infectious etiology.  Electronically Signed   By: M.D.   On: 06/21/2020 23:02    Discharge Instructions: Discharge Instructions    Call MD for:  difficulty breathing, headache  or visual disturbances   Complete by: As directed    Call MD for:  persistant dizziness or light-headedness   Complete by: As directed    Call MD for:  severe uncontrolled pain   Complete by: As directed    Call MD for:  temperature >100.4   Complete by: As directed    Call MD for:  temperature >100.4   Complete by: As directed    Diet - low sodium heart healthy   Complete by: As directed    Discharge instructions   Complete by: As directed    Vickie [name], it is a pleasure taking care of you during this admission.  You were hospitalized for COVID-19 infection requiring oxygen supplement.  You will go home with home health physical therapy and oxygen supplement.  I have sent a message to the internal medicine clinic to set up a telehealth visit with you. Please take the dexamethasone 6 mg for 1 more day. Resume your home medications as instructed.  Take care   Discharge wound care:   Complete by: As directed    For your pressure injury on your right buttocks, continue frequent position changes to prevent further pressure injury to this area.   Discharge wound care:   Complete by: As directed    Per wound care nurse   For home use only DME oxygen   Complete by: As directed    Length of Need: 6 Months   Mode or (Route): Nasal cannula   Liters per Minute: 3   Frequency: Continuous (stationary and portable oxygen unit needed)   Oxygen conserving device: Yes   Oxygen delivery system: Gas   Increase activity slowly   Complete by: As directed    Increase activity slowly   Complete by: As directed       Signed: Doran Stabler, DO 06/30/2020, 12:57 PM   Pager: [phone]

## 2020-06-30 NOTE — Progress Notes (Signed)
Patient discharged to home, left the unit at 2050 via wheelchair. Patient remians stable, alert and oriented. Patient placed on 2LNC oxygen tank. Oxygen concentrator also given to patient .  Patient assisted by Nurse Tech to the Son's car at [employer].

## 2020-06-30 NOTE — TOC Transition Note (Signed)
Transition of Care [facility]) - CM/SW Discharge Note   Patient Details  Name: Vickie [name] MRN: [id number] Date of Birth: 1940/12/17  Transition of Care [facility]) CM/SW Contact:  Mearl Latin, LCSW Phone Number: 06/30/2020, 4:54 PM   Clinical Narrative:    CSW received consult from [facility] that patient's son is requesting SNF. CSW spoke with patient. Patient reported that she wanted to go home and was upset by the thought of SNF. PT, Larita Fife was in the room with patient and reported that patient did well in their session and would be ok at home while family was at work. CSW contacted patient's son and he reported frustration with what has been told to him but he will come pick patient up at 8pm. CSW contacted [facility] and made them aware so they can reschedule her appointment. RN and patient aware.    Final next level of care: Home w Home Health Services Barriers to Discharge: No Barriers Identified   Patient Goals and CMS Choice Patient states their goals for this hospitalization and ongoing recovery are:: to go home CMS Medicare.gov Compare Post Acute Care list provided to:: Patient Choice offered to / list presented to : Patient  Discharge Placement                       Discharge Plan and Services   Discharge Planning Services: CM Consult Post Acute Care Choice: Home Health, Durable Medical Equipment          DME Arranged: Oxygen DME Agency: [employer] Date DME Agency Contacted: 06/30/20 Time DME Agency Contacted: 1400 Representative spoke with at DME Agency: Oletha Cruel HH Arranged: PT, OT   Date HH Agency Contacted: 06/30/20 Time HH Agency Contacted: 1206 Representative spoke with at [facility] Agency: Becky Sax  Social Determinants of Health (SDOH) Interventions     Readmission Risk Interventions No flowsheet data found.

## 2020-06-30 NOTE — TOC Progression Note (Signed)
Transition of Care [facility]) - Progression Note    Patient Details  Name: CAMEO SCHMIESING MRN: [id number] Date of Birth: May 31, 1941  Transition of Care [facility]) CM/SW Contact  Lockie Pares, RN Phone Number: 06/30/2020, 12:06 PM  Clinical Narrative:    Requested oxygen qualifications to be done to determine oxygen home need. OT will be evaluating patient and will be doing this evaluation shortly.  [facility] will be providing HH for Ms. Noralyn Pick. Awaiting MD orders. Awaiting Ot to make final evaluation of DME. Planning on discharge later today.    Expected Discharge Plan: Home w Home Health Services Barriers to Discharge: Continued Medical Work up  Expected Discharge Plan and Services Expected Discharge Plan: Home w Home Health Services   Discharge Planning Services: CM Consult Post Acute Care Choice: Home Health, Durable Medical Equipment Living arrangements for the past 2 months: Apartment (above the garage of her son's house) Expected Discharge Date: 06/30/20                         HH Arranged: PT, OT   Date HH Agency Contacted: 06/30/20 Time HH Agency Contacted: 1206 Representative spoke with at [facility] Agency: Becky Sax   Social Determinants of Health (SDOH) Interventions    Readmission Risk Interventions No flowsheet data found.

## 2020-06-30 NOTE — Progress Notes (Signed)
Occupational Therapy Treatment Patient Details Name: Vickie [name] MRN: [id number] DOB: 1940/10/13 Today's Date: 06/30/2020    History of present illness Pt is 79 yo female with hx of CAD, HF, HTN, HLD, hypothyroidism, and GERD.  Pt admitted with [facility], resp failure, secondary to COVID 19 PNE.  Early on 06/24/20 pt with significant event requiring O2 increased to 15 L HFNC (from 3 LPM).  Her chest CT was negative for PE and she was weaned back to 6 LPM.  Pt had another episode and increased to heated high flow oxygen but is again back down to 4 LPM O2.   OT comments  Pt progressing towards established OT goals. Pt performing functional mobility to/from restroom with Min guard A. Pt performing toileting and hand hygiene at sink with Supervision-Min Guard A. During mobility to restroom, SpO2 dropping to 86% on RA. Placed on 2L O2 and pt's SpO2 95-89%. HR 103 and RR 30s. Once back in recliner, SpO2 97% on 2L, HR 80s, and RR 20s. Provided pt with handout and education on energy conservation techniques for ADLs; pt verbalized understanding. Continue to recommend dc to home with HHOT and will continue to follow acutely as admitted.    Follow Up Recommendations  Home health OT;Supervision/Assistance - 24 hour    Equipment Recommendations  None recommended by OT    Recommendations for Other Services PT consult    Precautions / Restrictions Precautions Precautions: Fall Precaution Comments: watch O2 Restrictions Weight Bearing Restrictions: No       Mobility Bed Mobility               General bed mobility comments: In recliner upon arrival  Transfers Overall transfer level: Needs assistance Equipment used: None Transfers: Sit to/from [employer] Sit to Stand: Min guard         General transfer comment: Min Guard A for safety    Balance Overall balance assessment: Needs assistance Sitting-balance support: No upper extremity supported Sitting  balance-Leahy Scale: Good     Standing balance support: No upper extremity supported Standing balance-Leahy Scale: Good                             ADL either performed or assessed with clinical judgement   ADL Overall ADL's : Needs assistance/impaired Eating/Feeding: Set up;Sitting   Grooming: Min guard;Standing;Wash/dry hands Grooming Details (indicate cue type and reason): Min Guard A for safety. Cues for purse lip breathing                 Toilet Transfer: Min guard;Ambulation;Regular Toilet;Grab bars Toilet Transfer Details (indicate cue type and reason): Min guard A for safety. No physical A needed. Increased time due to fatigue Toileting- Clothing Manipulation and Hygiene: Supervision/safety;Sitting/lateral lean       Functional mobility during ADLs: Min guard General ADL Comments: Pt continues to present with poor activity tolerance and fatigues quickly. Pt performing functional mobility to bathroom with Min guard A  and performed toileting and then hand hygiene at sink.  Provided pt with education and handout for energy conservation for ADLs and IADLs. Reviewed in full     Vision       Perception     Praxis      Cognition Arousal/Alertness: Awake/alert Behavior During Therapy: WFL for tasks assessed/performed Overall Cognitive Status: Within Functional Limits for tasks assessed  Exercises     Shoulder Instructions       General Comments Rest, SpO2 97% on 2L O2. SpO2 91% at rest on RA. During activity, SpO2 91-86% on RA, HR 103, and RR 33. Placed on 2L O2 and SpO2 95-89%. Once at rest, Spo2 97% on 2L, HR 80-90s, and RR 20s.     Pertinent Vitals/ Pain       Pain Assessment: No/denies pain  Home Living                                          Prior Functioning/Environment              Frequency  Min 2X/week        Progress Toward Goals  OT  Goals(current goals can now be found in the care plan section)  Progress towards OT goals: Progressing toward goals  Acute Rehab OT Goals Patient Stated Goal: return home, return to gardening OT Goal Formulation: With patient Time For Goal Achievement: 07/12/20 Potential to Achieve Goals: Good ADL Goals Pt Will Perform Grooming: with modified independence;standing;sitting Pt Will Perform Lower Body Dressing: with modified independence;sit to/from stand Pt Will Transfer to Toilet: with modified independence;ambulating;bedside commode Pt Will Perform Toileting - Clothing Manipulation and hygiene: with modified independence;sitting/lateral leans;sit to/from stand Additional ADL Goal #1: Pt will independently monitor SpO2 and use purse lip breathing for ADLs Additional ADL Goal #2: Pt will independently verbalize three energy conservation techniques for ADLs and IADLs  Plan Discharge plan remains appropriate    Co-evaluation                 AM-PAC OT "6 Clicks" Daily Activity     Outcome Measure   Help from another person eating meals?: A Little Help from another person taking care of personal grooming?: A Little Help from another person toileting, which includes using toliet, bedpan, or urinal?: A Little Help from another person bathing (including washing, rinsing, drying)?: A Little Help from another person to put on and taking off regular upper body clothing?: A Little Help from another person to put on and taking off regular lower body clothing?: A Little 6 Click Score: 18    End of Session Equipment Utilized During Treatment: Oxygen  OT Visit Diagnosis: Unsteadiness on feet (R26.81);Other abnormalities of gait and mobility (R26.89);Muscle weakness (generalized) (M62.81)   Activity Tolerance Patient tolerated treatment well   Patient Left in chair;with call bell/phone within reach   Nurse Communication Mobility status        Time: 1244-1311 OT Time Calculation  (min): 27 min  Charges: OT General Charges $OT Visit: 1 Visit OT Treatments $Self Care/Home Management : 23-37 mins  Adrin Julian MSOT, OTR/L Acute Rehab Pager: [phone] Office: [phone]   [name] 06/30/2020, 2:15 PM

## 2020-06-30 NOTE — TOC Transition Note (Addendum)
Transition of Care [facility]) - CM/SW Discharge Note   Patient Details  Name: Vickie [name] MRN: [id number] Date of Birth: 01/11/1941  Transition of Care [facility]) CM/SW Contact:  Lockie Pares, RN Phone Number: 06/30/2020, 3:19 PM   Clinical Narrative:    Spoke to patient about home health and oxygen, she stated to call her son. Called son Mr. Randal Buba regarding living arrangements and transport. He will be able to pick her up. She lives in a suite with a kitchen, bedroom and bathroom.  She does have a rather high bed that she needs a step to get up to. Oxygen ordered from [employer]. [facility] will see her tomorrow for home health. He will call RN in charge of patient as he has question about isolation.  I told him that she did need someone with her for observation. He stated that he and his wife both run a business and neither can be away. They will work something out. I called about oxygen and she will have a concentrator with set up at home in one to two days.  I touched base with the team consisting of OT, CSW and RN. I felt that I needed to reclarify with the son about the 24/7 coverage. The patient is very weak. He stated that he could not provide coverage 24/7 we discussed rehab/SNF he feels this is the best option. Will persue placement.  And text him the choices per his request. The patient states he is the one to decide.  Final next level of care: Home w Home Health Services Barriers to Discharge: No Barriers Identified   Patient Goals and CMS Choice Patient states their goals for this hospitalization and ongoing recovery are:: to go home CMS Medicare.gov Compare Post Acute Care list provided to:: Patient Choice offered to / list presented to : Patient  Discharge Placement                       Discharge Plan and Services   Discharge Planning Services: CM Consult Post Acute Care Choice: Home Health, Durable Medical Equipment          DME Arranged: Oxygen DME Agency:  [employer] Date DME Agency Contacted: 06/30/20 Time DME Agency Contacted: 1400 Representative spoke with at DME Agency: Oletha Cruel HH Arranged: PT, OT   Date HH Agency Contacted: 06/30/20 Time HH Agency Contacted: 1206 Representative spoke with at [facility] Agency: Becky Sax  Social Determinants of Health (SDOH) Interventions     Readmission Risk Interventions No flowsheet data found.

## 2020-06-30 NOTE — Progress Notes (Signed)
Physical Therapy Treatment Patient Details Name: Vickie [name] MRN: [id number] DOB: 08-Feb-1941 Today's Date: 06/30/2020    History of Present Illness Pt is 79 yo female with hx of CAD, HF, HTN, HLD, hypothyroidism, and GERD.  Pt admitted with [facility], resp failure, secondary to COVID 19 PNE.  Early on 06/24/20 pt with significant event requiring O2 increased to 15 L HFNC (from 3 LPM).  Her chest CT was negative for PE and she was weaned back to 6 LPM.  Pt had another episode and increased to heated high flow oxygen but is again back down to 4 LPM O2.    PT Comments    Patient scheduled to be discharged home today. Notified by OT and went to see patient to complete education re: managing O2 tubing while walking in her home. She did very well with this activity. She managed her breathing and knew when she needed to sit down and rest. She is eager to go home and I do feel family can check on her intermittently and she will do well (no confusion noted, did not desaturate, no loss of balance).     Follow Up Recommendations  Home health PT;Supervision - Intermittent     Equipment Recommendations  None recommended by PT    Recommendations for Other Services       Precautions / Restrictions Precautions Precautions: Fall Precaution Comments: watch O2    Mobility  Bed Mobility               General bed mobility comments: In recliner upon arrival  Transfers Overall transfer level: Independent Equipment used: None Transfers: Sit to/from [employer] to Stand: Independent         General transfer comment: including managing O2 tubing  Ambulation/Gait Ambulation/Gait assistance: Supervision Gait Distance (Feet): 50 Feet Assistive device: None Gait Pattern/deviations: Decreased stride length;Step-through pattern Gait velocity: decreased   General Gait Details: Educated pt on how to manage O2 tubing (using extensions) to simulate home set-up. Patient  quickly learned to gather tubing in her hand as she walked towards source and "reel out" tubing as she walked away from O2 source. She had no imbalance while navigating around furniture and making 180 turn   Stairs             Wheelchair Mobility    Modified Rankin (Stroke Patients Only)       Balance Overall balance assessment: Needs assistance Sitting-balance support: No upper extremity supported Sitting balance-Leahy Scale: Good     Standing balance support: No upper extremity supported Standing balance-Leahy Scale: Good Standing balance comment: Static standing balance                            Cognition Arousal/Alertness: Awake/alert Behavior During Therapy: WFL for tasks assessed/performed Overall Cognitive Status: Within Functional Limits for tasks assessed                                        Exercises      General Comments General comments (skin integrity, edema, etc.): Pt sitting, dressed to go home. Agreed to education for home safety.       Pertinent Vitals/Pain Pain Assessment: No/denies pain    Home Living                      Prior Function  PT Goals (current goals can now be found in the care plan section) Acute Rehab PT Goals Patient Stated Goal: return home, return to gardening Time For Goal Achievement: 07/06/20 Potential to Achieve Goals: Good Progress towards PT goals: Progressing toward goals    Frequency    Min 3X/week      PT Plan Discharge plan needs to be updated    Co-evaluation              AM-PAC PT "6 Clicks" Mobility   Outcome Measure  Help needed turning from your back to your side while in a flat bed without using bedrails?: A Little Help needed moving from lying on your back to sitting on the side of a flat bed without using bedrails?: A Little Help needed moving to and from a bed to a chair (including a wheelchair)?: None Help needed standing up from a  chair using your arms (e.g., wheelchair or bedside chair)?: None Help needed to walk in hospital room?: None Help needed climbing 3-5 steps with a railing? : A Little 6 Click Score: 21    End of Session Equipment Utilized During Treatment: Oxygen Activity Tolerance: Patient tolerated treatment well Patient left: with call bell/phone within reach;in chair Nurse Communication: Mobility status;Other (comment) (including managing O2 tubing) PT Visit Diagnosis: Other abnormalities of gait and mobility (R26.89);Muscle weakness (generalized) (M62.81)     Time: 3888-2800 PT Time Calculation (min) (ACUTE ONLY): 26 min  Charges:  $Gait Training: 8-22 mins $Self Care/Home Management: 8-22                      [name], PT Pager [phone]    Zena Amos 06/30/2020, 4:53 PM

## 2020-07-02 DIAGNOSIS — M722 Plantar fascial fibromatosis: Secondary | ICD-10-CM | POA: Diagnosis not present

## 2020-07-02 DIAGNOSIS — Z6828 Body mass index (BMI) 28.0-28.9, adult: Secondary | ICD-10-CM | POA: Diagnosis not present

## 2020-07-02 DIAGNOSIS — I2510 Atherosclerotic heart disease of native coronary artery without angina pectoris: Secondary | ICD-10-CM | POA: Diagnosis not present

## 2020-07-02 DIAGNOSIS — L508 Other urticaria: Secondary | ICD-10-CM | POA: Diagnosis not present

## 2020-07-02 DIAGNOSIS — K219 Gastro-esophageal reflux disease without esophagitis: Secondary | ICD-10-CM | POA: Diagnosis not present

## 2020-07-02 DIAGNOSIS — I110 Hypertensive heart disease with heart failure: Secondary | ICD-10-CM | POA: Diagnosis not present

## 2020-07-02 DIAGNOSIS — Z9981 Dependence on supplemental oxygen: Secondary | ICD-10-CM | POA: Diagnosis not present

## 2020-07-02 DIAGNOSIS — H9190 Unspecified hearing loss, unspecified ear: Secondary | ICD-10-CM | POA: Diagnosis not present

## 2020-07-02 DIAGNOSIS — E038 Other specified hypothyroidism: Secondary | ICD-10-CM | POA: Diagnosis not present

## 2020-07-02 DIAGNOSIS — U071 COVID-19: Secondary | ICD-10-CM | POA: Diagnosis not present

## 2020-07-02 DIAGNOSIS — E785 Hyperlipidemia, unspecified: Secondary | ICD-10-CM | POA: Diagnosis not present

## 2020-07-02 DIAGNOSIS — J1282 Pneumonia due to coronavirus disease 2019: Secondary | ICD-10-CM | POA: Diagnosis not present

## 2020-07-02 DIAGNOSIS — E663 Overweight: Secondary | ICD-10-CM | POA: Diagnosis not present

## 2020-07-02 DIAGNOSIS — K5909 Other constipation: Secondary | ICD-10-CM | POA: Diagnosis not present

## 2020-07-02 DIAGNOSIS — I5032 Chronic diastolic (congestive) heart failure: Secondary | ICD-10-CM | POA: Diagnosis not present

## 2020-07-04 ENCOUNTER — Telehealth: Payer: Self-pay | Admitting: Internal Medicine

## 2020-07-04 NOTE — Telephone Encounter (Signed)
-----   Message from Doran Stabler, DO sent at 06/30/2020 11:43 AM EDT ----- Hello,  Please set up a Telehealth hospital visit for this patient. She tested positive for Covid on 06/21/2020.  Thank you  Doran Stabler

## 2020-07-04 NOTE — Telephone Encounter (Signed)
TOC HFU appointment via telehealth 07/11/2020 at 8:45 am with Dr. Huel Cote.  No answer and voicemail not set up.  Appointment card has been mailed.

## 2020-07-05 DIAGNOSIS — U071 COVID-19: Secondary | ICD-10-CM | POA: Diagnosis not present

## 2020-07-05 DIAGNOSIS — I110 Hypertensive heart disease with heart failure: Secondary | ICD-10-CM | POA: Diagnosis not present

## 2020-07-05 DIAGNOSIS — I5032 Chronic diastolic (congestive) heart failure: Secondary | ICD-10-CM | POA: Diagnosis not present

## 2020-07-05 DIAGNOSIS — M722 Plantar fascial fibromatosis: Secondary | ICD-10-CM | POA: Diagnosis not present

## 2020-07-05 DIAGNOSIS — J1282 Pneumonia due to coronavirus disease 2019: Secondary | ICD-10-CM | POA: Diagnosis not present

## 2020-07-05 DIAGNOSIS — I2510 Atherosclerotic heart disease of native coronary artery without angina pectoris: Secondary | ICD-10-CM | POA: Diagnosis not present

## 2020-07-06 ENCOUNTER — Telehealth: Payer: Self-pay | Admitting: *Deleted

## 2020-07-06 NOTE — Telephone Encounter (Signed)
  Chronic Care Management   Outreach Note  07/06/2020 Name: Vickie [name] MRN: [id number] DOB: [birth year]  Referred by: Inez Catalina, MD Reason for referral : Chronic Care Management (HTN, CAD, HLD, s/p covid pna 06/21/20)   An unsuccessful telephone outreach was attempted today. Received referral from [facility] Liasion Charlesetta Shanks on 07/05/20 to discuss chronic care management program. Patient was hospitalized at [facility] 10/26-11/4 with pneumonia related to Covid 19 infection. She was discharged home with home O2 provided by [facility] and home health services to be provided by [facility]. The patient was referred to the case management team for assistance with care management and care coordination.  Unable to leave message as recording on patient's contact number states voice mailbox has not been set up yet.  Follow Up Plan: The care management team will reach out to the patient again over the next 7-10 days.   Cranford Mon RN, CCM, CDCES CCM Clinic RN Care Manager [phone]

## 2020-07-06 NOTE — Telephone Encounter (Signed)
Phone# goes straight to message that vmail hyas not been set up yet. Will try again 11/11

## 2020-07-07 DIAGNOSIS — U071 COVID-19: Secondary | ICD-10-CM | POA: Diagnosis not present

## 2020-07-07 DIAGNOSIS — I110 Hypertensive heart disease with heart failure: Secondary | ICD-10-CM | POA: Diagnosis not present

## 2020-07-07 DIAGNOSIS — I2510 Atherosclerotic heart disease of native coronary artery without angina pectoris: Secondary | ICD-10-CM | POA: Diagnosis not present

## 2020-07-07 DIAGNOSIS — M722 Plantar fascial fibromatosis: Secondary | ICD-10-CM | POA: Diagnosis not present

## 2020-07-07 DIAGNOSIS — J1282 Pneumonia due to coronavirus disease 2019: Secondary | ICD-10-CM | POA: Diagnosis not present

## 2020-07-07 DIAGNOSIS — I5032 Chronic diastolic (congestive) heart failure: Secondary | ICD-10-CM | POA: Diagnosis not present

## 2020-07-07 NOTE — Progress Notes (Signed)
  [facility] Internal Medicine Residency Telephone Encounter Continuity Care Appointment  HPI:   This telephone encounter was created for Vickie [name] on 07/11/2020 for the following purpose/cc hospital follow up.  VickieAdriane Judie Petit [name] is a 79 y.o. with the history listed below including CAD, hypertension, hypothyroidism, GERD, and hyperlipidemia who was recently admitted from 10/26-11/4 for COVID-19 pneumonia.  Initially admitted for worsening shortness of breath and generalized weakness requiring supplemental oxygen.  Treated with remdesivir and Decadron.  During admission patient oxygen requirement increased to 15 L nonrebreather at 90 L high needed high flow obtain a CTA that was negative for PE.  Started on baricitinib.  Weaned down to 2 L nasal cannula on discharge on 2 L supplemental oxygen.    Since then she reports that she feeling is better, she reports that she has spent 10 days in quarantine with her oldest son. Came home yesterday afternoon and has been busy around the house getting things in order. She reports that she is still on oxygen 2 L daily, oxygen saturation maintaining in the 90s. She has physical therapist coming out tomorrow. She denies chest pain, fevers, chills, nausea, diarrhea or constipation.  She initially had a small amount of loose stools however this is resolved.   She has been eating a drinking okay, appetite has returned. She is looking forward to the spring and gardening.  She denies any other complaints or concerns.   Past Medical History:  Past Medical History:  Diagnosis Date  . CAD (coronary artery disease)   . Chronic diastolic heart failure (HCC) 01/20/2016   Grade I by Echo 01/19/2016  . Chronic urticaria 03/16/2013  . Essential hypertension 08/14/2006  . Gallstones   . Gastroesophageal reflux disease 07/03/2013   Symptoms are not significant enough that patient wants pharmacologic therapy at this point   . Hyperlipidemia LDL goal < 130  07/03/2013  . Hypothyroidism 08/14/2006  . Overweight (BMI 25.0-29.9) 07/03/2013      ROS:  Constitutional: Negative for chills and fever.  Respiratory: Negative for shortness of breath.   Cardiovascular: Negative for chest pain and leg swelling.  Gastrointestinal: Negative for abdominal pain, nausea and vomiting.   Neurological: Negative for dizziness and headaches.    Assessment / Plan / Recommendations:   Please see A&P under problem oriented charting for assessment of the patient's acute and chronic medical conditions.   As always, pt is advised that if symptoms worsen or new symptoms arise, they should go to an urgent care facility or to to ER for further evaluation.   Consent and Medical Decision Making:   Patient discussed with Dr. Sandre Kitty  This is a telephone encounter between Vickie [name] and Claudean Severance on 07/11/2020 for hospital follow up for COVID-19 hospitalization. The visit was conducted with the patient located at home and Claudean Severance at [facility]. The patient's identity was confirmed using their DOB and current address. The patient has consented to being evaluated through a telephone encounter and understands the associated risks (an examination cannot be done and the patient may need to come in for an appointment) / benefits (allows the patient to remain at home, decreasing exposure to coronavirus). I personally spent 9 minutes on medical discussion.

## 2020-07-08 NOTE — Telephone Encounter (Signed)
Straight to vmail again when called

## 2020-07-11 ENCOUNTER — Other Ambulatory Visit: Payer: Self-pay

## 2020-07-11 ENCOUNTER — Ambulatory Visit (INDEPENDENT_AMBULATORY_CARE_PROVIDER_SITE_OTHER): Payer: Medicare Other | Admitting: Internal Medicine

## 2020-07-11 ENCOUNTER — Telehealth: Payer: Self-pay | Admitting: *Deleted

## 2020-07-11 DIAGNOSIS — I10 Essential (primary) hypertension: Secondary | ICD-10-CM

## 2020-07-11 DIAGNOSIS — J1282 Pneumonia due to coronavirus disease 2019: Secondary | ICD-10-CM | POA: Diagnosis not present

## 2020-07-11 DIAGNOSIS — U071 COVID-19: Secondary | ICD-10-CM | POA: Diagnosis not present

## 2020-07-11 NOTE — Telephone Encounter (Signed)
Pt states she is very worried, states decreased urine, she states she doesn't know if she is drinking enough, denies burning, urgency. Doesn't know the color or amount just states" its not enough when I go" she is encouraged to drink as much as she can, to note how much and look at her urine when she does go. Denies feeling bloated or full. Seems very anxious when talking to her. States she forgot to tell the md when she spoke to her this am. Please advise or call pt

## 2020-07-11 NOTE — Assessment & Plan Note (Signed)
[name] is a 79 y.o. with the history listed below including CAD, hypertension, hypothyroidism, GERD, and hyperlipidemia who was recently admitted from 10/26-11/4 for COVID-19 pneumonia.  Initially admitted for worsening shortness of breath and generalized weakness requiring supplemental oxygen.  Treated with remdesivir and Decadron.  During admission patient oxygen requirement increased to 15 L nonrebreather at 90 L high needed high flow obtain a CTA that was negative for PE.  Started on baricitinib.  Weaned down to 2 L nasal cannula on discharge on 2 L supplemental oxygen.   Since then she reports that she feeling is better, she reports that she has spent 10 days in quarantine with her oldest son. Came home yesterday afternoon and has been busy around the house getting things in order. She reports that she is still on oxygen 2 L daily, oxygen saturation maintaining in the 90s. She has physical therapist coming out tomorrow. She denies chest pain, fevers, chills, nausea, diarrhea or constipation.  She initially had a small amount of loose stools however this is resolved.   She has been eating a drinking okay, appetite has returned. She is looking forward to the spring and gardening.  She denies any other complaints or concerns.  Overall patient appears to be doing well per her report, able to speak in full sentences with no shortness of breath or coughing noted.  Patient continues to be on 2 L nasal cannula.  We will have her follow-up in 1 month in person to assess further need for oxygen.

## 2020-07-11 NOTE — Telephone Encounter (Signed)
Contacted patient regarding her concerns. She reports that she is already urinating more after increasing her fluid intake. She states that she just noticed some decreased urination this morning and wanted to make sure we knew. She denies any fevers, chills, nausea, vomiting, dysuria, or hematuria. She reported that she will continue increasing her fluid intake. Advised her that if she has difficulty with urination in the future to let us know, we may get labs at that time. Given the improvement with increased fluid intake I do not think labs are required at this time, BMP on discharge showed stable kidney function.

## 2020-07-12 DIAGNOSIS — U071 COVID-19: Secondary | ICD-10-CM | POA: Diagnosis not present

## 2020-07-12 DIAGNOSIS — J1282 Pneumonia due to coronavirus disease 2019: Secondary | ICD-10-CM | POA: Diagnosis not present

## 2020-07-12 DIAGNOSIS — I5032 Chronic diastolic (congestive) heart failure: Secondary | ICD-10-CM | POA: Diagnosis not present

## 2020-07-12 DIAGNOSIS — M722 Plantar fascial fibromatosis: Secondary | ICD-10-CM | POA: Diagnosis not present

## 2020-07-12 DIAGNOSIS — I2510 Atherosclerotic heart disease of native coronary artery without angina pectoris: Secondary | ICD-10-CM | POA: Diagnosis not present

## 2020-07-12 DIAGNOSIS — I110 Hypertensive heart disease with heart failure: Secondary | ICD-10-CM | POA: Diagnosis not present

## 2020-07-13 ENCOUNTER — Ambulatory Visit: Payer: Medicare Other | Admitting: *Deleted

## 2020-07-13 DIAGNOSIS — J1282 Pneumonia due to coronavirus disease 2019: Secondary | ICD-10-CM

## 2020-07-13 DIAGNOSIS — I10 Essential (primary) hypertension: Secondary | ICD-10-CM

## 2020-07-13 DIAGNOSIS — I25118 Atherosclerotic heart disease of native coronary artery with other forms of angina pectoris: Secondary | ICD-10-CM

## 2020-07-13 DIAGNOSIS — E785 Hyperlipidemia, unspecified: Secondary | ICD-10-CM

## 2020-07-13 NOTE — Chronic Care Management (AMB) (Signed)
Chronic Care Management   Initial Visit Note  07/13/2020 Name: Vickie [name] MRN: [id number] DOB: 24-Feb-1941  Referred by: Vickie Falcon, MD Reason for referral : Chronic Care Management (HTN, CAD, HLD, s/p covid pna 06/21/20)   Vickie [name] is a 79 y.o. year old female who is a primary care patient of Vickie Falcon, MD. The CCM team was consulted for assistance with chronic disease management and care coordination needs related to CAD, HTN, HLD and s/p Covid pneumonia.  Review of patient status, including review of consultants reports, relevant laboratory and other test results, and collaboration with appropriate care team members and the patient's provider was performed as part of comprehensive patient evaluation and provision of chronic care management services.    SDOH (Social Determinants of Health) assessments performed: Yes See Care Plan activities for detailed interventions related to SDOH  SDOH Interventions     Most Recent Value  SDOH Interventions  Food Insecurity Interventions Intervention Not Indicated  Financial Strain Interventions Intervention Not Indicated  Housing Interventions Intervention Not Indicated  Social Connections Interventions Intervention Not Indicated  Transportation Interventions Other (Comment)  [referred to CCM BSW for transportation resources]       Medications: Outpatient Encounter Medications as of 07/13/2020  Medication Sig Note  . Alirocumab (PRALUENT) 75 MG/ML SOAJ Inject 75 mg into the skin every 14 (fourteen) days.   Marland Kitchen aspirin 81 MG tablet Take 1 tablet (81 mg total) by mouth daily.   Marland Kitchen ibuprofen (ADVIL) 200 MG tablet Take 200-400 mg by mouth every 6 (six) hours as needed for mild pain (or discomfort).   . isosorbide mononitrate (IMDUR) 60 MG 24 hr tablet Take 1 tablet (60 mg total) by mouth daily. 06/22/2020: LF #90 on 05/03/2020, per [facility]  . levothyroxine (SYNTHROID) 88 MCG tablet Take 1 tablet (88 mcg total) by mouth daily  before breakfast. 06/22/2020: LF #90 on 06/16/2020, per [facility]  . lisinopril (ZESTRIL) 10 MG tablet Take 1 tablet (10 mg total) by mouth daily. 06/22/2020: LF #90 on 05/25/2020, [facility]  . nebivolol (BYSTOLIC) 10 MG tablet Take 1 tablet (10 mg total) by mouth daily. KE 06/22/2020: LF #90 on 06/06/2020, [facility]  . nitroGLYCERIN (NITROSTAT) 0.4 MG SL tablet Place 1 tablet (0.4 mg total) under the tongue every 5 (five) minutes as needed for chest pain. 06/22/2020: LF #25 in 03/2020, per [facility]   No facility-administered encounter medications on file as of 07/13/2020.     Objective:  BP Readings from Last 3 Encounters:  06/30/20 128/75  06/15/20 140/72  06/10/20 (!) 157/78   Wt Readings from Last 3 Encounters:  06/22/20 147 lb 11.3 oz (67 kg)  06/15/20 146 lb 6.4 oz (66.4 kg)  06/10/20 146 lb 8 oz (66.5 kg)   Lab Results  Component Value Date   CHOL 91 (L) 08/13/2019   HDL 41 08/13/2019   LDLCALC 29 08/13/2019   TRIG 118 08/13/2019   CHOLHDL 2.2 08/13/2019    Goals Addressed              This Visit's Progress     Patient Stated   .  "I need help with getting to my doctors' appointments." (pt-stated)        Current Barriers:  . Chronic Disease Management support, education, and care coordination needs related to CAD, HTN, HLD, and s/p Covid pneumonia. . Does not attend all scheduled provider appointments due to transportation barriers.- Spoke with patient to discuss chronic care management (CCM) program  and explained that referral to CCM was made by Vickie [name] after patient's hospitalization from 10/26-11/4 for Covid Pneumonia. Patient reports she is recovering well although still using home and portable O2 at 2l /min. Says she is monitoring her oxygen saturation and they remain in the low (with activity) to high 90s (at rest). She says she is progressing well with home health physical therapy provided by [facility]. Patient  states she lives in room over son's garage  and she is independent with ADL's but gave up her care about a year ago due to the expense. She says her family helps her with errand transportation but it is difficult at times for them to get her to her providers' appointments because they occur during work hours. She denies need for CCM RN assistance with chronic disease management and this CCM RN agrees that she is meeting treatment targets for HTN, HLD and CAD however patient requests assistance from [facility] BSW for transportation resources.   Case Manager Clinical Goal(s):  Marland Kitchen Over the next 30 days, patient will work with BSW to address needs related to Transportation in patient with CAD, HTN, HLD, and s/p Covid pneumonia. Interventions:  . Collaborated with BSW to initiate plan of care to address needs related to Transportation in patient with CAD, HTN, HLD, and s/p Covid pneumonia. Patient Goals/Self-Care Activities . Over the next 30 days, patient will:   -  receive transportation resources from [place]: The care management team will reach out to the patient again over the next 30 days.    Initial Goal Documentation        Vickie [name] was given information about Chronic Care Management services today including:  1. CCM service includes personalized support from designated clinical staff supervised by her physician, including individualized plan of care and coordination with other care providers 2. 24/7 contact phone numbers for assistance for urgent and routine care needs. 3. Service will only be billed when office clinical staff spend 20 minutes or more in a month to coordinate care. 4. Only one practitioner may furnish and bill the service in a calendar month. 5. The patient may stop CCM services at any time (effective at the end of the month) by phone call to the office staff. 6. The patient will be responsible for cost sharing (co-pay) of up to 20% of the service fee (after  annual deductible is met).  Patient wishes to consider information provided and/or speak with a member of the care team before deciding about enrollment in care management services.   Plan:   The CCM BSW will reach out to the patient again over the next 30 days as no needs identified related to assistance with cornice disease management from CCM RN.   Kelli Churn RN, CCM, [facility] RN Care Manager [phone]

## 2020-07-13 NOTE — Patient Instructions (Signed)
Visit Information It was nice speaking with you today. Goals Addressed              This Visit's Progress     Patient Stated   .  "I need help with getting to my doctors' appointments." (pt-stated)        Current Barriers:  . Chronic Disease Management support, education, and care coordination needs related to CAD, HTN, HLD, and s/p Covid pneumonia. . Does not attend all scheduled provider appointments due to transportation barriers.- Spoke with patient to discuss chronic care management (CCM) program and explained that referral to CCM was made by [facility] Liaison [place] after patient's hospitalization from 10/26-11/4 for Covid Pneumonia. Patient reports she is recovering well although still using home and portable O2 at 2l /min. Says she is monitoring her oxygen saturation and they remain in the low (with activity) to high 90s (at rest). She says she is progressing well with home health physical therapy provided by [facility]. Patient states she lives in room over son's garage  and she is independent with ADL's but gave up her care about a year ago due to the expense. She says her family helps her with errand transportation but it is difficult at times for them to get her to her providers' appointments because they occur during work hours. She denies need for CCM RN assistance with chronic disease management and this CCM RN agrees that she is meeting treatment targets for HTN, HLD and CAD however patient requests assistance from [facility] BSW for transportation resources.   Case Manager Clinical Goal(s):  Marland Kitchen Over the next 30 days, patient will work with BSW to address needs related to Transportation in patient with CAD, HTN, HLD, and s/p Covid pneumonia. Interventions:  . Collaborated with BSW to initiate plan of care to address needs related to Transportation in patient with CAD, HTN, HLD, and s/p Covid pneumonia. Patient Goals/Self-Care  Activities . Over the next 30 days, patient will:   -  receive transportation resources from [place]: The care management team will reach out to the patient again over the next 30 days.    Initial Goal Documentation       Ms. Bonafede was given information about Chronic Care Management services today including:  1. CCM service includes personalized support from designated clinical staff supervised by her physician, including individualized plan of care and coordination with other care providers 2. 24/7 contact phone numbers for assistance for urgent and routine care needs. 3. Service will only be billed when office clinical staff spend 20 minutes or more in a month to coordinate care. 4. Only one practitioner may furnish and bill the service in a calendar month. 5. The patient may stop CCM services at any time (effective at the end of the month) by phone call to the office staff. 6. The patient will be responsible for cost sharing (co-pay) of up to 20% of the service fee (after annual deductible is met).  Patient agreed to services of CCM BSW and verbal consent obtained.   The patient verbalized understanding of instructions, educational materials, and care plan provided today and declined offer to receive copy of patient instructions, educational materials, and care plan.   The CCM BSW will reach out to the patient again over the next 30 days.   Kelli Churn RN, CCM, [facility] RN Care Manager [phone]

## 2020-07-14 NOTE — Progress Notes (Signed)
Internal Medicine Clinic Resident  I have personally reviewed this encounter including the documentation in this note and/or discussed this patient with the care management provider. I will address any urgent items identified by the care management provider and will communicate my actions to the patient's PCP. I have reviewed the patient's CCM visit with my supervising attending, Dr Guilloud.  Vasili Marykate Heuberger, MD 07/14/2020   

## 2020-07-15 ENCOUNTER — Telehealth: Payer: Medicare Other

## 2020-07-15 ENCOUNTER — Telehealth: Payer: Self-pay

## 2020-07-15 DIAGNOSIS — J1282 Pneumonia due to coronavirus disease 2019: Secondary | ICD-10-CM | POA: Diagnosis not present

## 2020-07-15 DIAGNOSIS — I110 Hypertensive heart disease with heart failure: Secondary | ICD-10-CM | POA: Diagnosis not present

## 2020-07-15 DIAGNOSIS — M722 Plantar fascial fibromatosis: Secondary | ICD-10-CM | POA: Diagnosis not present

## 2020-07-15 DIAGNOSIS — I2510 Atherosclerotic heart disease of native coronary artery without angina pectoris: Secondary | ICD-10-CM | POA: Diagnosis not present

## 2020-07-15 DIAGNOSIS — I5032 Chronic diastolic (congestive) heart failure: Secondary | ICD-10-CM | POA: Diagnosis not present

## 2020-07-15 DIAGNOSIS — U071 COVID-19: Secondary | ICD-10-CM | POA: Diagnosis not present

## 2020-07-15 NOTE — Telephone Encounter (Signed)
  Care Management   Outreach Note  07/15/2020 Name: LATRONDA SPINK MRN: [id number] DOB: 1941/06/07  Referred by: Inez Catalina, MD Reason for referral : Care Coordination (transportation )   Attempted to contact patient today regarding request for assistance with transportation resources.  No answer and voicemail box was not set up. Will attempt to reach again within 7-10 days.    Malachy Chamber, BSW Embedded Care Coordination Social Worker [facility] [phone]

## 2020-07-20 ENCOUNTER — Telehealth: Payer: Medicare Other

## 2020-07-20 DIAGNOSIS — M722 Plantar fascial fibromatosis: Secondary | ICD-10-CM | POA: Diagnosis not present

## 2020-07-20 DIAGNOSIS — I5032 Chronic diastolic (congestive) heart failure: Secondary | ICD-10-CM | POA: Diagnosis not present

## 2020-07-20 DIAGNOSIS — U071 COVID-19: Secondary | ICD-10-CM | POA: Diagnosis not present

## 2020-07-20 DIAGNOSIS — I2510 Atherosclerotic heart disease of native coronary artery without angina pectoris: Secondary | ICD-10-CM | POA: Diagnosis not present

## 2020-07-20 DIAGNOSIS — J1282 Pneumonia due to coronavirus disease 2019: Secondary | ICD-10-CM | POA: Diagnosis not present

## 2020-07-20 DIAGNOSIS — I110 Hypertensive heart disease with heart failure: Secondary | ICD-10-CM | POA: Diagnosis not present

## 2020-07-25 ENCOUNTER — Ambulatory Visit: Payer: Medicare Other

## 2020-07-25 DIAGNOSIS — M722 Plantar fascial fibromatosis: Secondary | ICD-10-CM | POA: Diagnosis not present

## 2020-07-25 DIAGNOSIS — I110 Hypertensive heart disease with heart failure: Secondary | ICD-10-CM | POA: Diagnosis not present

## 2020-07-25 DIAGNOSIS — I2510 Atherosclerotic heart disease of native coronary artery without angina pectoris: Secondary | ICD-10-CM

## 2020-07-25 DIAGNOSIS — E785 Hyperlipidemia, unspecified: Secondary | ICD-10-CM

## 2020-07-25 DIAGNOSIS — I5032 Chronic diastolic (congestive) heart failure: Secondary | ICD-10-CM | POA: Diagnosis not present

## 2020-07-25 DIAGNOSIS — J1282 Pneumonia due to coronavirus disease 2019: Secondary | ICD-10-CM | POA: Diagnosis not present

## 2020-07-25 DIAGNOSIS — I10 Essential (primary) hypertension: Secondary | ICD-10-CM

## 2020-07-25 DIAGNOSIS — U071 COVID-19: Secondary | ICD-10-CM | POA: Diagnosis not present

## 2020-07-25 NOTE — Progress Notes (Signed)
Internal Medicine Clinic Attending  Case discussed with Dr. Krienke at the time of the visit.  We reviewed the resident's history and exam and pertinent patient test results.  I agree with the assessment, diagnosis, and plan of care documented in the resident's note.  Alexander Raines, M.D., Ph.D.  

## 2020-07-25 NOTE — Patient Instructions (Signed)
Licensed Clinical Social Worker Visit Information  Goals we discussed today:  Goals Addressed              This Visit's Progress   .  "I need help with getting to my doctors' appointments." (pt-stated)        Current Barriers:  . Chronic Disease Management support, education, and care coordination needs related to CAD, HTN, HLD, and s/p Covid pneumonia. . Does not attend all scheduled provider appointments due to transportation barriers.- Spoke with patient to discuss chronic care management (CCM) program and explained that referral to CCM was made by [facility] Liaison [place] after patient's hospitalization from 10/26-11/4 for Covid Pneumonia. Patient reports she is recovering well although still using home and portable O2 at 2l /min. Says she is monitoring her oxygen saturation and they remain in the low (with activity) to high 90s (at rest). She says she is progressing well with home health physical therapy provided by [facility]. Patient states she lives in room over son's garage  and she is independent with ADL's but gave up her care about a year ago due to the expense. She says her family helps her with errand transportation but it is difficult at times for them to get her to her providers' appointments because they occur during work hours. She denies need for CCM RN assistance with chronic disease management and this CCM RN agrees that she is meeting treatment targets for HTN, HLD and CAD however patient requests assistance from [facility] BSW for transportation resources.   Case Manager Clinical Goal(s):  Marland Kitchen Over the next 30 days, patient will work with BSW to address needs related to Transportation in patient with CAD, HTN, HLD, and s/p Covid pneumonia. Interventions:  . Educated patient about [employer] . Educated patient about Medicaid transportation . Provided patient with contact information  for [facility] of Social Services and encouraged her to call to complete phone assessment  Patient Goals/Self-Care Activities . Over the next 30 days, patient will:   -  receive transportation resources from CCM BSW Follow Up Plan: The care management team will reach out to the patient again over the next 30 days.    Please see past updates related to this goal by clicking on the "Past Updates" button in the selected goal          Follow Up Plan: SW will follow up with patient by phone over the next 7-10 days     Starlene Consuegra, BSW Embedded Care Coordination Social Worker [facility] [phone]

## 2020-07-25 NOTE — Chronic Care Management (AMB) (Signed)
Chronic Care Management    Clinical Social Work Follow Up Note  07/25/2020 Name: Vickie [name] MRN: [id number] DOB: May 11, 1941  Vickie [name] is a 79 y.o. year old female who is a primary care patient of Inez Catalina, MD. The CCM team was consulted for assistance with Transportation Needs .   Review of patient status, including review of consultants reports, other relevant assessments, and collaboration with appropriate care team members and the patient's provider was performed as part of comprehensive patient evaluation and provision of chronic care management services.    SDOH (Social Determinants of Health) assessments performed: No    Outpatient Encounter Medications as of 07/25/2020  Medication Sig Note  . Alirocumab (PRALUENT) 75 MG/ML SOAJ Inject 75 mg into the skin every 14 (fourteen) days.   Marland Kitchen aspirin 81 MG tablet Take 1 tablet (81 mg total) by mouth daily.   Marland Kitchen ibuprofen (ADVIL) 200 MG tablet Take 200-400 mg by mouth every 6 (six) hours as needed for mild pain (or discomfort).   . isosorbide mononitrate (IMDUR) 60 MG 24 hr tablet Take 1 tablet (60 mg total) by mouth daily. 06/22/2020: LF #90 on 05/03/2020, per [facility]  . levothyroxine (SYNTHROID) 88 MCG tablet Take 1 tablet (88 mcg total) by mouth daily before breakfast. 06/22/2020: LF #90 on 06/16/2020, [facility]  . lisinopril (ZESTRIL) 10 MG tablet Take 1 tablet (10 mg total) by mouth daily. 06/22/2020: LF #90 on 05/25/2020, [facility]  . nebivolol (BYSTOLIC) 10 MG tablet Take 1 tablet (10 mg total) by mouth daily. KE 06/22/2020: LF #90 on 06/06/2020, [facility]  . nitroGLYCERIN (NITROSTAT) 0.4 MG SL tablet Place 1 tablet (0.4 mg total) under the tongue every 5 (five) minutes as needed for chest pain. 06/22/2020: LF #25 in 03/2020, [facility]   No facility-administered encounter medications on file as of 07/25/2020.     Goals Addressed              This Visit's Progress   .  "I need help with getting to my doctors'  appointments." (pt-stated)        Current Barriers:  . Chronic Disease Management support, education, and care coordination needs related to CAD, HTN, HLD, and s/p Covid pneumonia. . Does not attend all scheduled provider appointments due to transportation barriers.- Spoke with patient to discuss chronic care management (CCM) program and explained that referral to CCM was made by [facility] Liaison [place] after patient's hospitalization from 10/26-11/4 for Covid Pneumonia. Patient reports she is recovering well although still using home and portable O2 at 2l /min. Says she is monitoring her oxygen saturation and they remain in the low (with activity) to high 90s (at rest). She says she is progressing well with home health physical therapy provided by [facility]. Patient states she lives in room over son's garage  and she is independent with ADL's but gave up her care about a year ago due to the expense. She says her family helps her with errand transportation but it is difficult at times for them to get her to her providers' appointments because they occur during work hours. She denies need for CCM RN assistance with chronic disease management and this CCM RN agrees that she is meeting treatment targets for HTN, HLD and CAD however patient requests assistance from [facility] BSW for transportation resources.   Case Manager Clinical Goal(s):  Marland Kitchen Over the next 30 days, patient will work with BSW to address needs  related to Transportation in patient with CAD, HTN, HLD, and s/p Covid pneumonia. Interventions:  . Educated patient about [employer] . Educated patient about Medicaid transportation . Provided patient with contact information for [facility] of Social Services and encouraged her to call to complete phone assessment  Patient Goals/Self-Care Activities . Over the next 30 days, patient will:    -  receive transportation resources from CCM BSW Follow Up Plan: The care management team will reach out to the patient again over the next 30 days.    Please see past updates related to this goal by clicking on the "Past Updates" button in the selected goal         Follow Up Plan: SW will follow up with patient by phone over the next 7-10 days     Devante Capano, BSW Embedded Care Coordination Social Worker [facility] [phone]

## 2020-07-26 NOTE — Progress Notes (Signed)
Internal Medicine Clinic Attending  CCM services provided by the care management provider and their documentation were discussed with Dr. Krienke. We reviewed the pertinent findings, urgent action items addressed by the resident and non-urgent items to be addressed by the PCP.  I agree with the assessment, diagnosis, and plan of care documented in the CCM and resident's note.  Ryder Man, MD 07/26/2020  

## 2020-07-26 NOTE — Progress Notes (Signed)
Internal Medicine Clinic Resident  I have personally reviewed this encounter including the documentation in this note and/or discussed this patient with the care management provider. I will address any urgent items identified by the care management provider and will communicate my actions to the patient's PCP. I have reviewed the patient's CCM visit with my supervising attending, Dr Narendra.  Constantina Laseter M Silvia Markuson, MD 07/26/2020    

## 2020-07-28 ENCOUNTER — Other Ambulatory Visit: Payer: Self-pay

## 2020-07-28 DIAGNOSIS — K2100 Gastro-esophageal reflux disease with esophagitis, without bleeding: Secondary | ICD-10-CM

## 2020-07-28 NOTE — Telephone Encounter (Signed)
Requesting to speak with a nurse about meds, please call pt back.  

## 2020-07-28 NOTE — Telephone Encounter (Signed)
Pt states she has been having gastric reflux, hadnt had it in a long time and its started again. States she knows what it is and knows what she needs. She refuses an appt, states she has one first of year and she is not coming in before then because she does not want COVID again.

## 2020-07-28 NOTE — Telephone Encounter (Signed)
Zantac is pulled off the market.  Can you ask her if she would accept famotidine (Pepcid)?  She can get over the counter as well.

## 2020-07-29 MED ORDER — FAMOTIDINE 20 MG PO TABS: 20.0000 mg | ORAL_TABLET | Freq: Two times a day (BID) | ORAL | 1 refills | Status: DC

## 2020-07-29 MED ORDER — FAMOTIDINE 40 MG PO TABS: 40.0000 mg | ORAL_TABLET | Freq: Two times a day (BID) | ORAL | 1 refills | Status: DC

## 2020-07-29 NOTE — Telephone Encounter (Signed)
Patient called and notified that Zantac has been pulled off the market.  She states she will be willing to try Pepcid and states if she does not get any relief from this, she will call us back. SChaplin, RN,BSN

## 2020-08-01 ENCOUNTER — Ambulatory Visit: Payer: Medicare Other

## 2020-08-01 ENCOUNTER — Other Ambulatory Visit: Payer: Self-pay

## 2020-08-01 ENCOUNTER — Encounter: Payer: Self-pay | Admitting: Student

## 2020-08-01 ENCOUNTER — Telehealth: Payer: Self-pay | Admitting: *Deleted

## 2020-08-01 ENCOUNTER — Ambulatory Visit (INDEPENDENT_AMBULATORY_CARE_PROVIDER_SITE_OTHER): Payer: Medicare Other | Admitting: Student

## 2020-08-01 DIAGNOSIS — K5909 Other constipation: Secondary | ICD-10-CM | POA: Diagnosis not present

## 2020-08-01 DIAGNOSIS — E785 Hyperlipidemia, unspecified: Secondary | ICD-10-CM | POA: Diagnosis not present

## 2020-08-01 DIAGNOSIS — I10 Essential (primary) hypertension: Secondary | ICD-10-CM

## 2020-08-01 DIAGNOSIS — I2510 Atherosclerotic heart disease of native coronary artery without angina pectoris: Secondary | ICD-10-CM

## 2020-08-01 DIAGNOSIS — H9190 Unspecified hearing loss, unspecified ear: Secondary | ICD-10-CM | POA: Diagnosis not present

## 2020-08-01 DIAGNOSIS — N390 Urinary tract infection, site not specified: Secondary | ICD-10-CM | POA: Insufficient documentation

## 2020-08-01 DIAGNOSIS — N3000 Acute cystitis without hematuria: Secondary | ICD-10-CM | POA: Diagnosis not present

## 2020-08-01 DIAGNOSIS — U071 COVID-19: Secondary | ICD-10-CM | POA: Diagnosis not present

## 2020-08-01 DIAGNOSIS — E038 Other specified hypothyroidism: Secondary | ICD-10-CM | POA: Diagnosis not present

## 2020-08-01 DIAGNOSIS — Z9981 Dependence on supplemental oxygen: Secondary | ICD-10-CM | POA: Diagnosis not present

## 2020-08-01 DIAGNOSIS — L508 Other urticaria: Secondary | ICD-10-CM | POA: Diagnosis not present

## 2020-08-01 DIAGNOSIS — E663 Overweight: Secondary | ICD-10-CM | POA: Diagnosis not present

## 2020-08-01 DIAGNOSIS — Z6828 Body mass index (BMI) 28.0-28.9, adult: Secondary | ICD-10-CM | POA: Diagnosis not present

## 2020-08-01 DIAGNOSIS — J1282 Pneumonia due to coronavirus disease 2019: Secondary | ICD-10-CM | POA: Diagnosis not present

## 2020-08-01 DIAGNOSIS — K219 Gastro-esophageal reflux disease without esophagitis: Secondary | ICD-10-CM | POA: Diagnosis not present

## 2020-08-01 DIAGNOSIS — I110 Hypertensive heart disease with heart failure: Secondary | ICD-10-CM | POA: Diagnosis not present

## 2020-08-01 DIAGNOSIS — I5032 Chronic diastolic (congestive) heart failure: Secondary | ICD-10-CM | POA: Diagnosis not present

## 2020-08-01 DIAGNOSIS — M722 Plantar fascial fibromatosis: Secondary | ICD-10-CM | POA: Diagnosis not present

## 2020-08-01 HISTORY — DX: Urinary tract infection, site not specified: N39.0

## 2020-08-01 MED ORDER — NITROFURANTOIN MONOHYD MACRO 100 MG PO CAPS: 100.0000 mg | ORAL_CAPSULE | Freq: Two times a day (BID) | ORAL | 0 refills | Status: AC

## 2020-08-01 NOTE — Chronic Care Management (AMB) (Signed)
  Care Management   Follow Up Note   08/01/2020 Name: Vickie [name] MRN: [id number] DOB: 08/10/1941  Vickie [name] is enrolled in a Managed Medicaid plan: No. Outreach attempt today was successful.    Referred by: Vickie Catalina, MD Reason for referral : Care Coordination (transportation )   Vickie [name] is a 79 y.o. year old female who is a primary care patient of Vickie Catalina, MD. The care management team was consulted for assistance with care management and care coordination needs.    Review of patient status, including review of consultants reports, relevant laboratory and other test results, and collaboration with appropriate care team members and the patient's provider was performed as part of comprehensive patient evaluation and provision of chronic care management services.    Goals Addressed              This Visit's Progress   .  COMPLETED: "I need help with getting to my doctors' appointments." (pt-stated)        Current Barriers:  . Chronic Disease Management support, education, and care coordination needs related to CAD, HTN, HLD, and s/p Covid pneumonia. . Does not attend all scheduled provider appointments due to transportation barriers.- Spoke with patient to discuss chronic care management (CCM) program and explained that referral to CCM was made by [facility] Lia[facility] [place] after patient's hospitalization from 10/26-11/4 for Covid Pneumonia. Patient reports she is recovering well although still using home and portable O2 at 2l /min. Says she is monitoring her oxygen saturation and they remain in the low (with activity) to high 90s (at rest). She says she is progressing well with home health physical therapy provided by [facility]. Patient states she lives in room over son's garage  and she is independent with ADL's but gave up her care about a year ago due to the expense. She says her family helps her with  errand transportation but it is difficult at times for them to get her to her providers' appointments because they occur during work hours. She denies need for CCM RN assistance with chronic disease management and this CCM RN agrees that she is meeting treatment targets for HTN, HLD and CAD however patient requests assistance from [facility] BSW for transportation resources.   Case Manager Clinical Goal(s):  Marland Kitchen Over the next 30 days, patient will work with BSW to address needs related to Transportation in patient with CAD, HTN, HLD, and s/p Covid pneumonia. Interventions:  . Ensured that patient received information/application packet for [employer] . Reminded patient that phone assessment is required to start using Medicaid transportaiton  .   Patient Goals/Self-Care Activities . Over the next 30 days, patient will:   -  receive transportation resources from CCM BSW Follow Up Plan: The care management team will reach out to the patient again over the next 30 days.    Please see past updates related to this goal by clicking on the "Past Updates" button in the selected goal         No further follow up required at this time.     Malachy Chamber, BSW Embedded Care Coordination Social Worker [facility] [phone]                              [facility]

## 2020-08-01 NOTE — Patient Instructions (Signed)
Visit Information  Goals Addressed              This Visit's Progress   .  COMPLETED: "I need help with getting to my doctors' appointments." (pt-stated)        Current Barriers:  . Chronic Disease Management support, education, and care coordination needs related to CAD, HTN, HLD, and s/p Covid pneumonia. . Does not attend all scheduled provider appointments due to transportation barriers.- Spoke with patient to discuss chronic care management (CCM) program and explained that referral to CCM was made by [facility] after patient's hospitalization from 10/26-11/4 for Covid Pneumonia. Patient reports she is recovering well although still using home and portable O2 at 2l /min. Says she is monitoring her oxygen saturation and they remain in the low (with activity) to high 90s (at rest). She says she is progressing well with home health physical therapy provided by [facility]. Patient states she lives in room over son's garage  and she is independent with ADL's but gave up her care about a year ago due to the expense. She says her family helps her with errand transportation but it is difficult at times for them to get her to her providers' appointments because they occur during work hours. She denies need for CCM RN assistance with chronic disease management and this CCM RN agrees that she is meeting treatment targets for HTN, HLD and CAD however patient requests assistance from [facility] BSW for transportation resources.   Case Manager Clinical Goal(s):  Marland Kitchen Over the next 30 days, patient will work with BSW to address needs related to Transportation in patient with CAD, HTN, HLD, and s/p Covid pneumonia. Interventions:  . Ensured that patient received information/application packet for [employer] . Reminded patient that phone assessment is required to start using Medicaid transportaiton  .   Patient Goals/Self-Care Activities . Over  the next 30 days, patient will:   -  receive transportation resources from CCM BSW Follow Up Plan: The care management team will reach out to the patient again over the next 30 days.    Please see past updates related to this goal by clicking on the "Past Updates" button in the selected goal        The patient verbalized understanding of instructions, educational materials, and care plan provided today and declined offer to receive copy of patient instructions, educational materials, and care plan.   No further follow up required at this time.    Malachy Chamber, BSW Embedded Care Coordination Social Worker [facility] [phone]

## 2020-08-01 NOTE — Progress Notes (Signed)
  [facility] Internal Medicine Residency Telephone Encounter Continuity Care Appointment  HPI:   This telephone encounter was created for [name] on 08/01/2020 for the following purpose/cc of dysuria   Past Medical History:  Past Medical History:  Diagnosis Date  . CAD (coronary artery disease)   . Chronic diastolic heart failure (HCC) 01/20/2016   Grade I by Echo 01/19/2016  . Chronic urticaria 03/16/2013  . Essential hypertension 08/14/2006  . Gallstones   . Gastroesophageal reflux disease 07/03/2013   Symptoms are not significant enough that patient wants pharmacologic therapy at this point   . Hyperlipidemia LDL goal < 130 07/03/2013  . Hypothyroidism 08/14/2006  . Overweight (BMI 25.0-29.9) 07/03/2013      ROS:  Review of Systems  Constitutional: Negative for chills and fever.  Respiratory: Negative for shortness of breath.   Cardiovascular: Negative for chest pain.  Genitourinary: Positive for dysuria, frequency and urgency. Negative for flank pain and hematuria.     Assessment / Plan / Recommendations:   Please see A&P under problem oriented charting for assessment of the patient's acute and chronic medical conditions.   As always, pt is advised that if symptoms worsen or new symptoms arise, they should go to an urgent care facility or to to ER for further evaluation.   Consent and Medical Decision Making:   Patient seen with Dr. Heide Spark  This is a telephone encounter between Vickie [name] and Doran Stabler on 08/01/2020 for UTI. The visit was conducted with the patient located at home and Doran Stabler at [facility]. The patient's identity was confirmed using their DOB and current address. The patient has consented to being evaluated through a telephone encounter and understands the associated risks (an examination cannot be done and the patient may need to come in for an appointment) / benefits (allows the patient to remain at home, decreasing exposure to  coronavirus). I personally spent 10 minutes on medical discussion.

## 2020-08-01 NOTE — Progress Notes (Signed)
Internal Medicine Clinic Attending  CCM services provided by the care management provider and their documentation were discussed with Dr. Johnson. We reviewed the pertinent findings, urgent action items addressed by the resident and non-urgent items to be addressed by the PCP.  I agree with the assessment, diagnosis, and plan of care documented in the CCM and resident's note.  Reiley Bertagnolli, MD 08/01/2020  

## 2020-08-01 NOTE — Assessment & Plan Note (Signed)
Patient originally complained of mild right lower quadrant discomfort, decreased urinary output and urgency about 1 week ago.  She called the office and was advised to increase fluid intake.  Her symptom improved in the next few days however came back today.  Patient complaining of burning sensation with urination that comes and go.  Also complains of urgency and frequency.  She denied hematuria, urine discoloration, flank pain, fever, chills, nausea, vomiting, chest pain or shortness of breath.  Patient states that she has never had a UTI.  Assessment and plan: This is likely acute cystitis.  Will treat with 5 days of Macrobid.  Patient is advised to call us back if symptoms not improving in the next 5 days or she started to develop fever, chills, flank pain or hematuria.

## 2020-08-01 NOTE — Telephone Encounter (Signed)
Pt has no way to travel. She had called last week c/o urinary symptoms, increased her po intake and seemed better, she is now having same issues. She does not have HHN only HH PT or we could get HHN to get a sample. She denies fevers. But pain at urethra, burning, stinging. appt in today dr Cyndie Chime. She may do well having HHN come in for 1x week for 4 weeks just to check and run problems by. appt [phone]

## 2020-08-01 NOTE — Progress Notes (Signed)
Internal Medicine Clinic Resident  I have personally reviewed this encounter including the documentation in this note and/or discussed this patient with the care management provider. I will address any urgent items identified by the care management provider and will communicate my actions to the patient's PCP. I have reviewed the patient's CCM visit with my supervising attending, Dr Narendra.  Matt Cassian Torelli, MD 08/01/2020   

## 2020-08-02 NOTE — Telephone Encounter (Signed)
I agree.  Can you give a voice order for [facility] and then send paperwork to me if needed?  Also VO for UA done by nursing and results forwarded to me.  Thanks!

## 2020-08-03 DIAGNOSIS — U071 COVID-19: Secondary | ICD-10-CM | POA: Diagnosis not present

## 2020-08-03 DIAGNOSIS — I2510 Atherosclerotic heart disease of native coronary artery without angina pectoris: Secondary | ICD-10-CM | POA: Diagnosis not present

## 2020-08-03 DIAGNOSIS — I5032 Chronic diastolic (congestive) heart failure: Secondary | ICD-10-CM | POA: Diagnosis not present

## 2020-08-03 DIAGNOSIS — M722 Plantar fascial fibromatosis: Secondary | ICD-10-CM | POA: Diagnosis not present

## 2020-08-03 DIAGNOSIS — J1282 Pneumonia due to coronavirus disease 2019: Secondary | ICD-10-CM | POA: Diagnosis not present

## 2020-08-03 DIAGNOSIS — I110 Hypertensive heart disease with heart failure: Secondary | ICD-10-CM | POA: Diagnosis not present

## 2020-08-05 ENCOUNTER — Encounter: Payer: Self-pay | Admitting: *Deleted

## 2020-08-05 NOTE — Progress Notes (Signed)

## 2020-08-05 NOTE — Progress Notes (Signed)
Internal Medicine Clinic Attending  I evaluated the patient.  I personally confirmed the key portions of the history and exam documented by Dr. Nguyen and I reviewed pertinent patient test results.  The assessment, diagnosis, and plan were formulated together and I agree with the documentation in the resident's note.  

## 2020-08-10 NOTE — Progress Notes (Signed)
Things That May Be Affecting Your Health:  Alcohol  Hearing loss  Pain    Depression  Home Safety  Sexual Health   Diabetes  Lack of physical activity  Stress   Difficulty with daily activities  Loneliness  Tiredness   Drug use  Medicines  Tobacco use   Falls  Motor Vehicle Safety  Weight   Food choices  Oral Health X Other - Heart disease    YOUR PERSONALIZED HEALTH PLAN : 1. Schedule your next subsequent Medicare Wellness visit in one year 2. Attend all of your regular appointments to address your medical issues 3. Complete the preventative screenings and services   Annual Wellness Visit   Medicare Covered Preventative Screenings and Services  Services & Screenings Men and Women Who How Often Need? Date of Last Service Action  Abdominal Aortic Aneurysm Adults with AAA risk factors Once     Alcohol Misuse and Counseling All Adults Screening once a year if no alcohol misuse. Counseling up to 4 face to face sessions.     Bone Density Measurement  Adults at risk for osteoporosis Once every 2 yrs Yes None Recommend schedule  Lipid Panel Z13.6 All adults without CV disease Once every 5 yrs Yes 06/09/18   Colorectal Cancer   Stool sample or  Colonoscopy All adults 50 and older   Once every year  Every 10 years Yes Never Discuss scheduling colonoscopy or FIT testing  Depression All Adults Once a year  Today   Diabetes Screening Blood glucose, post glucose load, or GTT Z13.1  All adults at risk  Pre-diabetics  Once per year  Twice per year Yes 11/4   Diabetes  Self-Management Training All adults Diabetics 10 hrs first year; 2 hours subsequent years. Requires Copay     Glaucoma  Diabetics  Family history of glaucoma  African Americans 50 yrs +  Hispanic Americans 65 yrs + Annually - requires coppay Unknown  Assess if she sees an eye doctor  Hepatitis C Z72.89 or F19.20  High Risk for HCV  Born between 1945 and 1965  Annually  Once Yes  Screen  HIV Z11.4 All  adults based on risk  Annually btw ages 96 & 27 regardless of risk  Annually > 65 yrs if at increased risk No    Lung Cancer Screening Asymptomatic adults aged 45-77 with 30 pack yr history and current smoker OR quit within the last 15 yrs Annually Must have counseling and shared decision making documentation before first screen No    Medical Nutrition Therapy Adults with   Diabetes  Renal disease  Kidney transplant within past 3 yrs 3 hours first year; 2 hours subsequent years     Obesity and Counseling All adults Screening once a year Counseling if BMI 30 or higher  Today   Tobacco Use Counseling Adults who use tobacco  Up to 8 visits in one year     Vaccines Z23  Hepatitis B  Influenza   Pneumonia  Adults   Once  Once every flu season  Two different vaccines separated by one year Yes  Assess interest, she has declined previously.   Next Annual Wellness Visit People with Medicare Every year  Today     Services & Screenings Women Who How Often Need  Date of Last Service Action  Mammogram  Z12.31 Women over 40 One baseline ages 23-39. Annually ager 40 yrs+ Yes  Refused previously  Pap tests All women Annually if high risk. Every 2 yrs  for normal risk women [place]  Has refused in the past  Screening for cervical cancer with   Pap (Z01.419 nl or Z01.411abnl) &  HPV Z11.51 Women aged 62 to 12 Once every 5 yrs     Screening pelvic and breast exams All women Annually if high risk. Every 2 yrs for normal risk women     Sexually Transmitted Diseases  Chlamydia  Gonorrhea  Syphilis All at risk adults Annually for non pregnant females at increased risk         Services & Screenings Men Who How Ofter Need  Date of Last Service Action  Prostate Cancer - DRE & PSA Men over 50 Annually.  DRE might require a copay.     Sexually Transmitted Diseases  Syphilis All at risk adults Annually for men at increased risk

## 2020-08-16 ENCOUNTER — Telehealth: Payer: Self-pay

## 2020-08-16 NOTE — Telephone Encounter (Signed)
Pt don't want home health service [phone]

## 2020-08-18 NOTE — Telephone Encounter (Signed)
We had requested home health nursing to get a urine sample and assessment for urinary symptoms.  If she would agree to one visit for this indication, that would be sufficient.  No need for the other planned visits unless she feels like it is helpful.   Debe Coder, MD

## 2020-08-22 ENCOUNTER — Other Ambulatory Visit: Payer: Self-pay | Admitting: Internal Medicine

## 2020-08-30 DIAGNOSIS — U071 COVID-19: Secondary | ICD-10-CM | POA: Diagnosis not present

## 2020-08-30 DIAGNOSIS — I2510 Atherosclerotic heart disease of native coronary artery without angina pectoris: Secondary | ICD-10-CM | POA: Diagnosis not present

## 2020-08-30 DIAGNOSIS — M722 Plantar fascial fibromatosis: Secondary | ICD-10-CM | POA: Diagnosis not present

## 2020-08-30 DIAGNOSIS — I5032 Chronic diastolic (congestive) heart failure: Secondary | ICD-10-CM | POA: Diagnosis not present

## 2020-08-30 DIAGNOSIS — I110 Hypertensive heart disease with heart failure: Secondary | ICD-10-CM | POA: Diagnosis not present

## 2020-08-30 DIAGNOSIS — J1282 Pneumonia due to coronavirus disease 2019: Secondary | ICD-10-CM | POA: Diagnosis not present

## 2020-09-02 ENCOUNTER — Other Ambulatory Visit: Payer: Self-pay

## 2020-09-02 ENCOUNTER — Ambulatory Visit (INDEPENDENT_AMBULATORY_CARE_PROVIDER_SITE_OTHER): Payer: Medicare Other | Admitting: Internal Medicine

## 2020-09-02 ENCOUNTER — Encounter: Payer: Self-pay | Admitting: Internal Medicine

## 2020-09-02 DIAGNOSIS — I10 Essential (primary) hypertension: Secondary | ICD-10-CM | POA: Diagnosis not present

## 2020-09-02 DIAGNOSIS — K219 Gastro-esophageal reflux disease without esophagitis: Secondary | ICD-10-CM

## 2020-09-02 DIAGNOSIS — E785 Hyperlipidemia, unspecified: Secondary | ICD-10-CM | POA: Diagnosis not present

## 2020-09-02 DIAGNOSIS — E871 Hypo-osmolality and hyponatremia: Secondary | ICD-10-CM

## 2020-09-02 DIAGNOSIS — I2510 Atherosclerotic heart disease of native coronary artery without angina pectoris: Secondary | ICD-10-CM | POA: Diagnosis not present

## 2020-09-02 NOTE — Assessment & Plan Note (Signed)
She takes praluent.  Last LDL was 30 in our system.   Continue follow up with Cardiology.

## 2020-09-02 NOTE — Assessment & Plan Note (Signed)
Mildly low at last check.  Check BMET at next in person visit.

## 2020-09-02 NOTE — Assessment & Plan Note (Signed)
Taking famotidine without issue.  Controlled and chronic.

## 2020-09-02 NOTE — Progress Notes (Signed)
  [facility] Internal Medicine Residency Telephone Encounter Continuity Care Appointment  HPI:   This telephone encounter was created for Ms. [name] on 09/02/2020 for the following purpose/cc follow up of blood pressure and after COVID infection.  Ms. Kinsella reports that she is feeling great.  She has been out in her garden during the nice days last week and is hating the cold weather.  She is no longer requiring oxygen and she has also been released from physical therapy.  She notes that her oxygen saturation is averaging around 94% and she does not have SOB or chest pain.      She is working on transportation to be able to come to appointments.  She notes that she lives in [place] and her family works or goes to school.    Urinary symptoms are improved. She is trying to stay hydrated.  No chest pain. She gets emotionally upset and she will have chest pain.  She has no pain with activity.  She has not had to take her nitroglycerin  For her Blood pressure, she took it this morning and it was 135/60 something.  She notes that her physical therapist was taking it and it was good and the top number was usually lower than 130.     Past Medical History:  Past Medical History:  Diagnosis Date  . CAD (coronary artery disease)   . Chronic diastolic heart failure (HCC) 01/20/2016   Grade I by Echo 01/19/2016  . Chronic urticaria 03/16/2013  . Essential hypertension 08/14/2006  . Gallstones   . Gastroesophageal reflux disease 07/03/2013   Symptoms are not significant enough that patient wants pharmacologic therapy at this point   . Hyperlipidemia LDL goal < 130 07/03/2013  . Hypothyroidism 08/14/2006  . Overweight (BMI 25.0-29.9) 07/03/2013      ROS:   No chest pain, SOB or other symptoms at this time.  Feels great.    Assessment / Plan / Recommendations:   Please see A&P under problem oriented charting for assessment of the patient's acute and chronic medical conditions.   As  always, pt is advised that if symptoms worsen or new symptoms arise, they should go to an urgent care facility or to to ER for further evaluation.   Consent and Medical Decision Making:   This is a telephone encounter between LISAANN ATHA and Debe Coder on 09/02/2020 for follow up of blood pressure and post COVID pneumonia. The visit was conducted with the patient located at home and Debe Coder at [facility]. The patient's identity was confirmed using their DOB and current address. The patient has consented to being evaluated through a telephone encounter and understands the associated risks (an examination cannot be done and the patient may need to come in for an appointment) / benefits (allows the patient to remain at home, decreasing exposure to coronavirus). I personally spent 10 minutes on medical discussion.    Return to clinic as planned in 6-12 months, sooner if needed.

## 2020-09-02 NOTE — Assessment & Plan Note (Signed)
She has not had any chest pain.  She is taking ASA, imdur.  She has not needed her rescue nitroglycerin.  She is on praluent and last LDL in our system was 30 in 2019.  She is due to see her cardiologist  Continue current therapy and follow up with Cardiology.

## 2020-09-02 NOTE — Assessment & Plan Note (Signed)
Blood pressure is reported to be well controlled at home.  She has had symptoms of not feeling well when her BP medications were increased.   Continue lisinopril and nebivolol Follow up at next visit.  Follow up with Cardiology.

## 2020-10-04 ENCOUNTER — Other Ambulatory Visit: Payer: Self-pay

## 2020-10-04 ENCOUNTER — Ambulatory Visit (INDEPENDENT_AMBULATORY_CARE_PROVIDER_SITE_OTHER): Payer: Medicare Other | Admitting: Internal Medicine

## 2020-10-04 ENCOUNTER — Encounter: Payer: Self-pay | Admitting: Internal Medicine

## 2020-10-04 DIAGNOSIS — Z0000 Encounter for general adult medical examination without abnormal findings: Secondary | ICD-10-CM | POA: Diagnosis not present

## 2020-10-04 NOTE — Progress Notes (Signed)
I discussed the AWV findings with the RN who conducted the visit. I was present in the office suite and immediately available to provide assistance and direction throughout the time the service was provided.   

## 2020-10-04 NOTE — Progress Notes (Signed)
Internal Medicine Clinic Attending  Case discussed with Dr. Masoudi at the time of the visit.  We reviewed the AWV findings.  I agree with the assessment, diagnosis, and plan of care documented in the AWV note.     

## 2020-10-04 NOTE — Progress Notes (Signed)
This AWV is being conducted by TELEHEALTH - AUDIO only. The patient was located at home and I was located in [facility]. The patient's identity was confirmed using their DOB and current address. The patient or his/her legal guardian has consented to being evaluated through a telephone encounter and understands the associated risks (an examination cannot be done and the patient may need to come in for an appointment) / benefits (allows the patient to remain at home, decreasing exposure to coronavirus). I personally spent 60 minutes conducting the AWV.  Subjective:   Vickie [name] is a 80 y.o. female who presents for a Medicare Annual Wellness Visit.  The following items have been reviewed and updated today in the appropriate area in the EMR.   Health Risk Assessment  Height, weight, BMI, and BP Visual acuity if needed Depression screen Fall risk / safety level Advance directive discussion Medical and family history were reviewed and updated Updating list of other providers & suppliers Medication reconciliation, including over the counter medicines Cognitive screen Written screening schedule Risk Factor list Personalized health advice, risky behaviors, and treatment advice  Social History   Social History Narrative   Current Social History 10/04/2020        Patient lives with family (younger son, 2 granddaughters, and grandson) in a suite over the garage which is 2 story. There are steps up to the entrance the patient uses the steps are a lift chair.       Patient's method of transportation is via family member.      The highest level of education was some college.      The patient currently retired.      Identified important Relationships are Sons, family       Pets : a dog she takes on walks       Interests / Fun: gardening       Current Stressors: transportation issues       Religious / Personal Beliefs: Christian       Other:           Objective:    Vitals: LMP  10/20/1991  Vitals are unable to obtained due to COVID-19 public health emergency  Activities of Daily Living In your present state of health, do you have any difficulty performing the following activities: 10/04/2020 09/02/2020  Hearing? Y N  Vision? Y N  Comment Has seen opthalmologist a [facility], will make appt after winter -  Difficulty concentrating or making decisions? N N  Walking or climbing stairs? N N  Dressing or bathing? N N  Doing errands, shopping? Y N  Comment Older son assist; CMN has sent transportation information -  Some recent data might be hidden    Goals Goals    . Blood Pressure < 130/80    . Increase physical activity     Begin seated and standing exercises with exercise band to increase strength and balance.  Work in garden several times a week.    Marland Kitchen LDL CALC < 130    . Weight (lb) < 130 lb (59 kg)       Fall Risk Fall Risk  10/04/2020 09/02/2020 06/10/2020 04/22/2019 07/14/2018  Falls in the past year? 1 1 0 0 0  Comment - - - - Emmi Telephone Survey: data to providers prior to load  Number falls in past yr: 0 0 - - -  Comment - - - - -  Injury with Fall? 0 0 - - -  Risk  for fall due to : Other (Comment) No Fall Risks No Fall Risks - -  Risk for fall due to: Comment illness - Covid - - - -  Follow up Falls evaluation completed Falls prevention discussed - Falls evaluation completed -    Depression Screen PHQ 2/9 Scores 10/04/2020 09/02/2020 06/10/2020 04/22/2019  PHQ - 2 Score 0 0 1 0  PHQ- 9 Score 0 - 3 1     Cognitive Testing Six-Item Cognitive Screener   "I would like to ask you some questions that ask you to use your memory. I am going to name three objects. Please wait until I say all three words, then repeat them. Remember what they are  because I am going to ask you to name them again in a few minutes. Please repeat these words for me: APPLE--TABLE--PENNY." (Interviewer may repeat names 3 times if necessary but repetition not scored.)  Did patient  correctly repeat all three words? Yes - may proceed with screen  What year is this? Correct What month is this? Correct What day of the week is this? Correct  What were the three objects I asked you to remember? . Apple Correct . Table Correct . Penny Correct  Score one point for each incorrect answer.  A score of 2 or more points warrants additional investigation.  Patient's score 0   CDC Handout on Fall Prevention and Handout on Home Exercise Program, Access codes [id number] and [id number] given/mailed to patient with exercise band.      Assessment and Plan:    During the course of the visit the patient was educated and counseled about appropriate screening and preventive services as documented in the assessment and plan.  The patient desires to increase activity level by working in her garden several times a week to daily gardening, dependent on the weather.  She will also begin seated and standing exercises with exercise band to increase strength and balance.  The printed AVS was given to the patient and included an updated screening schedule, a list of risk factors, and personalized health advice.        Skip Estimable, RN  10/04/2020

## 2020-10-04 NOTE — Patient Instructions (Addendum)
Things That May Be Affecting Your Health:  Alcohol  Hearing loss  Pain    Depression  Home Safety  Sexual Health   Diabetes  Lack of physical activity  Stress   Difficulty with daily activities  Loneliness  Tiredness   Drug use  Medicines  Tobacco use   Falls  Motor Vehicle Safety  Weight   Food choices  Oral Health X Other - Heart disease    YOUR PERSONALIZED HEALTH PLAN : 1. Schedule your next subsequent Medicare Wellness visit in one year 2. Attend all of your regular appointments to address your medical issues 3. Complete the preventative screenings and services 4.  Please make a follow up visit with your opthalmologist at [facility] in the spring.  If you decide you would like to see another ophthalmologist, please call our office for a referral. 5.  Begin seated and standing exercises with exercise band to increase strength and balance.   Annual Wellness Visit                       Medicare Covered Preventative Screenings and Services  Services & Screenings Men and Women Who How Often Need? Date of Last Service Action  Abdominal Aortic Aneurysm Adults with AAA risk factors Once     Alcohol Misuse and Counseling All Adults Screening once a year if no alcohol misuse. Counseling up to 4 face to face sessions.     Bone Density Measurement  Adults at risk for osteoporosis Once every 2 yrs Yes None Recommend schedule  Lipid Panel Z13.6 All adults without CV disease Once every 5 yrs Yes 06/09/18   Colorectal Cancer   Stool sample or  Colonoscopy All adults 50 and older   Once every year  Every 10 years Yes Never Discuss scheduling colonoscopy or FIT testing  Depression All Adults Once a year  Today   Diabetes Screening Blood glucose, post glucose load, or GTT Z13.1  All adults at risk  Pre-diabetics  Once per year  Twice per year Yes 11/4   Diabetes  Self-Management Training All adults Diabetics 10 hrs first year; 2 hours subsequent  years. Requires Copay     Glaucoma  Diabetics  Family history of glaucoma  African Americans 50 yrs +  Hispanic Americans 65 yrs + Annually - requires coppay Unknown  Assess if she sees an eye doctor  Hepatitis C Z72.89 or F19.20  High Risk for HCV  Born between 1945 and 1965  Annually  Once Yes  Screen  HIV Z11.4 All adults based on risk  Annually btw ages 64 & 32 regardless of risk  Annually > 65 yrs if at increased risk No    Lung Cancer Screening Asymptomatic adults aged 43-77 with 30 pack yr history and current smoker OR quit within the last 15 yrs Annually Must have counseling and shared decision making documentation before first screen No    Medical Nutrition Therapy Adults with   Diabetes  Renal disease  Kidney transplant within past 3 yrs 3 hours first year; 2 hours subsequent years     Obesity and Counseling All adults Screening once a year Counseling if BMI 30 or higher  Today   Tobacco Use Counseling Adults who use tobacco  Up to 8 visits in one year     Vaccines Z23  Hepatitis B  Influenza   Pneumonia  Adults   Once  Once every flu season  Two different vaccines separated by one year Yes  Assess interest, she has declined previously.   Next Annual Wellness Visit People with Medicare Every year  Today     Services & Screenings Women Who How Often Need  Date of Last Service Action  Mammogram  Z12.31 Women over 40 One baseline ages 70-39. Annually ager 40 yrs+ Yes  Refused previously  Pap tests All women Annually if high risk. Every 2 yrs for normal risk women [place]  Has refused in the past  Screening for cervical cancer with   Pap (Z01.419 nl or Z01.411abnl) &  HPV Z11.51 Women aged 59 to 17 Once every 5 yrs     Screening pelvic and breast exams All women Annually if high risk. Every 2 yrs for normal risk women     Sexually Transmitted Diseases  Chlamydia  Gonorrhea  Syphilis All at risk adults Annually  for non pregnant females at increased risk         Services & Screenings Men Who How Ofter Need  Date of Last Service Action  Prostate Cancer - DRE & PSA Men over 50 Annually.  DRE might require a copay.     Sexually Transmitted Diseases  Syphilis All at risk adults Annually for men at increased risk         Fall Prevention in the Home, Adult Falls can cause injuries and can happen to people of all ages. There are many things you can do to make your home safe and to help prevent falls. Ask for help when making these changes. What actions can I take to prevent falls? General Instructions  Use good lighting in all rooms. Replace any light bulbs that burn out.  Turn on the lights in dark areas. Use night-lights.  Keep items that you use often in easy-to-reach places. Lower the shelves around your home if needed.  Set up your furniture so you have a clear path. Avoid moving your furniture around.  Do not have throw rugs or other things on the floor that can make you trip.  Avoid walking on wet floors.  If any of your floors are uneven, fix them.  Add color or contrast paint or tape to clearly mark and help you see: ? Grab bars or handrails. ? First and last steps of staircases. ? Where the edge of each step is.  If you use a stepladder: ? Make sure that it is fully opened. Do not climb a closed stepladder. ? Make sure the sides of the stepladder are locked in place. ? Ask someone to hold the stepladder while you use it.  Know where your pets are when moving through your home. What can I do in the bathroom?  Keep the floor dry. Clean up any water on the floor right away.  Remove soap buildup in the tub or shower.  Use nonskid mats or decals on the floor of the tub or shower.  Attach bath mats securely with double-sided, nonslip rug tape.  If you need to sit down in the shower, use a plastic, nonslip stool.  Install grab bars by the toilet and in  the tub and shower. Do not use towel bars as grab bars.      What can I do in the bedroom?  Make sure that you have a light by your bed that is easy to reach.  Do not use any sheets or blankets for your bed that hang to the floor.  Have a firm chair with side arms that you can use for support  when you get dressed. What can I do in the kitchen?  Clean up any spills right away.  If you need to reach something above you, use a step stool with a grab bar.  Keep electrical cords out of the way.  Do not use floor polish or wax that makes floors slippery. What can I do with my stairs?  Do not leave any items on the stairs.  Make sure that you have a light switch at the top and the bottom of the stairs.  Make sure that there are handrails on both sides of the stairs. Fix handrails that are broken or loose.  Install nonslip stair treads on all your stairs.  Avoid having throw rugs at the top or bottom of the stairs.  Choose a carpet that does not hide the edge of the steps on the stairs.  Check carpeting to make sure that it is firmly attached to the stairs. Fix carpet that is loose or worn. What can I do on the outside of my home?  Use bright outdoor lighting.  Fix the edges of walkways and driveways and fix any cracks.  Remove anything that might make you trip as you walk through a door, such as a raised step or threshold.  Trim any bushes or trees on paths to your home.  Check to see if handrails are loose or broken and that both sides of all steps have handrails.  Install guardrails along the edges of any raised decks and porches.  Clear paths of anything that can make you trip, such as tools or rocks.  Have leaves, snow, or ice cleared regularly.  Use sand or salt on paths during winter.  Clean up any spills in your garage right away. This includes grease or oil spills. What other actions can I take?  Wear shoes that: ? Have a low heel. Do not wear high  heels. ? Have rubber bottoms. ? Feel good on your feet and fit well. ? Are closed at the toe. Do not wear open-toe sandals.  Use tools that help you move around if needed. These include: ? Canes. ? Walkers. ? Scooters. ? Crutches.  Review your medicines with your doctor. Some medicines can make you feel dizzy. This can increase your chance of falling. Ask your doctor what else you can do to help prevent falls. Where to find more information  Centers for Disease Control and Prevention, [employer]: FootballExhibition.com.br  [employer] on Aging: https://walker.com/ Contact a doctor if:  You are afraid of falling at home.  You feel weak, drowsy, or dizzy at home.  You fall at home. Summary  There are many simple things that you can do to make your home safe and to help prevent falls.  Ways to make your home safe include removing things that can make you trip and installing grab bars in the bathroom.  Ask for help when making these changes in your home. This information is not intended to replace advice given to you by your health care provider. Make sure you discuss any questions you have with your health care provider. Document Revised: 03/16/2020 Document Reviewed: 03/16/2020 Elsevier Patient Education  2021 Elsevier Inc.   Health Maintenance, Female Adopting a healthy lifestyle and getting preventive care are important in promoting health and wellness. Ask your health care provider about:  The right schedule for you to have regular tests and exams.  Things you can do on your own to prevent diseases and keep yourself healthy. What  should I know about diet, weight, and exercise? Eat a healthy diet  Eat a diet that includes plenty of vegetables, fruits, low-fat dairy products, and lean protein.  Do not eat a lot of foods that are high in solid fats, added sugars, or sodium.   Maintain a healthy weight Body mass index (BMI) is used to identify weight problems. It estimates body fat  based on height and weight. Your health care provider can help determine your BMI and help you achieve or maintain a healthy weight. Get regular exercise Get regular exercise. This is one of the most important things you can do for your health. Most adults should:  Exercise for at least 150 minutes each week. The exercise should increase your heart rate and make you sweat (moderate-intensity exercise).  Do strengthening exercises at least twice a week. This is in addition to the moderate-intensity exercise.  Spend less time sitting. Even light physical activity can be beneficial. Watch cholesterol and blood lipids Have your blood tested for lipids and cholesterol at 79 years of age, then have this test every 5 years. Have your cholesterol levels checked more often if:  Your lipid or cholesterol levels are high.  You are older than 79 years of age.  You are at high risk for heart disease. What should I know about cancer screening? Depending on your health history and family history, you may need to have cancer screening at various ages. This may include screening for:  Breast cancer.  Cervical cancer.  Colorectal cancer.  Skin cancer.  Lung cancer. What should I know about heart disease, diabetes, and high blood pressure? Blood pressure and heart disease  High blood pressure causes heart disease and increases the risk of stroke. This is more likely to develop in people who have high blood pressure readings, are of African descent, or are overweight.  Have your blood pressure checked: ? Every 3-5 years if you are 88-58 years of age. ? Every year if you are 93 years old or older. Diabetes Have regular diabetes screenings. This checks your fasting blood sugar level. Have the screening done:  Once every three years after age 35 if you are at a normal weight and have a low risk for diabetes.  More often and at a younger age if you are overweight or have a high risk for  diabetes. What should I know about preventing infection? Hepatitis B If you have a higher risk for hepatitis B, you should be screened for this virus. Talk with your health care provider to find out if you are at risk for hepatitis B infection. Hepatitis C Testing is recommended for:  Everyone born from [birth year] through 1965.  Anyone with known risk factors for hepatitis C. Sexually transmitted infections (STIs)  Get screened for STIs, including gonorrhea and chlamydia, if: ? You are sexually active and are younger than 79 years of age. ? You are older than 79 years of age and your health care provider tells you that you are at risk for this type of infection. ? Your sexual activity has changed since you were last screened, and you are at increased risk for chlamydia or gonorrhea. Ask your health care provider if you are at risk.  Ask your health care provider about whether you are at high risk for HIV. Your health care provider may recommend a prescription medicine to help prevent HIV infection. If you choose to take medicine to prevent HIV, you should first get tested for HIV. You  should then be tested every 3 months for as long as you are taking the medicine. Pregnancy  If you are about to stop having your period (premenopausal) and you may become pregnant, seek counseling before you get pregnant.  Take 400 to 800 micrograms (mcg) of folic acid every day if you become pregnant.  Ask for birth control (contraception) if you want to prevent pregnancy. Osteoporosis and menopause Osteoporosis is a disease in which the bones lose minerals and strength with aging. This can result in bone fractures. If you are 332 years old or older, or if you are at risk for osteoporosis and fractures, ask your health care provider if you should:  Be screened for bone loss.  Take a calcium or vitamin D supplement to lower your risk of fractures.  Be given hormone replacement therapy (HRT) to treat symptoms of  menopause. Follow these instructions at home: Lifestyle  Do not use any products that contain nicotine or tobacco, such as cigarettes, e-cigarettes, and chewing tobacco. If you need help quitting, ask your health care provider.  Do not use street drugs.  Do not share needles.  Ask your health care provider for help if you need support or information about quitting drugs. Alcohol use  Do not drink alcohol if: ? Your health care provider tells you not to drink. ? You are pregnant, may be pregnant, or are planning to become pregnant.  If you drink alcohol: ? Limit how much you use to 0-1 drink a day. ? Limit intake if you are breastfeeding.  Be aware of how much alcohol is in your drink. In the [place] one drink equals one 12 oz bottle of beer (355 mL), one 5 oz glass of wine (148 mL), or one 1 oz glass of hard liquor (44 mL). General instructions  Schedule regular health, dental, and eye exams.  Stay current with your vaccines.  Tell your health care provider if: ? You often feel depressed. ? You have ever been abused or do not feel safe at home. Summary  Adopting a healthy lifestyle and getting preventive care are important in promoting health and wellness.  Follow your health care provider's instructions about healthy diet, exercising, and getting tested or screened for diseases.  Follow your health care provider's instructions on monitoring your cholesterol and blood pressure. This information is not intended to replace advice given to you by your health care provider. Make sure you discuss any questions you have with your health care provider. Document Revised: 08/06/2018 Document Reviewed: 08/06/2018 Elsevier Patient Education  2021 [employer]

## 2020-10-19 NOTE — Progress Notes (Signed)
Internal Medicine Clinic Attending  Case discussed with Dr. Masoudi soon after the resident saw the patient.  We reviewed the AWV findings.  I agree with the assessment, diagnosis, and plan of care documented in the AWV note.     

## 2020-11-04 ENCOUNTER — Other Ambulatory Visit: Payer: Self-pay | Admitting: Cardiovascular Disease

## 2020-12-12 ENCOUNTER — Telehealth: Payer: Self-pay | Admitting: Cardiology

## 2020-12-12 NOTE — Telephone Encounter (Signed)
Patient is requesting to switch providers from Dr. Duke Salvia to Dr. Antoine Poche. Please advise.

## 2020-12-13 NOTE — Telephone Encounter (Signed)
I am not inclined to say yes unless the patient needs to see me in [place].  Sounds like the biggest active problem is HTN.

## 2020-12-13 NOTE — Telephone Encounter (Signed)
The patient sees Dr. Duke Salvia for general cardiology and does not want to relocate to the [place] office. She would like to see another provider at [facility].

## 2021-02-28 ENCOUNTER — Encounter: Payer: Self-pay | Admitting: *Deleted

## 2021-03-01 ENCOUNTER — Other Ambulatory Visit: Payer: Self-pay | Admitting: Internal Medicine

## 2021-03-01 DIAGNOSIS — I10 Essential (primary) hypertension: Secondary | ICD-10-CM

## 2021-03-02 DIAGNOSIS — Z20822 Contact with and (suspected) exposure to covid-19: Secondary | ICD-10-CM | POA: Diagnosis not present

## 2021-03-26 ENCOUNTER — Other Ambulatory Visit: Payer: Self-pay | Admitting: Internal Medicine

## 2021-06-08 ENCOUNTER — Other Ambulatory Visit: Payer: Self-pay | Admitting: Internal Medicine

## 2021-06-08 DIAGNOSIS — E038 Other specified hypothyroidism: Secondary | ICD-10-CM

## 2021-06-08 NOTE — Telephone Encounter (Signed)
Next appt scheduled 06/16/21 with PCP. 

## 2021-06-16 ENCOUNTER — Ambulatory Visit (INDEPENDENT_AMBULATORY_CARE_PROVIDER_SITE_OTHER): Payer: Medicare Other | Admitting: Internal Medicine

## 2021-06-16 ENCOUNTER — Other Ambulatory Visit: Payer: Self-pay

## 2021-06-16 ENCOUNTER — Encounter: Payer: Self-pay | Admitting: Internal Medicine

## 2021-06-16 VITALS — BP 173/76 | HR 70 | Temp 97.7°F | Ht 60.0 in | Wt 150.6 lb

## 2021-06-16 DIAGNOSIS — E871 Hypo-osmolality and hyponatremia: Secondary | ICD-10-CM

## 2021-06-16 DIAGNOSIS — I2510 Atherosclerotic heart disease of native coronary artery without angina pectoris: Secondary | ICD-10-CM | POA: Diagnosis not present

## 2021-06-16 DIAGNOSIS — K219 Gastro-esophageal reflux disease without esophagitis: Secondary | ICD-10-CM | POA: Diagnosis not present

## 2021-06-16 DIAGNOSIS — Z0000 Encounter for general adult medical examination without abnormal findings: Secondary | ICD-10-CM | POA: Diagnosis not present

## 2021-06-16 DIAGNOSIS — K5909 Other constipation: Secondary | ICD-10-CM

## 2021-06-16 DIAGNOSIS — E038 Other specified hypothyroidism: Secondary | ICD-10-CM | POA: Diagnosis not present

## 2021-06-16 DIAGNOSIS — E785 Hyperlipidemia, unspecified: Secondary | ICD-10-CM

## 2021-06-16 DIAGNOSIS — I10 Essential (primary) hypertension: Secondary | ICD-10-CM | POA: Diagnosis not present

## 2021-06-16 MED ORDER — LEVOTHYROXINE SODIUM 88 MCG PO TABS: 88.0000 ug | ORAL_TABLET | Freq: Every day | ORAL | 3 refills | Status: DC

## 2021-06-16 NOTE — Progress Notes (Signed)
   Subjective:    Patient ID: Vickie [name], female    DOB: 1941-03-21, 80 y.o.   MRN: [id number]  CC: 6 month follow up for HLD, Hypothyroidism  HPI  Vickie [name] is a 80 year old woman with PMH of CAD (follows with cardiology), HLD< GERD, HTN, hypothyroidism who presents for follow up.   Today Vickie [name] states that she is doing well.  She would like a refill on her synthroid.  She is seeing a new cardiologist next week and would like her cholesterol to be checked there.  She is not interested in screening exams or immunizations.  She feels less is better.   She notes that her BP is always high at the office, and she believes she has white coat hypertension.   Review of Systems  Constitutional:  Negative for activity change, appetite change and chills.  Respiratory:  Negative for cough, chest tightness and shortness of breath.   Cardiovascular:  Negative for chest pain and leg swelling.  Musculoskeletal:  Negative for arthralgias and back pain.  Skin:  Negative for color change and pallor.  Neurological:  Negative for dizziness and headaches.  Psychiatric/Behavioral:  Negative for decreased concentration and dysphoric mood.       Objective:   Physical Exam Vitals and nursing note reviewed.  Constitutional:      General: She is not in acute distress.    Appearance: Normal appearance. She is not toxic-appearing.  HENT:     Head: Normocephalic and atraumatic.  Neck:     Comments: Thyroid appears normal in size, no nodules palpated.  Cardiovascular:     Rate and Rhythm: Normal rate and regular rhythm.     Heart sounds: No murmur heard. Pulmonary:     Effort: Pulmonary effort is normal. No respiratory distress.  Musculoskeletal:        General: No swelling, tenderness, deformity or signs of injury.     Cervical back: Neck supple. No tenderness.     Right lower leg: No edema.     Left lower leg: No edema.  Skin:    General: Skin is warm and dry.  Neurological:      General: No focal deficit present.     Mental Status: She is alert. Mental status is at baseline.  Psychiatric:        Mood and Affect: Mood normal.        Behavior: Behavior normal.    CMET, TSH, CBC today.       Assessment & Plan:  Return in 6 months.

## 2021-06-16 NOTE — Assessment & Plan Note (Signed)
We discussed cancer screening and immunizations today and she has expressed preference not to do any of these things at this time. Will continue to discuss with her at every visit.

## 2021-06-16 NOTE — Assessment & Plan Note (Signed)
She is doing well on current dose.  She has no new nodules or any complaints with swallowing or swelling of the neck.  Exam showed a normal sized thyroid today with smooth margins.   Plan Check TSH Continue synthroid

## 2021-06-16 NOTE — Patient Instructions (Signed)
Thank you for coming to see me today.   Please continue all of your medications.  We will check some blood work today and I will contact you with any changes.   Please discuss restarting your praluent with your Cardiologist.   Take Care  Debe Coder, MD

## 2021-06-16 NOTE — Assessment & Plan Note (Signed)
Mildly low at last check, 133.  Will recheck BMET today.

## 2021-06-16 NOTE — Assessment & Plan Note (Signed)
She was previously on praluent.  She has not been getting this and feels like she doesn't need it.  I advised her to speak with cardiology next week.  They will likely do a lipid panel at that time.   Plan Follow up with Cardiology.

## 2021-06-16 NOTE — Assessment & Plan Note (Signed)
She notes no chest pain and no issues with her medications. No headaches or dizziness. No falls.   Plan Continue aspirin, IMDUR and prn NTG.

## 2021-06-16 NOTE — Assessment & Plan Note (Signed)
She notes no issues today.  Chronic and controlled.   Continue famotidine

## 2021-06-16 NOTE — Assessment & Plan Note (Signed)
BP Is elevated today.  She reports a history of white coat hypertension.  When she has taken her BP in the past at home, it is always controlled.  She has also been overmedicated due to targeting her in office BP in the past.  She would like to maintain her medications at the same doses today as she feels comfortable with these doses.    Plan Continue lisinopril 10mg  and nebivolol 10mg  Follow up with Cardiology next week.

## 2021-06-17 LAB — CMP14 + ANION GAP
ALT: 16 IU/L (ref 0–32)
AST: 20 IU/L (ref 0–40)
Albumin/Globulin Ratio: 2.1 (ref 1.2–2.2)
Albumin: 4.7 g/dL (ref 3.7–4.7)
Alkaline Phosphatase: 75 IU/L (ref 44–121)
Anion Gap: 14 mmol/L (ref 10.0–18.0)
BUN/Creatinine Ratio: 17 (ref 12–28)
BUN: 14 mg/dL (ref 8–27)
Bilirubin Total: 0.2 mg/dL (ref 0.0–1.2)
CO2: 23 mmol/L (ref 20–29)
Calcium: 9.4 mg/dL (ref 8.7–10.3)
Chloride: 105 mmol/L (ref 96–106)
Creatinine, Ser: 0.81 mg/dL (ref 0.57–1.00)
Globulin, Total: 2.2 g/dL (ref 1.5–4.5)
Glucose: 106 mg/dL — ABNORMAL HIGH (ref 70–99)
Potassium: 5.2 mmol/L (ref 3.5–5.2)
Sodium: 142 mmol/L (ref 134–144)
Total Protein: 6.9 g/dL (ref 6.0–8.5)
eGFR: 74 mL/min/{1.73_m2} (ref >59)

## 2021-06-17 LAB — CBC
Hematocrit: 43.3 % (ref 34.0–46.6)
Hemoglobin: 14.4 g/dL (ref 11.1–15.9)
MCH: 30.9 pg (ref 26.6–33.0)
MCHC: 33.3 g/dL (ref 31.5–35.7)
MCV: 93 fL (ref 79–97)
Platelets: 349 10*3/uL (ref 150–450)
RBC: 4.66 x10E6/uL (ref 3.77–5.28)
RDW: 12.5 % (ref 11.7–15.4)
WBC: 9 10*3/uL (ref 3.4–10.8)

## 2021-06-17 LAB — TSH: TSH: 1.79 u[IU]/mL (ref 0.450–4.500)

## 2021-06-20 NOTE — Progress Notes (Signed)
Cardiology Office Note:    Date:  06/21/2021   ID:  Vickie [name], DOB Apr 13, 1941, MRN [id number]  PCP:  Thomasene Ripple, DO [facility] Cardiologist: Chilton Si, MD  Reason for visit: 1 year follow-up  History of Present Illness:    Vickie [name] is a 80 y.o. female with a hx of CAD status post DES to LAD and OM, hypertension, hypothyroidism, hyperlipidemia, history of fatigue on beta-blockers (better on nevibolol), statin intolerance (transaminitis with atorvastatin, drug-induced constipation and right flank pain with rosuvastatin).  She last saw Dr. Duke Salvia in October 2021 and was doing well.     Today, she states she is doing well.  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  She lives with her youngest son.  Yesterday she was in the garden for 9 hours without any chest pain or shortness of breath.  She does have trouble with her hips, making it difficult for her to walk a long distance.  She does notice that if she skips medications, "she will feel it."   When she takes her medications, she only notices chest pain if she is emotionally upset.  She denies lightheadedness, syncope, bleeding, PND, orthopnea and palpitations.  She mentions she has not been taking Praluent for several months.  With Dr. Duke Salvia changing to another office location and patient missing some blood work, her Praluent was not renewed.  She was wondering if she really needed it anyway.  Past Medical History:  Diagnosis Date   CAD (coronary artery disease)    Chronic diastolic heart failure (HCC) 01/20/2016   Grade I by Echo 01/19/2016   Chronic urticaria 03/16/2013   Essential hypertension 08/14/2006   Gallstones    Gastroesophageal reflux disease 07/03/2013   Symptoms are not significant enough that patient wants pharmacologic therapy at this point    Hyperlipidemia LDL goal < 130 07/03/2013   Hypothyroidism 08/14/2006   Overweight (BMI 25.0-29.9) 07/03/2013    Past  Surgical History:  Procedure Laterality Date   CARDIAC CATHETERIZATION N/A 01/06/2016   Procedure: Left Heart Cath and Coronary Angiography;  Surgeon: Thurmon Fair, MD;  Location: [facility];  Service: Cardiovascular;  Laterality: N/A;   CARDIAC CATHETERIZATION N/A 01/06/2016   Procedure: Coronary Stent Intervention;  Surgeon: Lennette Bihari, MD;  Location: [facility];  Service: Cardiovascular;  Laterality: N/A;   CHOLECYSTECTOMY     TUBAL LIGATION      Current Medications: Current Meds  Medication Sig   aspirin 81 MG tablet Take 1 tablet (81 mg total) by mouth daily.   famotidine (PEPCID) 20 MG tablet TAKE 1 TABLET BY MOUTH TWICE A DAY   ibuprofen (ADVIL) 200 MG tablet Take 200-400 mg by mouth every 6 (six) hours as needed for mild pain (or discomfort).   isosorbide mononitrate (IMDUR) 60 MG 24 hr tablet TAKE 1 TABLET BY MOUTH EVERY DAY   levothyroxine (SYNTHROID) 88 MCG tablet Take 1 tablet (88 mcg total) by mouth daily before breakfast.   lisinopril (ZESTRIL) 10 MG tablet TAKE 1 TABLET BY MOUTH EVERY DAY   nebivolol (BYSTOLIC) 10 MG tablet TAKE 1 TABLET (10 MG TOTAL) BY MOUTH DAILY.   nitroGLYCERIN (NITROSTAT) 0.4 MG SL tablet Place 1 tablet (0.4 mg total) under the tongue every 5 (five) minutes as needed for chest pain.     Allergies:   Atorvastatin and Rosuvastatin   Social History   Socioeconomic History   Marital status: Divorced  Spouse name: Not on file   Number of children: 3   Years of education: Not on file   Highest education level: Not on file  Occupational History   Occupation: Retired   Tobacco Use   Smoking status: Never   Smokeless tobacco: Never  Vaping Use   Vaping Use: Never used  Substance and Sexual Activity   Alcohol use: Yes    Comment: Rarely.   Drug use: No   Sexual activity: Never  Other Topics Concern   Not on file  Social History Narrative   Current Social History 10/04/2020        Patient lives with family (younger son,  2 granddaughters, and grandson) in a suite over the garage which is 2 story. There are steps up to the entrance the patient uses the steps are a lift chair.       Patient's method of transportation is via family member.      The highest level of education was some college.      The patient currently retired.      Identified important Relationships are Sons, family       Pets : a dog she takes on walks       Interests / Fun: gardening       Current Stressors: transportation issues       Religious / Personal Beliefs: Christian       Other:     Social Determinants of Corporate investment banker Strain: Low Risk    Difficulty of Paying Living Expenses: Not hard at all  Food Insecurity: No Food Insecurity   Worried About Programme researcher, broadcasting/film/video in the Last Year: Never true   Barista in the Last Year: Never true  Transportation Needs: Personal assistant (Medical): Yes   Lack of Transportation (Non-Medical): Yes  Physical Activity: Not on file  Stress: Not on file  Social Connections: Unknown   Frequency of Communication with Friends and Family: More than three times a week   Frequency of Social Gatherings with Friends and Family: Not on file   Attends Religious Services: Not on Scientist, clinical (histocompatibility and immunogenetics) or Organizations: Not on file   Attends Banker Meetings: Not on file   Marital Status: Divorced     Family History: The patient's family history includes Bladder Cancer in her maternal aunt and son; Breast cancer in her sister; Diabetes in her son; Healthy in her brother, sister, sister, and son; Heart disease in her mother; Lung cancer in her sister; Unexplained death in her father. There is no history of Colon cancer or Esophageal cancer.  ROS:   Please see the history of present illness.     EKGs/Labs/Other Studies Reviewed:    EKG:  The ekg ordered today demonstrates sinus rhythm, low voltage, no ST changes.  Heart  rate 74, PR interval 160 ms.  QRS duration 78 ms.  Recent Labs: 06/25/2020: Magnesium 2.4 06/16/2021: ALT 16; BUN 14; Creatinine, Ser 0.81; Hemoglobin 14.4; Platelets 349; Potassium 5.2; Sodium 142; TSH 1.790   Recent Lipid Panel Lab Results  Component Value Date/Time   CHOL 91 (L) 08/13/2019 09:20 AM   TRIG 118 08/13/2019 09:20 AM   HDL 41 08/13/2019 09:20 AM   LDLCALC 29 08/13/2019 09:20 AM    Physical Exam:    VS:  BP 140/86 (BP Location: Left Arm, Patient Position: Sitting, Cuff Size: Normal)   Pulse 74  Ht 5' (1.524 m)   Wt 148 lb (67.1 kg)   LMP 10/20/1991   BMI 28.90 kg/m    No data found.  Wt Readings from Last 3 Encounters:  06/21/21 148 lb (67.1 kg)  06/16/21 150 lb 9.6 oz (68.3 kg)  06/22/20 147 lb 11.3 oz (67 kg)     GEN:  Well nourished, well developed in no acute distress, looks younger than stated age. HEENT: Normal NECK: No JVD; No carotid bruits CARDIAC: RRR, no murmurs, rubs, gallops RESPIRATORY:  Clear to auscultation without rales, wheezing or rhonchi  ABDOMEN: Soft, non-tender, non-distended MUSCULOSKELETAL: No edema; No deformity  SKIN: Warm and dry NEUROLOGIC:  Alert and oriented PSYCHIATRIC:  Normal affect     ASSESSMENT AND PLAN    CAD s/p DES to LAD & OM, no angina -No ischemic changes on EKG. -Continue aspirin 81 mg daily.  Continue Imdur for antianginal treatment. -Check lipids since she has been off Praluent.  She has history of statin intolerance.  Hypertension (White coat HTN), reasonably controlled for her age -Continue lisinopril 10 mg daily. -Previous hypotension when lisinopril was increased. -Given information on low potassium diet.  Patient's K was 5.2 last week. -Recommend DASH diet (high in vegetables, fruits, low-fat dairy products, whole grains, poultry, fish, and nuts and low in sweets, sugar-sweetened beverages, and red meats), salt restriction and increase physical activity.  Hyperlipidemia -LDL 29 in December  2020.  She has been off Praluent due to change of cardiology provider and lack of prescription renewal. -Recheck lipids today.  If LDL is mildly elevated, would consider Zetia.  If significantly elevated, recommend restarting Praluent. -Discussed cholesterol lowering diets - Mediterranean diet, DASH diet, vegetarian diet, low-carbohydrate diet and avoidance of trans fats.  Discussed healthier choice substitutes.  Nuts, high-fiber foods, and fiber supplements may also improve lipids.    Disposition - Follow-up in 1 year unless symptoms develop prior to then.          Medication Adjustments/Labs and Tests Ordered: Current medicines are reviewed at length with the patient today.  Concerns regarding medicines are outlined above.  Orders Placed This Encounter  Procedures   Lipid panel   EKG 12-Lead   No orders of the defined types were placed in this encounter.   Patient Instructions  Medication Instructions:  No Changes *If you need a refill on your cardiac medications before your next appointment, please call your pharmacy*   Lab Work: Lipids : Today If you have labs (blood work) drawn today and your tests are completely normal, you will receive your results only by: MyChart Message (if you have MyChart) OR A paper copy in the mail If you have any lab test that is abnormal or we need to change your treatment, we will call you to review the results.   Testing/Procedures: No Testing   Follow-Up: At [facility], you and your health needs are our priority.  As part of our continuing mission to provide you with exceptional heart care, we have created designated Provider Care Teams.  These Care Teams include your primary Cardiologist (physician) and Advanced Practice Providers (APPs -  Physician Assistants and Nurse Practitioners) who all work together to provide you with the care you need, when you need it.  We recommend signing up for the patient portal called "MyChart".  Sign  up information is provided on this After Visit Summary.  MyChart is used to connect with patients for Virtual Visits (Telemedicine).  Patients are able to view lab/test  results, encounter notes, upcoming appointments, etc.  Non-urgent messages can be sent to your provider as well.   To learn more about what you can do with MyChart, go to [email or web address].    Your next appointment:   1 year(s)  The format for your next appointment:   In Person  Provider:   Thomasene Ripple, MD  Elton Sin, Cannon Kettle, PA-C  06/21/2021 9:34 AM    [facility]

## 2021-06-21 ENCOUNTER — Other Ambulatory Visit: Payer: Self-pay

## 2021-06-21 ENCOUNTER — Ambulatory Visit (INDEPENDENT_AMBULATORY_CARE_PROVIDER_SITE_OTHER): Payer: Medicare Other | Admitting: Physician Assistant

## 2021-06-21 ENCOUNTER — Encounter: Payer: Self-pay | Admitting: Physician Assistant

## 2021-06-21 VITALS — BP 140/86 | HR 74 | Ht 60.0 in | Wt 148.0 lb

## 2021-06-21 DIAGNOSIS — I10 Essential (primary) hypertension: Secondary | ICD-10-CM

## 2021-06-21 DIAGNOSIS — I2510 Atherosclerotic heart disease of native coronary artery without angina pectoris: Secondary | ICD-10-CM

## 2021-06-21 DIAGNOSIS — E7800 Pure hypercholesterolemia, unspecified: Secondary | ICD-10-CM

## 2021-06-21 LAB — LIPID PANEL
Chol/HDL Ratio: 6 ratio — ABNORMAL HIGH (ref 0.0–4.4)
Cholesterol, Total: 199 mg/dL (ref 100–199)
HDL: 33 mg/dL — ABNORMAL LOW (ref >39)
LDL Chol Calc (NIH): 124 mg/dL — ABNORMAL HIGH (ref 0–99)
Triglycerides: 237 mg/dL — ABNORMAL HIGH (ref 0–149)
VLDL Cholesterol Cal: 42 mg/dL — ABNORMAL HIGH (ref 5–40)

## 2021-06-21 NOTE — Patient Instructions (Signed)
Medication Instructions:  No Changes *If you need a refill on your cardiac medications before your next appointment, please call your pharmacy*   Lab Work: Lipids : Today If you have labs (blood work) drawn today and your tests are completely normal, you will receive your results only by: MyChart Message (if you have MyChart) OR A paper copy in the mail If you have any lab test that is abnormal or we need to change your treatment, we will call you to review the results.   Testing/Procedures: No Testing   Follow-Up: At [facility], you and your health needs are our priority.  As part of our continuing mission to provide you with exceptional heart care, we have created designated Provider Care Teams.  These Care Teams include your primary Cardiologist (physician) and Advanced Practice Providers (APPs -  Physician Assistants and Nurse Practitioners) who all work together to provide you with the care you need, when you need it.  We recommend signing up for the patient portal called "MyChart".  Sign up information is provided on this After Visit Summary.  MyChart is used to connect with patients for Virtual Visits (Telemedicine).  Patients are able to view lab/test results, encounter notes, upcoming appointments, etc.  Non-urgent messages can be sent to your provider as well.   To learn more about what you can do with MyChart, go to [email or web address].    Your next appointment:   1 year(s)  The format for your next appointment:   In Person  Provider:   Thomasene Ripple, MD  [place]

## 2021-06-22 ENCOUNTER — Telehealth: Payer: Self-pay

## 2021-06-22 NOTE — Telephone Encounter (Deleted)
Attempted to call Patient regarding results unable to leave voicemail ,due to voicemail full.

## 2021-06-22 NOTE — Telephone Encounter (Addendum)
Attempted to contact patient regarding results unable to leave message voicemail not set up.----- Message from Cannon Kettle, PA-C sent at 06/21/2021 11:15 PM EDT ----- Your cholesterol has increased greatly off Praluent.  LDL up from 29 to 124.  Triglycerides up from 118 to 237.  Zetia will not be enough to get your cholesterol to goal.  I recommend restarting Praluent.  I am confident it will get you back to goal.  Vickie [name] - please send prescription in for Praluent --- Alirocumab (PRALUENT) 75 MG/ML Inject 75 mg into the skin every 14 (fourteen) days. 6 mL  3 refills

## 2021-06-23 ENCOUNTER — Telehealth: Payer: Self-pay

## 2021-06-23 NOTE — Telephone Encounter (Addendum)
Attempted to contact patient unable to leave message voicemail not set up.----- Message from Cannon Kettle, PA-C sent at 06/21/2021 11:15 PM EDT ----- Your cholesterol has increased greatly off Praluent.  LDL up from 29 to 124.  Triglycerides up from 118 to 237.  Zetia will not be enough to get your cholesterol to goal.  I recommend restarting Praluent.  I am confident it will get you back to goal.  Judeth Cornfield - please send prescription in for Praluent --- Alirocumab (PRALUENT) 75 MG/ML Inject 75 mg into the skin every 14 (fourteen) days. 6 mL  3 refills

## 2021-06-28 ENCOUNTER — Encounter: Payer: Self-pay | Admitting: Internal Medicine

## 2021-06-30 NOTE — Telephone Encounter (Signed)
Attempted to call patient with results. I could not leave a voice message due to voice mailbox is not set up. Will try calling again

## 2021-07-11 DIAGNOSIS — Z20828 Contact with and (suspected) exposure to other viral communicable diseases: Secondary | ICD-10-CM | POA: Diagnosis not present

## 2021-08-10 ENCOUNTER — Other Ambulatory Visit: Payer: Self-pay | Admitting: Internal Medicine

## 2021-12-14 DIAGNOSIS — Z20822 Contact with and (suspected) exposure to covid-19: Secondary | ICD-10-CM | POA: Diagnosis not present

## 2021-12-15 ENCOUNTER — Encounter: Payer: Medicare Other | Admitting: Internal Medicine

## 2021-12-15 ENCOUNTER — Encounter: Payer: Self-pay | Admitting: Internal Medicine

## 2022-01-01 DIAGNOSIS — Z20822 Contact with and (suspected) exposure to covid-19: Secondary | ICD-10-CM | POA: Diagnosis not present

## 2022-03-13 ENCOUNTER — Other Ambulatory Visit: Payer: Self-pay | Admitting: Internal Medicine

## 2022-03-13 DIAGNOSIS — I10 Essential (primary) hypertension: Secondary | ICD-10-CM

## 2022-03-23 ENCOUNTER — Ambulatory Visit (INDEPENDENT_AMBULATORY_CARE_PROVIDER_SITE_OTHER): Payer: Medicare Other | Admitting: Internal Medicine

## 2022-03-23 ENCOUNTER — Encounter: Payer: Self-pay | Admitting: Internal Medicine

## 2022-03-23 ENCOUNTER — Other Ambulatory Visit: Payer: Self-pay

## 2022-03-23 DIAGNOSIS — E785 Hyperlipidemia, unspecified: Secondary | ICD-10-CM

## 2022-03-23 DIAGNOSIS — I10 Essential (primary) hypertension: Secondary | ICD-10-CM | POA: Diagnosis not present

## 2022-03-23 DIAGNOSIS — E871 Hypo-osmolality and hyponatremia: Secondary | ICD-10-CM

## 2022-03-23 DIAGNOSIS — E038 Other specified hypothyroidism: Secondary | ICD-10-CM | POA: Diagnosis not present

## 2022-03-23 DIAGNOSIS — I209 Angina pectoris, unspecified: Secondary | ICD-10-CM

## 2022-03-23 DIAGNOSIS — Z0000 Encounter for general adult medical examination without abnormal findings: Secondary | ICD-10-CM

## 2022-03-23 DIAGNOSIS — K219 Gastro-esophageal reflux disease without esophagitis: Secondary | ICD-10-CM

## 2022-03-23 MED ORDER — NEBIVOLOL HCL 10 MG PO TABS: 10.0000 mg | ORAL_TABLET | Freq: Every day | ORAL | 3 refills | Status: DC

## 2022-03-23 MED ORDER — NITROGLYCERIN 0.4 MG SL SUBL: 0.4000 mg | SUBLINGUAL_TABLET | SUBLINGUAL | 0 refills | Status: DC | PRN

## 2022-03-23 MED ORDER — ISOSORBIDE MONONITRATE ER 60 MG PO TB24: 60.0000 mg | ORAL_TABLET | Freq: Every day | ORAL | 3 refills | Status: DC

## 2022-03-23 MED ORDER — LEVOTHYROXINE SODIUM 88 MCG PO TABS: 88.0000 ug | ORAL_TABLET | Freq: Every day | ORAL | 3 refills | Status: DC

## 2022-03-23 MED ORDER — PRALUENT 75 MG/ML ~~LOC~~ SOAJ: SUBCUTANEOUS | 3 refills | Status: DC

## 2022-03-23 MED ORDER — LISINOPRIL 10 MG PO TABS: 10.0000 mg | ORAL_TABLET | Freq: Every day | ORAL | 3 refills | Status: DC

## 2022-03-23 NOTE — Assessment & Plan Note (Signed)
Blood pressure is well controlled on imdur, nebivolol, lisinopril.   Check CMET today Continue triple therapy.

## 2022-03-23 NOTE — Assessment & Plan Note (Signed)
Patient has declined DEXA, Tdap, PCV, and Shingrix today. We discussed the importance of getting preventative care and she expressed that she is not interested. Will plan to follow up at next visit.

## 2022-03-23 NOTE — Assessment & Plan Note (Addendum)
Patient has chronic stable hypothyroidism managed with levothyroxine 88 mcg. Patient has not reported any constitutional or mood changes and denies neck swelling. Physical exam was consistent with normal thyroid.  Plan: -Check TSH today -BMP  --Continue levothyroxine 88 mcg

## 2022-03-23 NOTE — Assessment & Plan Note (Signed)
She has good control of symptoms with pepcid, continue.

## 2022-03-23 NOTE — Progress Notes (Unsigned)
Attestation for Student Documentation:  I personally was present and performed or re-performed the history, physical exam and medical decision-making activities of this service and have verified that the service and findings are accurately documented in the student's note.  Inez Catalina, MD 03/23/2022, 4:20 PM

## 2022-03-23 NOTE — Assessment & Plan Note (Signed)
On last BMET, this had resolved.  Will double check with CMET today.

## 2022-03-23 NOTE — Assessment & Plan Note (Addendum)
Patient has chronic HLA that is currently uncontrolled. On chart review, her most recent cardiology appointment was in 05/2021 and lipid profile showed LDL of 124. Cardiology tried to get in contact with patient to restart Praluent but was not able to reach her. Patient said she previously did well on it and was amenable to restarting.  Plan: -Restart Praluent today -Lipid profile today

## 2022-03-23 NOTE — Patient Instructions (Addendum)
Thank you for seeing [place] today!  High Cholesterol Please restart your Praluent and let us know if you have any issues with this medication.   2.  Labs We did some bloodwork this visit and we will contact you with any abnormal results.   2.   Refills We sent your refills to [facility] on [address], please let us know if you have any issues obtaining your medications.

## 2022-03-23 NOTE — Progress Notes (Signed)
Subjective:   Patient ID: Vickie [name] female   DOB: 02/12/1941 81 y.o.   MRN: [id number]  HPI: [name] is a 81 y.o.  woman with a pmhx of stable HTN, hyperlipidemia, and hypothyroidism who presents for follow-up.   She reports that she is generally feeling well. She denies any mood changes, neck swelling, excessive fatigue, nausea/vomiting, or bowel movement changes. She reports that she has been active in her garden over the summer.  Patient was previously taking Praluent but reports she stopped taking it over a year ago because she ran out and never refilled it. She follows with Cardiology once a year.   Patient Active Problem List   Diagnosis Date Noted   Hyponatremia 06/22/2020   Chronic constipation 56/25/6389   Diastolic dysfunction without heart failure 01/20/2016   CAD in native artery 01/07/2016   Hyperlipidemia 07/03/2013   Overweight (BMI 25.0-29.9) 07/03/2013   Healthcare maintenance 07/03/2013   Gastroesophageal reflux disease 07/03/2013   Chronic urticaria 03/16/2013   Hypothyroidism 08/14/2006   Essential hypertension 08/14/2006     Current Outpatient Medications  Medication Sig Dispense Refill   Alirocumab (PRALUENT) 75 MG/ML SOAJ ADMINISTER 1 ML UNDER THE SKIN EVERY 14 DAYS 6 mL 3   aspirin 81 MG tablet Take 1 tablet (81 mg total) by mouth daily. 30 tablet 6   famotidine (PEPCID) 20 MG tablet TAKE 1 TABLET BY MOUTH TWICE A DAY 180 tablet 3   ibuprofen (ADVIL) 200 MG tablet Take 200-400 mg by mouth every 6 (six) hours as needed for mild pain (or discomfort).     isosorbide mononitrate (IMDUR) 60 MG 24 hr tablet Take 1 tablet (60 mg total) by mouth daily. 90 tablet 3   levothyroxine (SYNTHROID) 88 MCG tablet Take 1 tablet (88 mcg total) by mouth daily before breakfast. 90 tablet 3   lisinopril (ZESTRIL) 10 MG tablet Take 1 tablet (10 mg total) by mouth daily. 90 tablet 3   nebivolol (BYSTOLIC) 10 MG tablet Take 1 tablet (10 mg total) by  mouth daily. 90 tablet 3   nitroGLYCERIN (NITROSTAT) 0.4 MG SL tablet Place 1 tablet (0.4 mg total) under the tongue every 5 (five) minutes as needed for chest pain. 90 tablet 0   No current facility-administered medications for this visit.     Review of Systems: Constitutional: negative for fatigue and malaise Respiratory: negative for SOB Cardiovascular: negative for palpitations Gastrointestinal: negative for change in bowel habits, nausea, and vomiting Behavioral/Psych: negative for anxiety and depression   Objective:   Physical Exam: Vitals:   03/23/22 0920  BP: 128/64  Pulse: 74  Temp: 97.8 F (36.6 C)  TempSrc: Oral  SpO2: 98%  Weight: 151 lb 14.4 oz (68.9 kg)  Height: 5' (1.524 m)   Physical Exam Vitals reviewed.  Constitutional:      General: She is not in acute distress.    Appearance: Normal appearance.  HENT:     Head: Normocephalic and atraumatic.  Neck:     Comments: No swelling or nodules on palpation. Cardiovascular:     Rate and Rhythm: Normal rate and regular rhythm.     Pulses: Normal pulses.     Heart sounds: Murmur heard.  Pulmonary:     Effort: Pulmonary effort is normal.     Breath sounds: Normal breath sounds.  Abdominal:     General: Abdomen is flat. Bowel sounds are normal.     Palpations: Abdomen is soft.  Musculoskeletal:  Cervical back: Neck supple.  Neurological:     Mental Status: She is alert.      Assessment & Plan:   Hyperlipidemia Patient has chronic HLA that is currently uncontrolled. On chart review, her most recent cardiology appointment was in 05/2021 and lipid profile showed LDL of 124. Cardiology tried to get in contact with patient to restart Praluent but was not able to reach her. Patient said she previously did well on it and was amenable to restarting.  Plan: -Restart Praluent today -Lipid profile today  Hypothyroidism Patient has chronic stable hypothyroidism managed with levothyroxine 88 mcg. Patient  has not reported any constitutional or mood changes and denies neck swelling. Physical exam was consistent with normal thyroid.  Plan: -Check TSH today -BMP  --Continue levothyroxine 88 mcg  Healthcare maintenance Patient has declined DEXA, Tdap, PCV, and Shingrix today. We discussed the importance of getting preventative care and she expressed that she is not interested. Will plan to follow up at next visit.

## 2022-03-24 LAB — BMP8+ANION GAP
Anion Gap: 13 mmol/L (ref 10.0–18.0)
BUN/Creatinine Ratio: 15 (ref 12–28)
BUN: 13 mg/dL (ref 8–27)
CO2: 25 mmol/L (ref 20–29)
Calcium: 9.7 mg/dL (ref 8.7–10.3)
Chloride: 102 mmol/L (ref 96–106)
Creatinine, Ser: 0.84 mg/dL (ref 0.57–1.00)
Glucose: 111 mg/dL — ABNORMAL HIGH (ref 70–99)
Potassium: 5.3 mmol/L — ABNORMAL HIGH (ref 3.5–5.2)
Sodium: 140 mmol/L (ref 134–144)
eGFR: 71 mL/min/{1.73_m2} (ref >59)

## 2022-03-24 LAB — LIPID PANEL
Chol/HDL Ratio: 5.8 ratio — ABNORMAL HIGH (ref 0.0–4.4)
Cholesterol, Total: 204 mg/dL — ABNORMAL HIGH (ref 100–199)
HDL: 35 mg/dL — ABNORMAL LOW (ref >39)
LDL Chol Calc (NIH): 140 mg/dL — ABNORMAL HIGH (ref 0–99)
Triglycerides: 159 mg/dL — ABNORMAL HIGH (ref 0–149)
VLDL Cholesterol Cal: 29 mg/dL (ref 5–40)

## 2022-03-24 LAB — TSH: TSH: 0.768 u[IU]/mL (ref 0.450–4.500)

## 2022-03-24 LAB — HCV AB W REFLEX TO QUANT PCR: HCV Ab: NONREACTIVE

## 2022-03-24 LAB — HCV INTERPRETATION

## 2022-04-06 ENCOUNTER — Other Ambulatory Visit: Payer: Self-pay | Admitting: Internal Medicine

## 2022-04-06 DIAGNOSIS — I209 Angina pectoris, unspecified: Secondary | ICD-10-CM

## 2022-04-12 IMAGING — CT CT ANGIO CHEST
2 of 7 series · 19 of 46 positions shown · IV contrast (omnipaque)
Comparison: None.

CLINICAL DATA: Chest pain or shortness of breath.  COVID positive

EXAM:
CT ANGIOGRAPHY CHEST WITH CONTRAST
TECHNIQUE: Multidetector CT imaging of the chest was performed using the
standard protocol during bolus administration of intravenous
contrast. Multiplanar CT image reconstructions and MIPs were
obtained to evaluate the vascular anatomy.
CONTRAST:  100mL OMNIPAQUE IOHEXOL 350 MG/ML SOLN

[Series 6: thins · axial · 0.85mm/px · z∈[-332,-68]mm · 16 of 296 slices shown]
[im 16/296  lung]
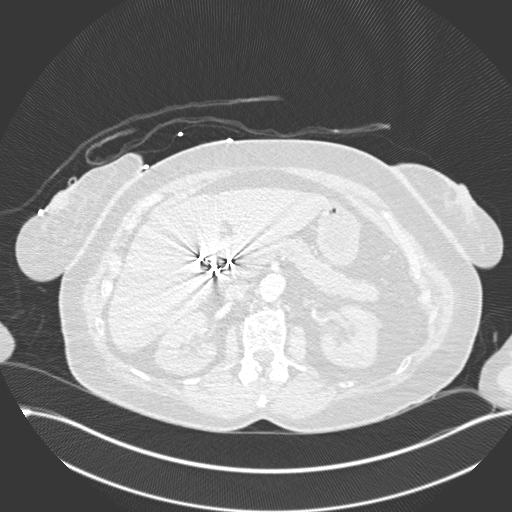
[im 32/296  soft-tissue]
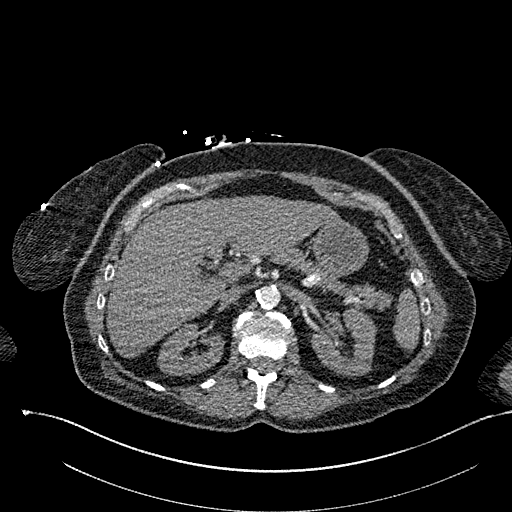
[im 47/296  lung]
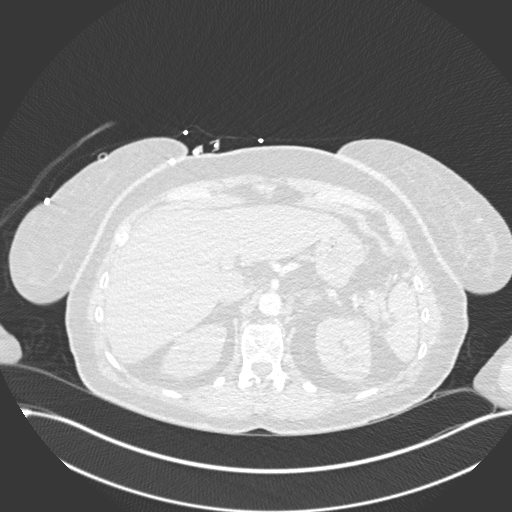
[im 63/296  soft-tissue]
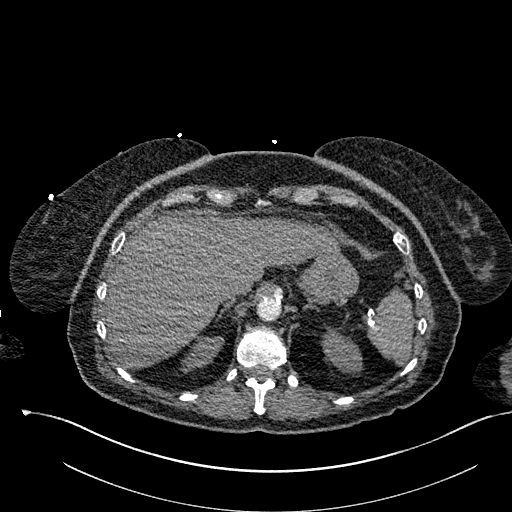
[im 94/296  lung]
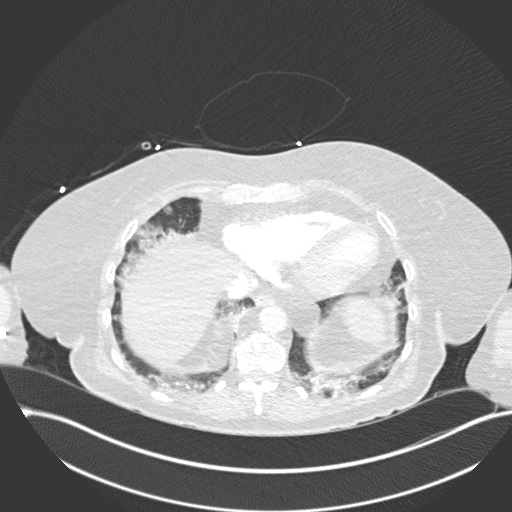
[im 109/296  soft-tissue]
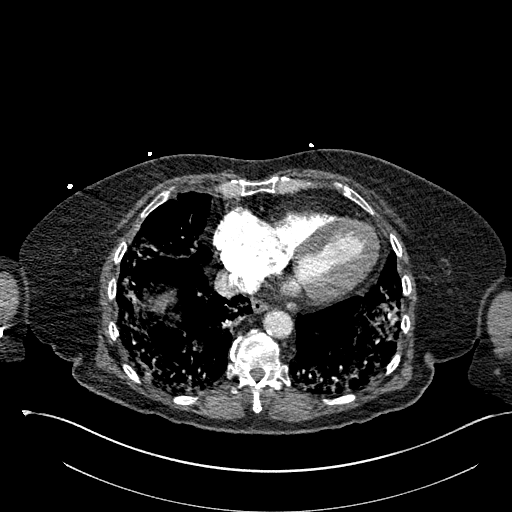
[im 125/296  lung]
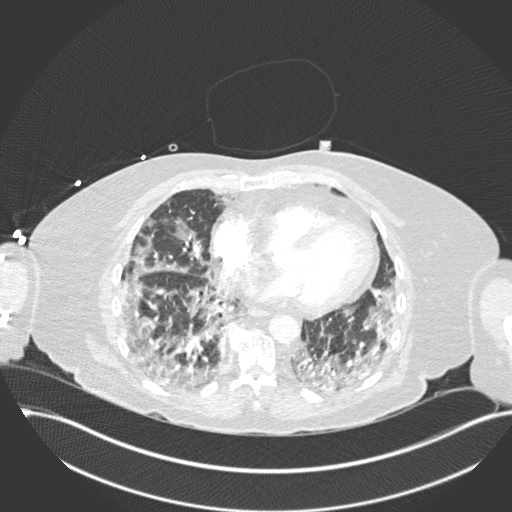
[im 140/296  soft-tissue]
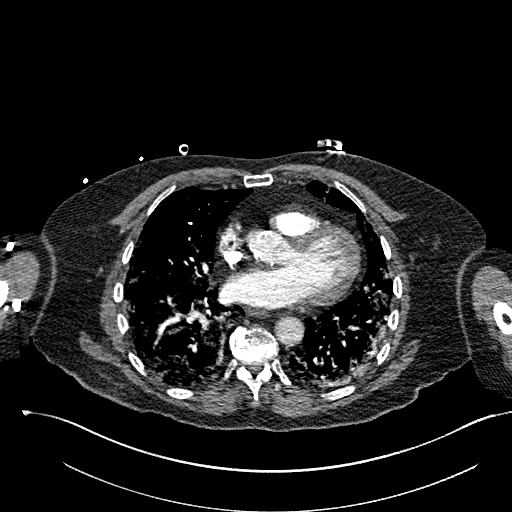
[im 156/296  lung]
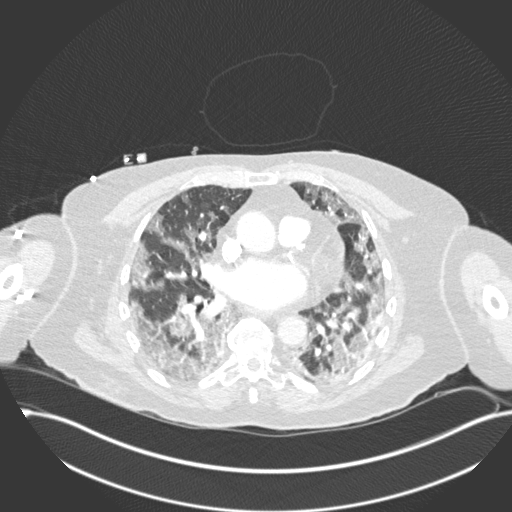
[im 171/296  soft-tissue]
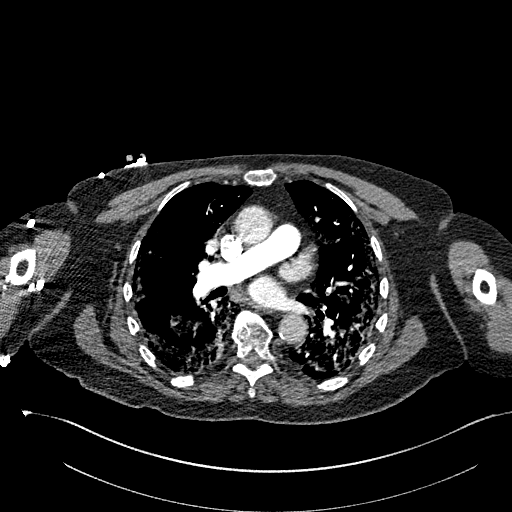
[im 187/296  lung]
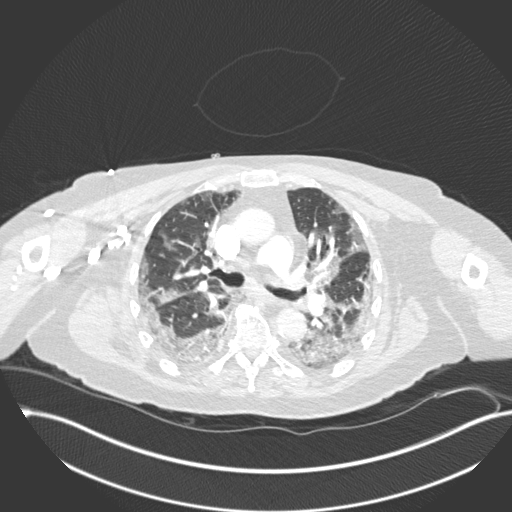
[im 202/296  soft-tissue]
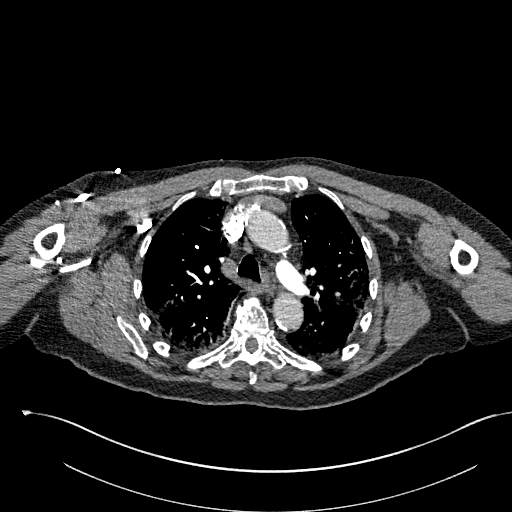
[im 233/296  lung]
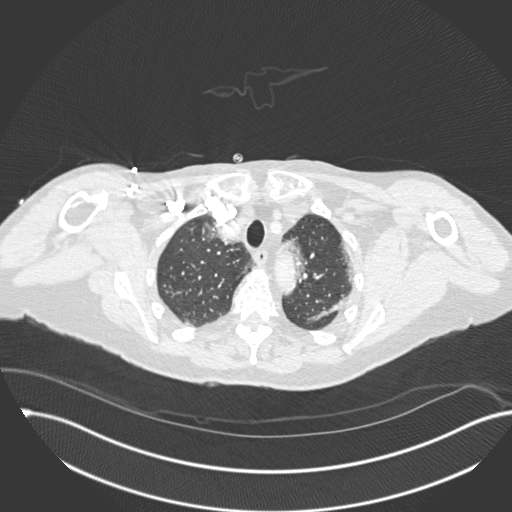
[im 249/296  soft-tissue]
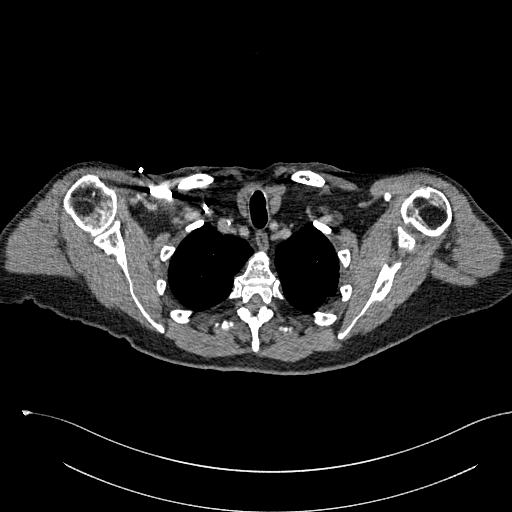
[im 264/296  lung]
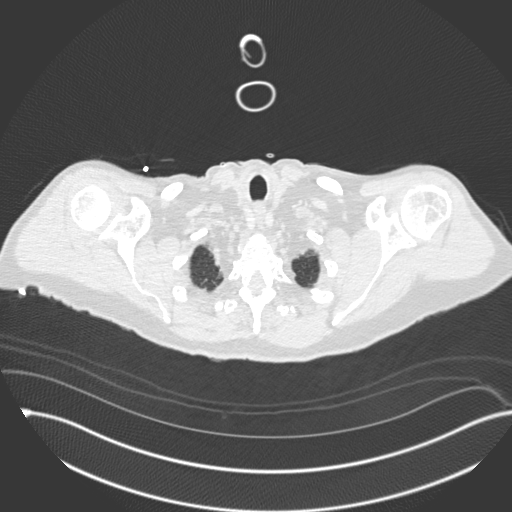
[im 280/296  soft-tissue]
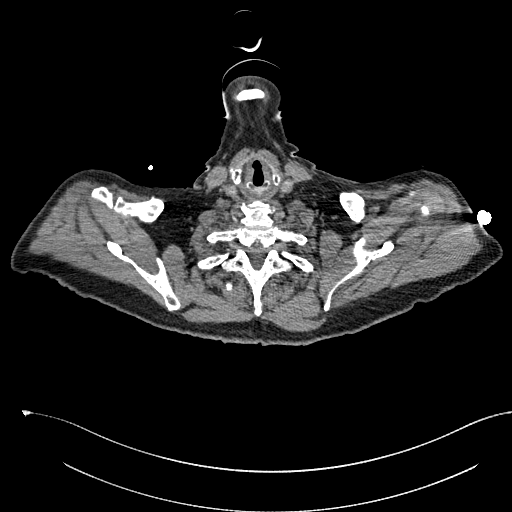

[Series 7: coronal mpr · coronal · 0.59mm/px · 3 of 128 slices shown]
[im 32/128  soft-tissue]
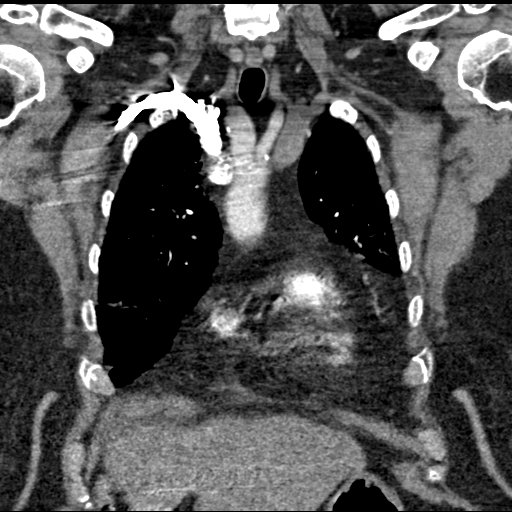
[im 64/128  soft-tissue]
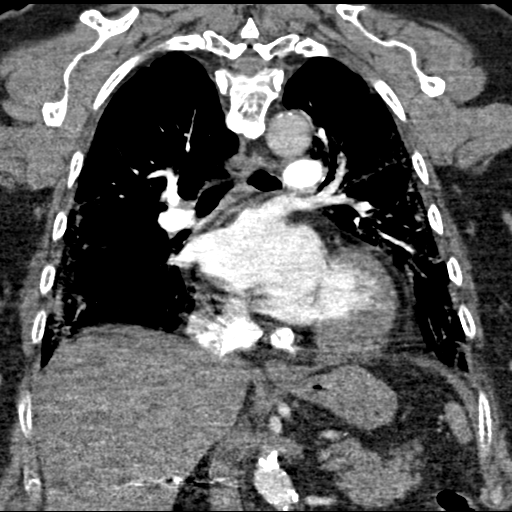
[im 96/128  soft-tissue]
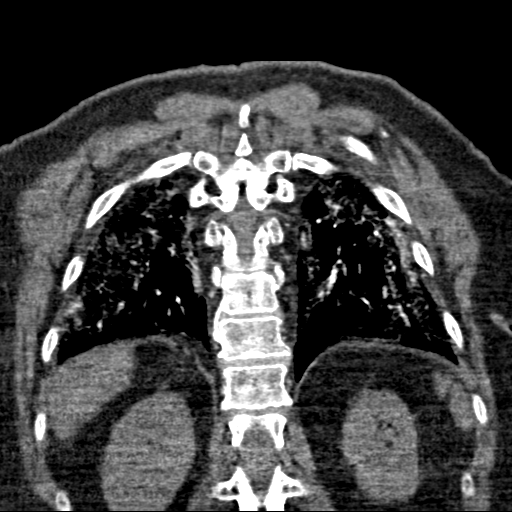

[19 of 46 positions shown; findings below may reference images not displayed]

FINDINGS: Cardiovascular: Satisfactory opacification of the pulmonary arteries
to the segmental level. No evidence of pulmonary embolism. Normal
heart size. No pericardial effusion. Aortic and coronary
atherosclerotic calcification

Mediastinum/Nodes: Negative for adenopathy or mass

Lungs/Pleura: Ground-glass opacity with subpleural predilection and
intervening normal lung. No effusion or air leak seen.

Upper Abdomen: Cholecystectomy

Musculoskeletal: No acute or aggressive finding.  Spondylosis.

Review of the MIP images confirms the above findings.
IMPRESSION: 1. COVID pattern pneumonia.
2. Negative for pulmonary embolism.

## 2022-05-08 ENCOUNTER — Telehealth: Payer: Self-pay | Admitting: Internal Medicine

## 2022-05-08 ENCOUNTER — Telehealth: Payer: Self-pay | Admitting: Cardiovascular Disease

## 2022-05-08 ENCOUNTER — Ambulatory Visit (INDEPENDENT_AMBULATORY_CARE_PROVIDER_SITE_OTHER): Payer: Medicare Other

## 2022-05-08 DIAGNOSIS — N3000 Acute cystitis without hematuria: Secondary | ICD-10-CM | POA: Diagnosis not present

## 2022-05-08 DIAGNOSIS — I10 Essential (primary) hypertension: Secondary | ICD-10-CM | POA: Diagnosis not present

## 2022-05-08 MED ORDER — NITROFURANTOIN MONOHYD MACRO 100 MG PO CAPS: 100.0000 mg | ORAL_CAPSULE | Freq: Two times a day (BID) | ORAL | 0 refills | Status: AC

## 2022-05-08 NOTE — Telephone Encounter (Signed)
Return pt's call. States she has an UTI; she has been treating it at home for 12 days and it is slightly better. States she had an UIT abut a year ago. She does not like taking abx but  would like her doctor to prescribe an abx since it has been 12 days. States she does not have a car and is unable to come to the office for an urine specimen. [facility] in [place]. Thanks

## 2022-05-08 NOTE — Telephone Encounter (Signed)
Pt reporting a burning sensation with urinating.  Pt urinating a lot and burns mostly in the morning.  Pt states she has been feeling like this for 12 days ad has been treating it herself.  Please call  back.

## 2022-05-08 NOTE — Assessment & Plan Note (Signed)
Her BMP at the last visit showed mild hyperkalemia at 5.3.  Instructed her to hold lisinopril for now until her cardiology visit which she says is in about a month.

## 2022-05-08 NOTE — Progress Notes (Signed)
  [facility] Internal Medicine Residency Telephone Encounter Continuity Care Appointment  HPI:  This telephone encounter was created for [name] on 05/08/2022 for the following purpose/cc UTI.    Past Medical History:  Past Medical History:  Diagnosis Date   CAD (coronary artery disease)    Chronic diastolic heart failure (HCC) 01/20/2016   Grade I by Echo 01/19/2016   Chronic urticaria 03/16/2013   Essential hypertension 08/14/2006   Gallstones    Gastroesophageal reflux disease 07/03/2013   Symptoms are not significant enough that patient wants pharmacologic therapy at this point    Hyperlipidemia LDL goal < 130 07/03/2013   Hypothyroidism 08/14/2006   Overweight (BMI 25.0-29.9) 07/03/2013     ROS:  Denies fevers, flank pain, nausea vomiting, suprapubic pain, hematuria.   Assessment / Plan / Recommendations:  Please see A&P under problem oriented charting for assessment of the patient's acute and chronic medical conditions.  As always, pt is advised that if symptoms worsen or new symptoms arise, they should go to an urgent care facility or to to ER for further evaluation.  UTI: Endorsing dysuria and increased urinary frequemcy for past 12 days. No suprapubic pain or hematuria. She says her symptoms are improving with conservative measures, and she is primarily contacting the clinic because she was instructed to contact her doctor by another healthcare provider.  She says she is unable to come in to provide a urine sample. - Sent 5-day course of Macrobid 100 mg twice daily to her pharmacy and instructed her to follow-up if her symptoms do not improve.  HTN: Her BMP at the last visit showed mild hyperkalemia at 5.3.  Instructed her to hold lisinopril for now until her cardiology visit which she says is in about a month.   Consent and Medical Decision Making:  Patient seen with Dr. Criselda Peaches This is a telephone encounter between Vickie [name] and Lyndle Herrlich  on 05/08/2022 for UTI. The visit was conducted with the patient located at home and Lyndle Herrlich at [facility]. The patient's identity was confirmed using their DOB and current address. The patient has consented to being evaluated through a telephone encounter and understands the associated risks (an examination cannot be done and the patient may need to come in for an appointment) / benefits (allows the patient to remain at home, decreasing exposure to coronavirus). I personally spent 15 minutes on medical discussion.

## 2022-05-08 NOTE — Telephone Encounter (Signed)
RTC to patient states has had the burning on urination for about 12 days .  Unable to come in for an appointment given a TeleHealth appointment for today.

## 2022-05-08 NOTE — Telephone Encounter (Signed)
Pt is requesting to switch from Dr. Duke Salvia to Dr. Tomie China so she can go to the [facility] office.

## 2022-05-08 NOTE — Assessment & Plan Note (Addendum)
Endorsing dysuria and increased urinary frequemcy for past 12 days. No suprapubic pain or hematuria. She says her symptoms are improving with conservative measures, and she is primarily contacting the clinic because she was instructed to contact her doctor by another healthcare provider.  She says she is unable to come in to provide a urine sample. - Sent 5-day course of Macrobid 100 mg twice daily to her pharmacy and instructed her to follow-up if her symptoms do not improve.

## 2022-05-10 NOTE — Progress Notes (Signed)
Internal Medicine Clinic Attending  Case discussed with Dr. Sridharan  at the time of the visit.  We reviewed the resident's history and pertinent patient test results.  I agree with the assessment, diagnosis, and plan of care documented in the resident's note.  

## 2022-06-21 DIAGNOSIS — K8020 Calculus of gallbladder without cholecystitis without obstruction: Secondary | ICD-10-CM | POA: Insufficient documentation

## 2022-06-21 DIAGNOSIS — I2510 Atherosclerotic heart disease of native coronary artery without angina pectoris: Secondary | ICD-10-CM | POA: Insufficient documentation

## 2022-06-27 ENCOUNTER — Telehealth: Payer: Self-pay | Admitting: Internal Medicine

## 2022-06-27 NOTE — Telephone Encounter (Signed)
Pt requesting a call back.  Pt states she does not have a car and can't come in at this time.  Pt states she is still having a UTI problem.  Pt thinks it did not go away and still has frequency, burning flare up and keeps her up at night.

## 2022-07-01 NOTE — Telephone Encounter (Signed)
I cannot continue to treat a UTI without more data over the phone.  She will need to present to UC close to her home if she cannot come in to the clinic.

## 2022-07-04 ENCOUNTER — Encounter: Payer: Self-pay | Admitting: Cardiology

## 2022-07-04 ENCOUNTER — Ambulatory Visit: Payer: Medicare Other | Attending: Cardiology | Admitting: Cardiology

## 2022-07-04 VITALS — BP 168/86 | HR 73 | Ht 60.0 in | Wt 151.0 lb

## 2022-07-04 DIAGNOSIS — I10 Essential (primary) hypertension: Secondary | ICD-10-CM | POA: Diagnosis not present

## 2022-07-04 DIAGNOSIS — E782 Mixed hyperlipidemia: Secondary | ICD-10-CM | POA: Insufficient documentation

## 2022-07-04 DIAGNOSIS — I2510 Atherosclerotic heart disease of native coronary artery without angina pectoris: Secondary | ICD-10-CM | POA: Diagnosis not present

## 2022-07-04 MED ORDER — AMLODIPINE BESYLATE 2.5 MG PO TABS: 2.5000 mg | ORAL_TABLET | Freq: Every day | ORAL | 3 refills | Status: DC

## 2022-07-04 NOTE — Progress Notes (Signed)
Cardiology Office Note:    Date:  07/04/2022   ID:  [name], DOB [birth year], MRN [id number]  PCP:  Inez Catalina, MD  Cardiologist:  Garwin Brothers, MD   Referring MD: Inez Catalina, MD    ASSESSMENT:    1. Coronary artery disease involving native coronary artery of native heart without angina pectoris   2. Essential hypertension   3. Mixed hyperlipidemia    PLAN:    In order of problems listed above:  Coronary artery disease: Secondary prevention stressed with the patient.  Importance of compliance with diet medication stressed when she vocalized understanding.  She was advised to walk at least half an hour a day on a daily basis. Essential hypertension: Blood pressure is elevated.  I added 2.5 mg of amlodipine to her regimen.  She tells me that her blood pressure is elevated at home.  She will keep a track of her blood pressures and bring [place] a log in 2 weeks. Mixed dyslipidemia: Her lipids are markedly elevated.  She is not taking her PCSK9 medications and we will refer her to the lipid clinic.  She had issues with insurance and pharmacies.  Diet was emphasized. Obesity: Weight reduction stressed and she promises to do better. Patient will be seen in follow-up appointment in 9 months or earlier if the patient has any concerns    Medication Adjustments/Labs and Tests Ordered: Current medicines are reviewed at length with the patient today.  Concerns regarding medicines are outlined above.  No orders of the defined types were placed in this encounter.  No orders of the defined types were placed in this encounter.    No chief complaint on file.    History of Present Illness:    Vickie [name] is a 81 y.o. female.  Patient has past medical history of coronary artery disease, essential hypertension, dyslipidemia.  She denies any problems at this time and takes care of activities of daily living.  No chest pain orthopnea or PND.  She ambulates on a regular  basis.  At the time of my evaluation, the patient is alert awake oriented and in no distress.  She mentions to me that her ACE inhibitor was discontinued because of hyperkalemia.  Past Medical History:  Diagnosis Date   CAD (coronary artery disease)    CAD in native artery 01/07/2016   Successful percutaneous coronary intervention to the 90% ostial/proximal LAD stenosis utilizing Angiosculpt scoring balloon, and ultimate insertion of a 2.2512 mm Resolute DES stent postdilated to 2.5 mm with a 90% stenosis being reduced to 0% on 01/06/2016   Chronic constipation 06/09/2018   Chronic diastolic heart failure (HCC) 01/20/2016   Grade I by Echo 01/19/2016   Chronic urticaria 03/16/2013   Diastolic dysfunction without heart failure 01/20/2016   Grade I by Echo 01/19/2016   Essential hypertension 08/14/2006   Gallstones    Gastroesophageal reflux disease 07/03/2013   Symptoms are not significant enough that patient wants pharmacologic therapy at this point    Healthcare maintenance 07/03/2013   Hyperlipidemia 07/03/2013   Hyponatremia 06/22/2020   Hypothyroidism 08/14/2006   Overweight (BMI 25.0-29.9) 07/03/2013   UTI (urinary tract infection) 08/01/2020    Past Surgical History:  Procedure Laterality Date   CARDIAC CATHETERIZATION N/A 01/06/2016   Procedure: Left Heart Cath and Coronary Angiography;  Surgeon: Thurmon Fair, MD;  Location: [facility] INVASIVE CV LAB;  Service: Cardiovascular;  Laterality: N/A;   CARDIAC CATHETERIZATION N/A 01/06/2016   Procedure: Coronary Stent  Intervention;  Surgeon: Lennette Bihari, MD;  Location: [facility] INVASIVE CV LAB;  Service: Cardiovascular;  Laterality: N/A;   CHOLECYSTECTOMY     TUBAL LIGATION      Current Medications: Current Meds  Medication Sig   aspirin 81 MG tablet Take 1 tablet (81 mg total) by mouth daily.   famotidine (PEPCID) 20 MG tablet TAKE 1 TABLET BY MOUTH TWICE A DAY   ibuprofen (ADVIL) 200 MG tablet Take 200-400 mg by mouth every 6 (six) hours as  needed for mild pain (or discomfort).   isosorbide mononitrate (IMDUR) 60 MG 24 hr tablet Take 1 tablet (60 mg total) by mouth daily.   levothyroxine (SYNTHROID) 88 MCG tablet Take 1 tablet (88 mcg total) by mouth daily before breakfast.   nebivolol (BYSTOLIC) 10 MG tablet Take 1 tablet (10 mg total) by mouth daily.   nitroGLYCERIN (NITROSTAT) 0.4 MG SL tablet Place 1 tablet (0.4 mg total) under the tongue every 5 (five) minutes as needed for chest pain.     Allergies:   Atorvastatin and Rosuvastatin   Social History   Socioeconomic History   Marital status: Divorced    Spouse name: Not on file   Number of children: 3   Years of education: Not on file   Highest education level: Not on file  Occupational History   Occupation: Retired   Tobacco Use   Smoking status: Never   Smokeless tobacco: Never  Vaping Use   Vaping Use: Never used  Substance and Sexual Activity   Alcohol use: Yes    Comment: Rarely.   Drug use: No   Sexual activity: Never  Other Topics Concern   Not on file  Social History Narrative   Current Social History 10/04/2020        Patient lives with family (younger son, 2 granddaughters, and grandson) in a suite over the garage which is 2 story. There are steps up to the entrance the patient uses the steps are a lift chair.       Patient's method of transportation is via family member.      The highest level of education was some college.      The patient currently retired.      Identified important Relationships are Sons, family       Pets : a dog she takes on walks       Interests / Fun: gardening       Current Stressors: transportation issues       Religious / Personal Beliefs: Christian       Other:     Social Determinants of Health   Financial Resource Strain: Low Risk  (07/13/2020)   Overall Financial Resource Strain (CARDIA)    Difficulty of Paying Living Expenses: Not hard at all  Food Insecurity: No Food Insecurity (07/13/2020)   Hunger  Vital Sign    Worried About Running Out of Food in the Last Year: Never true    Ran Out of Food in the Last Year: Never true  Transportation Needs: Unmet Transportation Needs (07/13/2020)   PRAPARE - Administrator, Civil Service (Medical): Yes    Lack of Transportation (Non-Medical): Yes  Physical Activity: Not on file  Stress: Not on file  Social Connections: Unknown (07/13/2020)   Social Connection and Isolation Panel [NHANES]    Frequency of Communication with Friends and Family: More than three times a week    Frequency of Social Gatherings with Friends and Family: Not on  file    Attends Religious Services: Not on file    Active Member of Clubs or Organizations: Not on file    Attends Banker Meetings: Not on file    Marital Status: Divorced     Family History: The patient's family history includes Bladder Cancer in her maternal aunt and son; Breast cancer in her sister; Diabetes in her son; Healthy in her brother, sister, sister, and son; Heart disease in her mother; Lung cancer in her sister; Unexplained death in her father. There is no history of Colon cancer or Esophageal cancer.  ROS:   Please see the history of present illness.    All other systems reviewed and are negative.  EKGs/Labs/Other Studies Reviewed:    The following studies were reviewed today: EKG reveals sinus and poor anterior forces.  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   Recent Labs: 03/23/2022: BUN 13; Creatinine, Ser 0.84; Potassium 5.3; Sodium 140; TSH 0.768  Recent Lipid Panel    Component Value Date/Time   CHOL 204 (H) 03/23/2022 1032   TRIG 159 (H) 03/23/2022 1032   HDL 35 (L) 03/23/2022 1032   CHOLHDL 5.8 (H) 03/23/2022 1032   CHOLHDL 2.8 08/16/2016 0859   VLDL 21 08/16/2016 0859   LDLCALC 140 (H) 03/23/2022 1032    Physical Exam:    VS:  BP (!) 168/86   Pulse 73   Ht 5' (1.524 m)   Wt 151 lb (68.5 kg)   LMP 10/20/1991   SpO2 95%    BMI 29.49 kg/m     Wt Readings from Last 3 Encounters:  07/04/22 151 lb (68.5 kg)  03/23/22 151 lb 14.4 oz (68.9 kg)  06/21/21 148 lb (67.1 kg)     GEN: Patient is in no acute distress HEENT: Normal NECK: No JVD; No carotid bruits LYMPHATICS: No lymphadenopathy CARDIAC: Hear sounds regular, 2/6 systolic murmur at the apex. RESPIRATORY:  Clear to auscultation without rales, wheezing or rhonchi  ABDOMEN: Soft, non-tender, non-distended MUSCULOSKELETAL:  No edema; No deformity  SKIN: Warm and dry NEUROLOGIC:  Alert and oriented x 3 PSYCHIATRIC:  Normal affect   Signed, Garwin Brothers, MD  07/04/2022 11:14 AM    [facility]

## 2022-07-04 NOTE — Patient Instructions (Signed)
Medication Instructions:  Your physician has recommended you make the following change in your medication:   Start 2.5 mg Norvasc (Amlodipine) daily. Keep a BP log and mail in 2 weeks.  *If you need a refill on your cardiac medications before your next appointment, please call your pharmacy*   Lab Work: None ordered If you have labs (blood work) drawn today and your tests are completely normal, you will receive your results only by: MyChart Message (if you have MyChart) OR A paper copy in the mail If you have any lab test that is abnormal or we need to change your treatment, we will call you to review the results.   Testing/Procedures: Your physician has requested that you have an echocardiogram. Echocardiography is a painless test that uses sound waves to create images of your heart. It provides your doctor with information about the size and shape of your heart and how well your heart's chambers and valves are working. This procedure takes approximately one hour. There are no restrictions for this procedure.    Follow-Up: At [facility], you and your health needs are our priority.  As part of our continuing mission to provide you with exceptional heart care, we have created designated Provider Care Teams.  These Care Teams include your primary Cardiologist (physician) and Advanced Practice Providers (APPs -  Physician Assistants and Nurse Practitioners) who all work together to provide you with the care you need, when you need it.  We recommend signing up for the patient portal called "MyChart".  Sign up information is provided on this After Visit Summary.  MyChart is used to connect with patients for Virtual Visits (Telemedicine).  Patients are able to view lab/test results, encounter notes, upcoming appointments, etc.  Non-urgent messages can be sent to your provider as well.   To learn more about what you can do with MyChart, go to [email or web address].    Your next appointment:    9 month(s)  The format for your next appointment:   In Person  Provider:   Belva Crome, MD   Other Instructions Echocardiogram An echocardiogram is a test that uses sound waves (ultrasound) to produce images of the heart. Images from an echocardiogram can provide important information about: Heart size and shape. The size and thickness and movement of your heart's walls. Heart muscle function and strength. Heart valve function or if you have stenosis. Stenosis is when the heart valves are too narrow. If blood is flowing backward through the heart valves (regurgitation). A tumor or infectious growth around the heart valves. Areas of heart muscle that are not working well because of poor blood flow or injury from a heart attack. Aneurysm detection. An aneurysm is a weak or damaged part of an artery wall. The wall bulges out from the normal force of blood pumping through the body. Tell a health care provider about: Any allergies you have. All medicines you are taking, including vitamins, herbs, eye drops, creams, and over-the-counter medicines. Any blood disorders you have. Any surgeries you have had. Any medical conditions you have. Whether you are pregnant or may be pregnant. What are the risks? Generally, this is a safe test. However, problems may occur, including an allergic reaction to dye (contrast) that may be used during the test. What happens before the test? No specific preparation is needed. You may eat and drink normally. What happens during the test? You will take off your clothes from the waist up and put on a hospital gown. Electrodes or  electrocardiogram (ECG)patches may be placed on your chest. The electrodes or patches are then connected to a device that monitors your heart rate and rhythm. You will lie down on a table for an ultrasound exam. A gel will be applied to your chest to help sound waves pass through your skin. A handheld device, called a transducer,  will be pressed against your chest and moved over your heart. The transducer produces sound waves that travel to your heart and bounce back (or "echo" back) to the transducer. These sound waves will be captured in real-time and changed into images of your heart that can be viewed on a video monitor. The images will be recorded on a computer and reviewed by your health care provider. You may be asked to change positions or hold your breath for a short time. This makes it easier to get different views or better views of your heart. In some cases, you may receive contrast through an IV in one of your veins. This can improve the quality of the pictures from your heart. The procedure may vary among health care providers and hospitals.   What can I expect after the test? You may return to your normal, everyday life, including diet, activities, and medicines, unless your health care provider tells you not to do that. Follow these instructions at home: It is up to you to get the results of your test. Ask your health care provider, or the department that is doing the test, when your results will be ready. Keep all follow-up visits. This is important. Summary An echocardiogram is a test that uses sound waves (ultrasound) to produce images of the heart. Images from an echocardiogram can provide important information about the size and shape of your heart, heart muscle function, heart valve function, and other possible heart problems. You do not need to do anything to prepare before this test. You may eat and drink normally. After the echocardiogram is completed, you may return to your normal, everyday life, unless your health care provider tells you not to do that. This information is not intended to replace advice given to you by your health care provider. Make sure you discuss any questions you have with your health care provider. Document Revised: 04/05/2020 Document Reviewed: 04/05/2020 Elsevier Patient  Education  2021 [employer].

## 2022-08-06 ENCOUNTER — Telehealth: Payer: Self-pay | Admitting: Cardiology

## 2022-08-06 NOTE — Telephone Encounter (Signed)
08/06/22 Attempted to contact patient to schedule appt with Lipid clinic, Pharm D, per Melissa okay for appt to be a virtual visit, VM box not set up. yo

## 2022-08-06 NOTE — Telephone Encounter (Signed)
-----   Message from Olene Floss, RPH-CPP sent at 07/09/2022  3:52 PM EST ----- Regarding: RE: virtual appt Yes that is fine. Please schedule as a telephone call ----- Message ----- From: Lorie Phenix Sent: 07/06/2022  11:08 AM EST To: Cv Div Pharmd Subject: virtual appt                                   Dr. Tomie China referred this patient to the lipid clinic.  He wanted to know if appt can be done virtually as patient does not drive.  Please advise.    Thanks

## 2022-10-01 ENCOUNTER — Telehealth: Payer: Self-pay | Admitting: Cardiology

## 2022-10-01 NOTE — Telephone Encounter (Signed)
Patient calling in to schd her echo but a new order needs to be put in. Please advise

## 2022-10-01 NOTE — Telephone Encounter (Signed)
No VM  

## 2022-10-03 NOTE — Telephone Encounter (Signed)
unable to leave message/no vm/kbl 10/03/22

## 2022-10-05 NOTE — Telephone Encounter (Signed)
unable to leave message/no vm/kbl 10/05/22

## 2023-02-05 ENCOUNTER — Telehealth: Payer: Self-pay | Admitting: Cardiology

## 2023-02-05 ENCOUNTER — Telehealth: Payer: Self-pay

## 2023-02-05 NOTE — Telephone Encounter (Signed)
Provided pt with physician line to help her find a PCP

## 2023-02-05 NOTE — Telephone Encounter (Signed)
Patient icalled to talk with Dr. Tomie China or nurse.....she says it is very important that someone gives her a call back. Would not give any details

## 2023-02-05 NOTE — Telephone Encounter (Signed)
Call routed to [facility]- Patient is seen by Dr. Tomie China in [facility] Forwarding call to [facility] triage.

## 2023-03-18 ENCOUNTER — Other Ambulatory Visit: Payer: Self-pay | Admitting: Internal Medicine

## 2023-03-18 DIAGNOSIS — I209 Angina pectoris, unspecified: Secondary | ICD-10-CM

## 2023-04-17 ENCOUNTER — Ambulatory Visit: Payer: 59

## 2023-04-17 VITALS — BP 145/72 | Ht 60.0 in | Wt 140.0 lb

## 2023-04-17 DIAGNOSIS — Z0000 Encounter for general adult medical examination without abnormal findings: Secondary | ICD-10-CM

## 2023-04-17 NOTE — Patient Instructions (Signed)
Vickie [name] , Thank you for taking time to come for your Medicare Wellness Visit. I appreciate your ongoing commitment to your health goals. Please review the following plan we discussed and let me know if I can assist you in the future.   Referrals/Orders/Follow-Ups/Clinician Recommendations: Keep up the good work.  Each day, aim for 6 glasses of water, plenty of protein in your diet and try to get up and walk/ stretch every hour for 5-10 minutes at a time.    This is a list of the screening recommended for you and due dates:  Health Maintenance  Topic Date Due   DTaP/Tdap/Td vaccine (1 - Tdap) Never done   Zoster (Shingles) Vaccine (1 of 2) Never done   Pneumonia Vaccine (1 of 1 - PCV) Never done   DEXA scan (bone density measurement)  Never done   COVID-19 Vaccine (1 - 2023-24 season) Never done   Flu Shot  03/28/2023   Medicare Annual Wellness Visit  04/16/2024   HPV Vaccine  Aged Out   Hepatitis C Screening  Discontinued    Advanced directives: (Copy Requested) Please bring a copy of your health care power of attorney and living will to the office to be added to your chart at your convenience.  Next Medicare Annual Wellness Visit scheduled for next year: No

## 2023-04-17 NOTE — Progress Notes (Signed)
Subjective:   Vickie [name] is a 82 y.o. female who presents for Medicare Annual (Subsequent) preventive examination.  Visit Complete: Virtual  I connected with  Vickie [name] on 04/17/23 by a audio enabled telemedicine application and verified that I am speaking with the correct person using two identifiers.  Patient Location: Home  Provider Location: Home Office  I discussed the limitations of evaluation and management by telemedicine. The patient expressed understanding and agreed to proceed.  Vital Signs: Vital signs are patient reported.   Review of Systems    Cardiac Risk Factors include: advanced age (>34men, >64 women);hypertension;dyslipidemia;Other (see comment), Risk factor comments: CAD, Hypothyriodism     Objective:    Today's Vitals   04/17/23 1347  BP: (!) 145/72  Weight: 140 lb (63.5 kg)  Height: 5' (1.524 m)   Body mass index is 27.34 kg/m.     04/17/2023    2:04 PM 03/23/2022    9:23 AM 06/16/2021    9:26 AM 10/04/2020   10:09 AM 09/02/2020   10:52 AM 06/22/2020    5:17 AM 06/10/2020    9:16 AM  Advanced Directives  Does Patient Have a Medical Advance Directive? Yes No No No No No No  Type of Estate agent of [place];Living will        Copy of Healthcare Power of Attorney in Chart? No - copy requested        Would patient like information on creating a medical advance directive?  No - Patient declined No - Patient declined No - Patient declined No - Patient declined No - Patient declined No - Patient declined    Current Medications (verified) Outpatient Encounter Medications as of 04/17/2023  Medication Sig   Alirocumab (PRALUENT) 75 MG/ML SOAJ ADMINISTER 1 ML UNDER THE SKIN EVERY 14 DAYS   amLODipine (NORVASC) 2.5 MG tablet Take 1 tablet (2.5 mg total) by mouth daily.   aspirin 81 MG tablet Take 1 tablet (81 mg total) by mouth daily.   famotidine (PEPCID) 20 MG tablet TAKE 1 TABLET BY MOUTH TWICE A DAY   ibuprofen  (ADVIL) 200 MG tablet Take 200-400 mg by mouth every 6 (six) hours as needed for mild pain (or discomfort).   isosorbide mononitrate (IMDUR) 60 MG 24 hr tablet TAKE 1 TABLET(60 MG) BY MOUTH DAILY   levothyroxine (SYNTHROID) 88 MCG tablet Take 1 tablet (88 mcg total) by mouth daily before breakfast.   nebivolol (BYSTOLIC) 10 MG tablet Take 1 tablet (10 mg total) by mouth daily.   nitroGLYCERIN (NITROSTAT) 0.4 MG SL tablet Place 1 tablet (0.4 mg total) under the tongue every 5 (five) minutes as needed for chest pain.   lisinopril (ZESTRIL) 10 MG tablet Take 1 tablet (10 mg total) by mouth daily. (Patient not taking: Reported on 04/17/2023)   No facility-administered encounter medications on file as of 04/17/2023.    Allergies (verified) Atorvastatin and Rosuvastatin   History: Past Medical History:  Diagnosis Date   CAD (coronary artery disease)    CAD in native artery 01/07/2016   Successful percutaneous coronary intervention to the 90% ostial/proximal LAD stenosis utilizing Angiosculpt scoring balloon, and ultimate insertion of a 2.2512 mm Resolute DES stent postdilated to 2.5 mm with a 90% stenosis being reduced to 0% on 01/06/2016   Chronic constipation 06/09/2018   Chronic diastolic heart failure (HCC) 01/20/2016   Grade I by Echo 01/19/2016   Chronic urticaria 03/16/2013   Diastolic dysfunction without heart failure 01/20/2016  Grade I by Echo 01/19/2016   Essential hypertension 08/14/2006   Gallstones    Gastroesophageal reflux disease 07/03/2013   Symptoms are not significant enough that patient wants pharmacologic therapy at this point    Healthcare maintenance 07/03/2013   Hyperlipidemia 07/03/2013   Hyponatremia 06/22/2020   Hypothyroidism 08/14/2006   Overweight (BMI 25.0-29.9) 07/03/2013   UTI (urinary tract infection) 08/01/2020   Past Surgical History:  Procedure Laterality Date   CARDIAC CATHETERIZATION N/A 01/06/2016   Procedure: Left Heart Cath and Coronary Angiography;   Surgeon: Thurmon Fair, MD;  Location: [facility];  Service: Cardiovascular;  Laterality: N/A;   CARDIAC CATHETERIZATION N/A 01/06/2016   Procedure: Coronary Stent Intervention;  Surgeon: Lennette Bihari, MD;  Location: [facility];  Service: Cardiovascular;  Laterality: N/A;   CHOLECYSTECTOMY     TUBAL LIGATION     Family History  Problem Relation Age of Onset   Lung cancer Sister    Healthy Sister        Unknown health/Unknown health   Healthy Brother        Unknown health   Heart disease Mother    Unexplained death Father        Killed in World War II   Diabetes Son    Healthy Son    Bladder Cancer Son    Breast cancer Sister    Healthy Sister        Unknown health   Bladder Cancer Maternal Aunt    [name] cancer Neg Hx    Esophageal cancer Neg Hx    Social History   Socioeconomic History   Marital status: Divorced    Spouse name: Not on file   Number of children: 3   Years of education: Not on file   Highest education level: Not on file  Occupational History   Occupation: Retired   Tobacco Use   Smoking status: Never   Smokeless tobacco: Never  Vaping Use   Vaping status: Never Used  Substance and Sexual Activity   Alcohol use: Yes    Comment: Rarely.   Drug use: No   Sexual activity: Never  Other Topics Concern   Not on file  Social History Narrative   Current Social History 10/04/2020        Patient lives with family (younger son, 2 granddaughters, and grandson) in a suite over the garage which is 2 story. There are steps up to the entrance the patient uses the steps are a lift chair.       Patient's method of transportation is via family member.      The highest level of education was some college.      The patient currently retired.      Identified important Relationships are Sons, family       Pets : a dog she takes on walks       Interests / Fun: gardening       Current Stressors: transportation issues       Religious / Personal  Beliefs: Christian       Other:     Social Determinants of Health   Financial Resource Strain: Low Risk  (07/13/2020)   Overall Financial Resource Strain (CARDIA)    Difficulty of Paying Living Expenses: Not hard at all  Food Insecurity: No Food Insecurity (07/13/2020)   Hunger Vital Sign    Worried About Running Out of Food in the Last Year: Never true    Ran Out of  Food in the Last Year: Never true  Transportation Needs: Unmet Transportation Needs (07/13/2020)   PRAPARE - Administrator, Civil Service (Medical): Yes    Lack of Transportation (Non-Medical): Yes  Physical Activity: Not on file  Stress: Not on file  Social Connections: Unknown (07/13/2020)   Social Connection and Isolation Panel [NHANES]    Frequency of Communication with Friends and Family: More than three times a week    Frequency of Social Gatherings with Friends and Family: Not on file    Attends Religious Services: Not on Marketing executive or Organizations: Not on file    Attends Banker Meetings: Not on file    Marital Status: Divorced    Tobacco Counseling Counseling given: Not Answered   Clinical Intake:  Pre-visit preparation completed: Yes  Pain : No/denies pain     BMI - recorded: 27.34 Nutritional Status: BMI 25 -29 Overweight Nutritional Risks: None Diabetes: No  How often do you need to have someone help you when you read instructions, pamphlets, or other written materials from your doctor or pharmacy?: 1 - Never  Interpreter Needed?: No  Information entered by :: Porshia Blizzard, RMA   Activities of Daily Living    04/17/2023    1:50 PM  In your present state of health, do you have any difficulty performing the following activities:  Hearing? 1  Comment has hearing issues.  Vision? 0  Difficulty concentrating or making decisions? 0  Walking or climbing stairs? 0  Dressing or bathing? 0  Doing errands, shopping? 1  Comment her son drives  her around or family members.  Preparing Food and eating ? N  Using the Toilet? N  In the past six months, have you accidently leaked urine? N  Do you have problems with loss of bowel control? N  Managing your Medications? N  Managing your Finances? N  Housekeeping or managing your Housekeeping? N    Patient Care Team: Inez Catalina, MD as PCP - General (Internal Medicine)  Indicate any recent Medical Services you may have received from other than [facility] providers in the past year (date may be approximate).     Assessment:   This is a routine wellness examination for Vickie [name].  Hearing/Vision screen Hearing Screening - Comments:: Has some hearing issues. Vision Screening - Comments:: Wears eyeglasses.  Dietary issues and exercise activities discussed:     Goals Addressed               This Visit's Progress     Patient Stated (pt-stated)        Want to eat better.      Depression Screen    03/23/2022    9:23 AM 06/16/2021    9:25 AM 10/04/2020   10:22 AM 09/02/2020   10:55 AM 06/10/2020    9:51 AM 04/22/2019    8:31 AM 06/09/2018    9:52 AM  PHQ 2/9 Scores  PHQ - 2 Score 0 0 0 0 1 0 0  PHQ- 9 Score  0 0  3 1 0    Fall Risk    04/17/2023    2:04 PM 03/23/2022    9:22 AM 06/16/2021    9:25 AM 10/04/2020   10:07 AM 09/02/2020   10:52 AM  Fall Risk   Falls in the past year? 0 0 0 1 1  Number falls in past yr: 0 0 0 0 0  Injury with Fall? 0  0 0 0 0  Risk for fall due to : No Fall Risks No Fall Risks No Fall Risks Other (Comment) No Fall Risks  Risk for fall due to: Comment    illness - Covid   Follow up Falls prevention discussed;Falls evaluation completed Falls evaluation completed;Falls prevention discussed Falls evaluation completed;Falls prevention discussed Falls evaluation completed Falls prevention discussed    MEDICARE RISK AT HOME: Medicare Risk at Home Any stairs in or around the home?: Yes If so, are there any without handrails?: Yes Home free of  loose throw rugs in walkways, pet beds, electrical cords, etc?: Yes Adequate lighting in your home to reduce risk of falls?: Yes Life alert?: No Use of a cane, walker or w/c?: No Grab bars in the bathroom?: No Shower chair or bench in shower?: Yes Elevated toilet seat or a handicapped toilet?: No  TIMED UP AND GO:  Was the test performed?  No    Cognitive Function:        04/17/2023    2:05 PM  6CIT Screen  What Year? 0 points  What month? 0 points  What time? 0 points    Immunizations Immunization History  Administered Date(s) Administered   Influenza,inj,Quad PF,6+ Mos 06/29/2016, 07/05/2017, 06/09/2018    TDAP status: Due, Education has been provided regarding the importance of this vaccine. Advised may receive this vaccine at local pharmacy or Health Dept. Aware to provide a copy of the vaccination record if obtained from local pharmacy or Health Dept. Verbalized acceptance and understanding.  Flu Vaccine status: Declined, Education has been provided regarding the importance of this vaccine but patient still declined. Advised may receive this vaccine at local pharmacy or Health Dept. Aware to provide a copy of the vaccination record if obtained from local pharmacy or Health Dept. Verbalized acceptance and understanding.  Pneumococcal vaccine status: Declined,  Education has been provided regarding the importance of this vaccine but patient still declined. Advised may receive this vaccine at local pharmacy or Health Dept. Aware to provide a copy of the vaccination record if obtained from local pharmacy or Health Dept. Verbalized acceptance and understanding.   Covid-19 vaccine status: Declined, Education has been provided regarding the importance of this vaccine but patient still declined. Advised may receive this vaccine at local pharmacy or Health Dept.or vaccine clinic. Aware to provide a copy of the vaccination record if obtained from local pharmacy or Health Dept.  Verbalized acceptance and understanding.  Qualifies for Shingles Vaccine? Yes   Zostavax completed No   Shingrix Completed?: No.    Education has been provided regarding the importance of this vaccine. Patient has been advised to call insurance company to determine out of pocket expense if they have not yet received this vaccine. Advised may also receive vaccine at local pharmacy or Health Dept. Verbalized acceptance and understanding.  Screening Tests Health Maintenance  Topic Date Due   DTaP/Tdap/Td (1 - Tdap) Never done   Zoster Vaccines- Shingrix (1 of 2) Never done   Pneumonia Vaccine 39+ Years old (1 of 1 - PCV) Never done   DEXA SCAN  Never done   COVID-19 Vaccine (1 - 2023-24 season) Never done   INFLUENZA VACCINE  03/28/2023   Medicare Annual Wellness (AWV)  04/16/2024   HPV VACCINES  Aged Out   Hepatitis C Screening  Discontinued    Health Maintenance  Health Maintenance Due  Topic Date Due   DTaP/Tdap/Td (1 - Tdap) Never done   Zoster Vaccines- Shingrix (1 of  2) Never done   Pneumonia Vaccine 70+ Years old (1 of 1 - PCV) Never done   DEXA SCAN  Never done   COVID-19 Vaccine (1 - 2023-24 season) Never done   INFLUENZA VACCINE  03/28/2023    Colorectal cancer screening: No longer required.   Mammogram status: No longer required due to age.  Lung Cancer Screening: (Low Dose CT Chest recommended if Age 43-80 years, 20 pack-year currently smoking OR have quit w/in 15years.) does not qualify.   Lung Cancer Screening Referral: N/A  Additional Screening:  Hepatitis C Screening: does qualify; Completed 03/23/2022  Vision Screening: Recommended annual ophthalmology exams for early detection of glaucoma and other disorders of the eye. Is the patient up to date with their annual eye exam?  No  Who is the provider or what is the name of the office in which the patient attends annual eye exams? N/A If pt is not established with a provider, would they like to be referred to  a provider to establish care? No .   Dental Screening: Recommended annual dental exams for proper oral hygiene    Community Resource Referral / Chronic Care Management: CRR required this visit?  No   CCM required this visit?  No     Plan:     I have personally reviewed and noted the following in the patient's chart:   Medical and social history Use of alcohol, tobacco or illicit drugs  Current medications and supplements including opioid prescriptions. Patient is not currently taking opioid prescriptions. Functional ability and status Nutritional status Physical activity Advanced directives List of other physicians Hospitalizations, surgeries, and ER visits in previous 12 months Vitals Screenings to include cognitive, depression, and falls Referrals and appointments  In addition, I have reviewed and discussed with patient certain preventive protocols, quality metrics, and best practice recommendations. A written personalized care plan for preventive services as well as general preventive health recommendations were provided to patient.     Vickie [name] Vickie [name], CMA   04/17/2023   After Visit Summary: (Mail) Due to this being a telephonic visit, the after visit summary with patients personalized plan was offered to patient via mail   Nurse Notes: Patient is due for DEXA but declines.  She is also due for Tdap, Flu, Shingrix and Covid , however she declines all vaccines.  Patient stated that she will find an eye doctor on her own.  She would also like to transfer doctor offices to be closer to where she lives.  Patient has no other concerns at this time.  She will call office to schedule her next year's AWV.

## 2023-04-22 ENCOUNTER — Telehealth: Payer: Self-pay | Admitting: Cardiology

## 2023-04-22 DIAGNOSIS — I209 Angina pectoris, unspecified: Secondary | ICD-10-CM

## 2023-04-22 MED ORDER — ISOSORBIDE MONONITRATE ER 60 MG PO TB24
60.0000 mg | ORAL_TABLET | Freq: Every day | ORAL | 0 refills | Status: DC
Start: 2023-04-22 — End: 2023-07-19

## 2023-04-22 NOTE — Telephone Encounter (Signed)
RX sent

## 2023-04-22 NOTE — Telephone Encounter (Signed)
*  STAT* If patient is at the pharmacy, call can be transferred to refill team.   1. Which medications need to be refilled? (please list name of each medication and dose if known)   isosorbide mononitrate (IMDUR) 60 MG 24 hr tablet    2. Which pharmacy/location (including street and city if local pharmacy) is medication to be sent to?  [id number] - [place], [place] - [address]    3. Do they need a 30 day or 90 day supply? 90   Patient is stating she no longer has refills for this at her pharmacy

## 2023-05-08 ENCOUNTER — Encounter: Payer: 59 | Admitting: Internal Medicine

## 2023-06-14 ENCOUNTER — Other Ambulatory Visit: Payer: Self-pay | Admitting: Internal Medicine

## 2023-06-14 ENCOUNTER — Other Ambulatory Visit: Payer: Self-pay | Admitting: Cardiology

## 2023-06-14 DIAGNOSIS — I10 Essential (primary) hypertension: Secondary | ICD-10-CM

## 2023-06-16 ENCOUNTER — Other Ambulatory Visit: Payer: Self-pay | Admitting: Internal Medicine

## 2023-06-16 DIAGNOSIS — E038 Other specified hypothyroidism: Secondary | ICD-10-CM

## 2023-07-01 NOTE — Telephone Encounter (Signed)
Patient has a future appointment scheduled on 08/01/23 as new patient primary care located at [facility] at [facility], [place] with Dr. Seabron Spates.

## 2023-07-02 ENCOUNTER — Ambulatory Visit: Payer: 59 | Admitting: Family Medicine

## 2023-07-04 ENCOUNTER — Telehealth: Payer: Self-pay | Admitting: Cardiology

## 2023-07-04 NOTE — Telephone Encounter (Signed)
Unable to reach pt or leave a message mailbox is full 

## 2023-07-04 NOTE — Telephone Encounter (Signed)
  Pt said, she would like to schedule her echo hopefully before appt with Dr. Tomie China. Need order for echo

## 2023-07-05 NOTE — Telephone Encounter (Signed)
Recommendations reviewed with pt as per Dr. Revankar's note.  Pt verbalized understanding and had no additional questions.   

## 2023-07-15 ENCOUNTER — Encounter: Payer: Self-pay | Admitting: *Deleted

## 2023-07-16 ENCOUNTER — Ambulatory Visit: Payer: 59 | Attending: Cardiology | Admitting: Cardiology

## 2023-07-16 ENCOUNTER — Encounter: Payer: Self-pay | Admitting: Cardiology

## 2023-07-16 VITALS — BP 142/82 | HR 63 | Ht 60.0 in | Wt 136.0 lb

## 2023-07-16 DIAGNOSIS — I2510 Atherosclerotic heart disease of native coronary artery without angina pectoris: Secondary | ICD-10-CM | POA: Diagnosis not present

## 2023-07-16 DIAGNOSIS — E782 Mixed hyperlipidemia: Secondary | ICD-10-CM | POA: Diagnosis not present

## 2023-07-16 DIAGNOSIS — Z789 Other specified health status: Secondary | ICD-10-CM | POA: Insufficient documentation

## 2023-07-16 DIAGNOSIS — I10 Essential (primary) hypertension: Secondary | ICD-10-CM | POA: Diagnosis not present

## 2023-07-16 HISTORY — DX: Other specified health status: Z78.9

## 2023-07-16 NOTE — Patient Instructions (Signed)
Medication Instructions:   No changes were made to your medications.  *If you need a refill on your cardiac medications before your next appointment, please call your pharmacy*   Lab Work: None ordered   If you have labs (blood work) drawn today and your tests are completely normal, you will receive your results only by: MyChart Message (if you have MyChart) OR A paper copy in the mail If you have any lab test that is abnormal or we need to change your treatment, we will call you to review the results.   Testing/Procedures: None  Follow-Up: At [facility], you and your health needs are our priority.  As part of our continuing mission to provide you with exceptional heart care, we have created designated Provider Care Teams.  These Care Teams include your primary Cardiologist (physician) and Advanced Practice Providers (APPs -  Physician Assistants and Nurse Practitioners) who all work together to provide you with the care you need, when you need it.  We recommend signing up for the patient portal called "MyChart".  Sign up information is provided on this After Visit Summary.  MyChart is used to connect with patients for Virtual Visits (Telemedicine).  Patients are able to view lab/test results, encounter notes, upcoming appointments, etc.  Non-urgent messages can be sent to your provider as well.   To learn more about what you can do with MyChart, go to [email or web address].    Your next appointment:   12 month(s)  Provider:   Belva Crome, MD

## 2023-07-16 NOTE — Progress Notes (Signed)
Cardiology Office Note:    Date:  07/16/2023   ID:  [name], DOB 30-Sep-1940, MRN [id number]  PCP:  Miguel Aschoff, MD  Cardiologist:  Garwin Brothers, MD   Referring MD: Inez Catalina, MD    ASSESSMENT:    1. Coronary artery disease involving native coronary artery of native heart without angina pectoris   2. Essential hypertension   3. Mixed hyperlipidemia    PLAN:    In order of problems listed above:  Coronary artery disease: Secondary prevention stressed to the patient.  Importance of compliance with diet medication stressed and she vocalized understanding.  She was advised to walk half an hour on a daily basis and she promises to do so. Essential hypertension: Blood pressure stable and diet was emphasized.  She has an element of whitecoat hypertension.  She brought multiple blood pressure readings from home and they are fine. Mixed dyslipidemia: On lipid-lowering medications at 1 point but she is not taking it at this time she wants to get her blood work done by primary care.  She is very insistent about this.  She will send [place] the blood work numbers.  She is statin intolerant she has tried more than 2 statins.  Will get her in the lipid clinic for continuation of Praluent. Patient will be seen in follow-up appointment in 12 months or earlier if the patient has any concerns.    Medication Adjustments/Labs and Tests Ordered: Current medicines are reviewed at length with the patient today.  Concerns regarding medicines are outlined above.  Orders Placed This Encounter  Procedures   EKG 12-Lead   No orders of the defined types were placed in this encounter.    No chief complaint on file.    History of Present Illness:    Vickie [name] is a 82 y.o. female.  Patient has past medical history of coronary artery disease, essential hypertension and mixed dyslipidemia.  She denies any problems at this time and takes care of activities of daily living.   No chest pain orthopnea or PND.  At the time of my evaluation, the patient is alert awake oriented and in no distress.  She is active lady.  Past Medical History:  Diagnosis Date   CAD (coronary artery disease)    CAD in native artery 01/07/2016   Successful percutaneous coronary intervention to the 90% ostial/proximal LAD stenosis utilizing Angiosculpt scoring balloon, and ultimate insertion of a 2.2512 mm Resolute DES stent postdilated to 2.5 mm with a 90% stenosis being reduced to 0% on 01/06/2016   Chronic constipation 06/09/2018   Chronic diastolic heart failure (HCC) 01/20/2016   Grade I by Echo 01/19/2016   Chronic urticaria 03/16/2013   Diastolic dysfunction without heart failure 01/20/2016   Grade I by Echo 01/19/2016   Essential hypertension 08/14/2006   Gallstones    Gastroesophageal reflux disease 07/03/2013   Symptoms are not significant enough that patient wants pharmacologic therapy at this point    Healthcare maintenance 07/03/2013   Hyperlipidemia 07/03/2013   Hyponatremia 06/22/2020   Hypothyroidism 08/14/2006   Overweight (BMI 25.0-29.9) 07/03/2013   UTI (urinary tract infection) 08/01/2020    Past Surgical History:  Procedure Laterality Date   CARDIAC CATHETERIZATION N/A 01/06/2016   Procedure: Left Heart Cath and Coronary Angiography;  Surgeon: Thurmon Fair, MD;  Location: [facility];  Service: Cardiovascular;  Laterality: N/A;   CARDIAC CATHETERIZATION N/A 01/06/2016   Procedure: Coronary Stent Intervention;  Surgeon: Lennette Bihari,  MD;  Location: [facility];  Service: Cardiovascular;  Laterality: N/A;   CHOLECYSTECTOMY     TUBAL LIGATION      Current Medications: Current Meds  Medication Sig   amLODipine (NORVASC) 2.5 MG tablet Take 1 tablet (2.5 mg total) by mouth daily.   aspirin 81 MG tablet Take 1 tablet (81 mg total) by mouth daily.   ibuprofen (ADVIL) 200 MG tablet Take 200-400 mg by mouth every 6 (six) hours as needed for mild pain (or  discomfort).   isosorbide mononitrate (IMDUR) 60 MG 24 hr tablet Take 1 tablet (60 mg total) by mouth daily.   levothyroxine (SYNTHROID) 88 MCG tablet TAKE 1 TABLET(88 MCG) BY MOUTH DAILY BEFORE BREAKFAST   nebivolol (BYSTOLIC) 10 MG tablet TAKE 1 [id number](14 MG) BY MOUTH DAILY   nitroGLYCERIN (NITROSTAT) 0.4 MG SL tablet Place 1 tablet (0.4 mg total) under the tongue every 5 (five) minutes as needed for chest pain.     Allergies:   Atorvastatin and Rosuvastatin   Social History   Socioeconomic History   Marital status: Divorced    Spouse name: Not on file   Number of children: 3   Years of education: Not on file   Highest education level: Not on file  Occupational History   Occupation: Retired   Tobacco Use   Smoking status: Never   Smokeless tobacco: Never  Vaping Use   Vaping status: Never Used  Substance and Sexual Activity   Alcohol use: Yes    Comment: Rarely.   Drug use: No   Sexual activity: Never  Other Topics Concern   Not on file  Social History Narrative   Current Social History 10/04/2020        Patient lives with family (younger son, 2 granddaughters, and grandson) in a suite over the garage which is 2 story. There are steps up to the entrance the patient uses the steps are a lift chair.       Patient's method of transportation is via family member.      The highest level of education was some college.      The patient currently retired.      Identified important Relationships are Sons, family       Pets : a dog she takes on walks       Interests / Fun: gardening       Current Stressors: transportation issues       Religious / Personal Beliefs: Christian       Other:     Social Determinants of Health   Financial Resource Strain: Low Risk  (04/17/2023)   Overall Financial Resource Strain (CARDIA)    Difficulty of Paying Living Expenses: Not hard at all  Food Insecurity: No Food Insecurity (04/17/2023)   Hunger Vital Sign    Worried About Running Out  of Food in the Last Year: Never true    Ran Out of Food in the Last Year: Never true  Transportation Needs: No Transportation Needs (04/17/2023)   PRAPARE - Administrator, Civil Service (Medical): No    Lack of Transportation (Non-Medical): No  Physical Activity: Insufficiently Active (04/17/2023)   Exercise Vital Sign    Days of Exercise per Week: 7 days    Minutes of Exercise per Session: 20 min  Stress: No Stress Concern Present (04/17/2023)   [employer] of Occupational Health - Occupational Stress Questionnaire    Feeling of Stress : Not at all  Social Connections:  Socially Isolated (04/17/2023)   Social Connection and Isolation Panel [NHANES]    Frequency of Communication with Friends and Family: Never    Frequency of Social Gatherings with Friends and Family: More than three times a week    Attends Religious Services: Never    Database administrator or Organizations: No    Attends Engineer, structural: Never    Marital Status: Divorced     Family History: The patient's family history includes Bladder Cancer in her maternal aunt and son; Breast cancer in her sister; Diabetes in her son; Healthy in her brother, sister, sister, and son; Heart disease in her mother; Lung cancer in her sister; Unexplained death in her father. There is no history of Colon cancer or Esophageal cancer.  ROS:   Please see the history of present illness.    All other systems reviewed and are negative.  EKGs/Labs/Other Studies Reviewed:    The following studies were reviewed today: .Marland KitchenEKG Interpretation Date/Time:  Tuesday July 16 2023 09:21:06 EST Ventricular Rate:  63 PR Interval:  162 QRS Duration:  82 QT Interval:  402 QTC Calculation: 411 R Axis:   -21  Text Interpretation: Normal sinus rhythm Possible Anterior infarct (cited on or before 28-Jun-2020) When compared with ECG of 28-Jun-2020 12:27, No significant change was found Confirmed by Belva Crome [id number])  on 07/16/2023 9:55:58 AM     Recent Labs: No results found for requested labs within last 365 days.  Recent Lipid Panel    Component Value Date/Time   CHOL 204 (H) 03/23/2022 1032   TRIG 159 (H) 03/23/2022 1032   HDL 35 (L) 03/23/2022 1032   CHOLHDL 5.8 (H) 03/23/2022 1032   CHOLHDL 2.8 08/16/2016 0859   VLDL 21 08/16/2016 0859   LDLCALC 140 (H) 03/23/2022 1032    Physical Exam:    VS:  BP (!) 148/84   Pulse 63   Ht 5' (1.524 m)   Wt 136 lb 0.6 oz (61.7 kg)   LMP 10/20/1991   SpO2 96%   BMI 26.57 kg/m     Wt Readings from Last 3 Encounters:  07/16/23 136 lb 0.6 oz (61.7 kg)  04/17/23 140 lb (63.5 kg)  07/04/22 151 lb (68.5 kg)     GEN: Patient is in no acute distress HEENT: Normal NECK: No JVD; No carotid bruits LYMPHATICS: No lymphadenopathy CARDIAC: Hear sounds regular, 2/6 systolic murmur at the apex. RESPIRATORY:  Clear to auscultation without rales, wheezing or rhonchi  ABDOMEN: Soft, non-tender, non-distended MUSCULOSKELETAL:  No edema; No deformity  SKIN: Warm and dry NEUROLOGIC:  Alert and oriented x 3 PSYCHIATRIC:  Normal affect   Signed, Garwin Brothers, MD  07/16/2023 10:12 AM    [facility]

## 2023-07-19 ENCOUNTER — Telehealth: Payer: Self-pay | Admitting: Cardiology

## 2023-07-19 ENCOUNTER — Other Ambulatory Visit: Payer: Self-pay

## 2023-07-19 DIAGNOSIS — I209 Angina pectoris, unspecified: Secondary | ICD-10-CM

## 2023-07-19 DIAGNOSIS — I10 Essential (primary) hypertension: Secondary | ICD-10-CM

## 2023-07-19 MED ORDER — ISOSORBIDE MONONITRATE ER 60 MG PO TB24
60.0000 mg | ORAL_TABLET | Freq: Every day | ORAL | 3 refills | Status: DC
Start: 2023-07-19 — End: 2023-07-19

## 2023-07-19 MED ORDER — NITROGLYCERIN 0.4 MG SL SUBL: 0.4000 mg | SUBLINGUAL_TABLET | SUBLINGUAL | 11 refills | Status: DC | PRN

## 2023-07-19 MED ORDER — AMLODIPINE BESYLATE 2.5 MG PO TABS
2.5000 mg | ORAL_TABLET | Freq: Every day | ORAL | 3 refills | Status: DC
Start: 2023-07-19 — End: 2023-07-19

## 2023-07-19 MED ORDER — ISOSORBIDE MONONITRATE ER 60 MG PO TB24: 60.0000 mg | ORAL_TABLET | Freq: Every day | ORAL | 3 refills | Status: DC

## 2023-07-19 MED ORDER — NITROGLYCERIN 0.4 MG SL SUBL: 0.4000 mg | SUBLINGUAL_TABLET | SUBLINGUAL | 1 refills | Status: DC | PRN

## 2023-07-19 MED ORDER — AMLODIPINE BESYLATE 2.5 MG PO TABS
2.5000 mg | ORAL_TABLET | Freq: Every day | ORAL | 3 refills | Status: DC
Start: 2023-07-19 — End: 2024-07-22

## 2023-07-19 NOTE — Telephone Encounter (Signed)
Attempted to call patient. Patient did not answer the phone and voice mail not set-up so unable to leave a message.

## 2023-07-19 NOTE — Telephone Encounter (Signed)
*  STAT* If patient is at the pharmacy, call can be transferred to refill team.   1. Which medications need to be refilled? (please list name of each medication and dose if known)  amLODipine (NORVASC) 2.5 MG tablet  isosorbide mononitrate (IMDUR) 60 MG 24 hr tablet  nitroGLYCERIN (NITROSTAT) 0.4 MG SL tablet  2. Which pharmacy/location (including street and city if local pharmacy) is medication to be sent to? [id number] - [place], [place] - [address] AT [facility] OF [address] Phone: [phone]  Fax: [phone]     3. Do they need a 30 day or 90 day supply? 90 30 on nitro

## 2023-07-19 NOTE — Telephone Encounter (Signed)
Isosorbide, Amlodipine and Nitroglycerin refilled 90 day ref x 3. Sent to [facility]

## 2023-08-01 ENCOUNTER — Ambulatory Visit: Payer: 59 | Admitting: Family Medicine

## 2023-08-01 ENCOUNTER — Encounter: Payer: Self-pay | Admitting: Family Medicine

## 2023-08-01 VITALS — BP 136/80 | HR 70 | Temp 97.7°F | Resp 18 | Ht 60.0 in | Wt 137.0 lb

## 2023-08-01 DIAGNOSIS — I10 Essential (primary) hypertension: Secondary | ICD-10-CM

## 2023-08-01 DIAGNOSIS — I700 Atherosclerosis of aorta: Secondary | ICD-10-CM

## 2023-08-01 DIAGNOSIS — I5189 Other ill-defined heart diseases: Secondary | ICD-10-CM

## 2023-08-01 DIAGNOSIS — Z0000 Encounter for general adult medical examination without abnormal findings: Secondary | ICD-10-CM

## 2023-08-01 DIAGNOSIS — E038 Other specified hypothyroidism: Secondary | ICD-10-CM | POA: Diagnosis not present

## 2023-08-01 DIAGNOSIS — E785 Hyperlipidemia, unspecified: Secondary | ICD-10-CM | POA: Diagnosis not present

## 2023-08-01 DIAGNOSIS — I2510 Atherosclerotic heart disease of native coronary artery without angina pectoris: Secondary | ICD-10-CM | POA: Diagnosis not present

## 2023-08-01 LAB — CBC WITH DIFFERENTIAL/PLATELET
Basophils Absolute: 0.1 10*3/uL (ref 0.0–0.1)
Basophils Relative: 0.8 % (ref 0.0–3.0)
Eosinophils Absolute: 0.3 10*3/uL (ref 0.0–0.7)
Eosinophils Relative: 3 % (ref 0.0–5.0)
HCT: 44.6 % (ref 36.0–46.0)
Hemoglobin: 14.9 g/dL (ref 12.0–15.0)
Lymphocytes Relative: 43.4 % (ref 12.0–46.0)
Lymphs Abs: 3.9 10*3/uL (ref 0.7–4.0)
MCHC: 33.3 g/dL (ref 30.0–36.0)
MCV: 94.3 fL (ref 78.0–100.0)
Monocytes Absolute: 0.6 10*3/uL (ref 0.1–1.0)
Monocytes Relative: 6.2 % (ref 3.0–12.0)
Neutro Abs: 4.2 10*3/uL (ref 1.4–7.7)
Neutrophils Relative %: 46.6 % (ref 43.0–77.0)
Platelets: 365 10*3/uL (ref 150.0–400.0)
RBC: 4.73 Mil/uL (ref 3.87–5.11)
RDW: 13.3 % (ref 11.5–15.5)
WBC: 8.9 10*3/uL (ref 4.0–10.5)

## 2023-08-01 LAB — COMPREHENSIVE METABOLIC PANEL
ALT: 11 U/L (ref 0–35)
AST: 14 U/L (ref 0–37)
Albumin: 4.6 g/dL (ref 3.5–5.2)
Alkaline Phosphatase: 67 U/L (ref 39–117)
BUN: 14 mg/dL (ref 6–23)
CO2: 29 meq/L (ref 19–32)
Calcium: 9.8 mg/dL (ref 8.4–10.5)
Chloride: 107 meq/L (ref 96–112)
Creatinine, Ser: 0.71 mg/dL (ref 0.40–1.20)
GFR: 79.89 mL/min (ref >60.00)
Glucose, Bld: 108 mg/dL — ABNORMAL HIGH (ref 70–99)
Potassium: 5.3 meq/L — ABNORMAL HIGH (ref 3.5–5.1)
Sodium: 141 meq/L (ref 135–145)
Total Bilirubin: 0.4 mg/dL (ref 0.2–1.2)
Total Protein: 7 g/dL (ref 6.0–8.3)

## 2023-08-01 LAB — LIPID PANEL
Cholesterol: 204 mg/dL — ABNORMAL HIGH (ref 0–200)
HDL: 39.9 mg/dL (ref >39.00)
LDL Cholesterol: 140 mg/dL — ABNORMAL HIGH (ref 0–99)
NonHDL: 164.2
Total CHOL/HDL Ratio: 5
Triglycerides: 122 mg/dL (ref 0.0–149.0)
VLDL: 24.4 mg/dL (ref 0.0–40.0)

## 2023-08-01 LAB — TSH: TSH: 1.25 u[IU]/mL (ref 0.35–5.50)

## 2023-08-01 MED ORDER — LEVOTHYROXINE SODIUM 88 MCG PO TABS: 88.0000 ug | ORAL_TABLET | Freq: Every day | ORAL | 3 refills | Status: DC

## 2023-08-01 NOTE — Assessment & Plan Note (Signed)
Refill med Check labs

## 2023-08-01 NOTE — Assessment & Plan Note (Signed)
Per cardiology Will get labs today

## 2023-08-01 NOTE — Assessment & Plan Note (Signed)
Ghm utd Check labs  See AVS Health Maintenance  Topic Date Due   DTaP/Tdap/Td (1 - Tdap) Never done   Zoster Vaccines- Shingrix (1 of 2) Never done   COVID-19 Vaccine (1 - 2023-24 season) Never done   INFLUENZA VACCINE  11/25/2023 (Originally 03/28/2023)   Pneumonia Vaccine 29+ Years old (1 of 1 - PCV) 07/31/2024 (Originally 06/07/2007)   DEXA SCAN  07/31/2024 (Originally 06/07/2007)   Medicare Annual Wellness (AWV)  04/16/2024   HPV VACCINES  Aged Out   Hepatitis C Screening  Discontinued

## 2023-08-01 NOTE — Assessment & Plan Note (Signed)
Well controlled, no changes to meds. Encouraged heart healthy diet such as the DASH diet and exercise as tolerated.  °

## 2023-08-01 NOTE — Patient Instructions (Signed)
Preventive Care 65 Years and Older, Female Preventive care refers to lifestyle choices and visits with your health care provider that can promote health and wellness. Preventive care visits are also called wellness exams. What can I expect for my preventive care visit? Counseling Your health care provider may ask you questions about your: Medical history, including: Past medical problems. Family medical history. Pregnancy and menstrual history. History of falls. Current health, including: Memory and ability to understand (cognition). Emotional well-being. Home life and relationship well-being. Sexual activity and sexual health. Lifestyle, including: Alcohol, nicotine or tobacco, and drug use. Access to firearms. Diet, exercise, and sleep habits. Work and work environment. Sunscreen use. Safety issues such as seatbelt and bike helmet use. Physical exam Your health care provider will check your: Height and weight. These may be used to calculate your BMI (body mass index). BMI is a measurement that tells if you are at a healthy weight. Waist circumference. This measures the distance around your waistline. This measurement also tells if you are at a healthy weight and may help predict your risk of certain diseases, such as type 2 diabetes and high blood pressure. Heart rate and blood pressure. Body temperature. Skin for abnormal spots. What immunizations do I need?  Vaccines are usually given at various ages, according to a schedule. Your health care provider will recommend vaccines for you based on your age, medical history, and lifestyle or other factors, such as travel or where you work. What tests do I need? Screening Your health care provider may recommend screening tests for certain conditions. This may include: Lipid and cholesterol levels. Hepatitis C test. Hepatitis B test. HIV (human immunodeficiency virus) test. STI (sexually transmitted infection) testing, if you are at  risk. Lung cancer screening. Colorectal cancer screening. Diabetes screening. This is done by checking your blood sugar (glucose) after you have not eaten for a while (fasting). Mammogram. Talk with your health care provider about how often you should have regular mammograms. BRCA-related cancer screening. This may be done if you have a family history of breast, ovarian, tubal, or peritoneal cancers. Bone density scan. This is done to screen for osteoporosis. Talk with your health care provider about your test results, treatment options, and if necessary, the need for more tests. Follow these instructions at home: Eating and drinking  Eat a diet that includes fresh fruits and vegetables, whole grains, lean protein, and low-fat dairy products. Limit your intake of foods with high amounts of sugar, saturated fats, and salt. Take vitamin and mineral supplements as recommended by your health care provider. Do not drink alcohol if your health care provider tells you not to drink. If you drink alcohol: Limit how much you have to 0-1 drink a day. Know how much alcohol is in your drink. In the [place] one drink equals one 12 oz bottle of beer (355 mL), one 5 oz glass of wine (148 mL), or one 1 oz glass of hard liquor (44 mL). Lifestyle Brush your teeth every morning and night with fluoride toothpaste. Floss one time each day. Exercise for at least 30 minutes 5 or more days each week. Do not use any products that contain nicotine or tobacco. These products include cigarettes, chewing tobacco, and vaping devices, such as e-cigarettes. If you need help quitting, ask your health care provider. Do not use drugs. If you are sexually active, practice safe sex. Use a condom or other form of protection in order to prevent STIs. Take aspirin only as told by   your health care provider. Make sure that you understand how much to take and what form to take. Work with your health care provider to find out whether it  is safe and beneficial for you to take aspirin daily. Ask your health care provider if you need to take a cholesterol-lowering medicine (statin). Find healthy ways to manage stress, such as: Meditation, yoga, or listening to music. Journaling. Talking to a trusted person. Spending time with friends and family. Minimize exposure to UV radiation to reduce your risk of skin cancer. Safety Always wear your seat belt while driving or riding in a vehicle. Do not drive: If you have been drinking alcohol. Do not ride with someone who has been drinking. When you are tired or distracted. While texting. If you have been using any mind-altering substances or drugs. Wear a helmet and other protective equipment during sports activities. If you have firearms in your house, make sure you follow all gun safety procedures. What's next? Visit your health care provider once a year for an annual wellness visit. Ask your health care provider how often you should have your eyes and teeth checked. Stay up to date on all vaccines. This information is not intended to replace advice given to you by your health care provider. Make sure you discuss any questions you have with your health care provider. Document Revised: 02/08/2021 Document Reviewed: 02/08/2021 Elsevier Patient Education  2024 [employer]  

## 2023-08-01 NOTE — Progress Notes (Signed)
Established Patient Office Visit  Subjective   Patient ID: Vickie [name], female    DOB: Aug 07, 1941  Age: 82 y.o. MRN: [id number]  Chief Complaint  Patient presents with   New Patient (Initial Visit)    HPI Discussed the use of AI scribe software for clinical note transcription with the patient, who gave verbal consent to proceed.  History of Present Illness   The patient, aged 82, presents for an annual review of her thyroid management and medication refill. She reports a generally good state of health, maintaining an active lifestyle that includes regular gardening. She has a history of hypercholesterolemia, for which she was previously on injectable medication. However, this was discontinued approximately three years ago during a transition between healthcare providers. The patient reports no recent cholesterol management, but is aware of a planned referral to a lipid clinic.  The patient also has a history of hypertension, previously managed with Lisinopril, but this was discontinued due to high potassium levels. She is currently on Amlodipine, and reports feeling well despite slightly elevated blood pressure readings.  Approximately eight years ago, the patient underwent a stent placement for a 90% blockage near her heart. Since then, she reports no cardiac issues. She also mentions occasional sciatica, which she manages with heat therapy and over-the-counter patches.  The patient does not engage in regular health screenings such as mammograms or bone density tests, and declines vaccinations. She expresses a preference for minimal medical intervention, focusing on maintaining her current health status.      Patient Active Problem List   Diagnosis Date Noted   Statin intolerance 07/16/2023   CAD (coronary artery disease) 06/21/2022   Gallstones 06/21/2022   UTI (urinary tract infection) 08/01/2020   Hyponatremia 06/22/2020   Chronic constipation 06/09/2018   Diastolic  dysfunction without heart failure 01/20/2016   Chronic diastolic heart failure (HCC) 01/20/2016   CAD in native artery 01/07/2016   Hyperlipidemia 07/03/2013   Overweight (BMI 25.0-29.9) 07/03/2013   Preventative health care 07/03/2013   Gastroesophageal reflux disease 07/03/2013   Chronic urticaria 03/16/2013   Hypothyroidism 08/14/2006   Essential hypertension 08/14/2006   Past Medical History:  Diagnosis Date   CAD (coronary artery disease)    CAD in native artery 01/07/2016   Successful percutaneous coronary intervention to the 90% ostial/proximal LAD stenosis utilizing Angiosculpt scoring balloon, and ultimate insertion of a 2.2512 mm Resolute DES stent postdilated to 2.5 mm with a 90% stenosis being reduced to 0% on 01/06/2016   Chronic constipation 06/09/2018   Chronic diastolic heart failure (HCC) 01/20/2016   Grade I by Echo 01/19/2016   Chronic urticaria 03/16/2013   Diastolic dysfunction without heart failure 01/20/2016   Grade I by Echo 01/19/2016   Essential hypertension 08/14/2006   Gallstones    Gastroesophageal reflux disease 07/03/2013   Symptoms are not significant enough that patient wants pharmacologic therapy at this point    Healthcare maintenance 07/03/2013   Hyperlipidemia 07/03/2013   Hyponatremia 06/22/2020   Hypothyroidism 08/14/2006   Overweight (BMI 25.0-29.9) 07/03/2013   UTI (urinary tract infection) 08/01/2020   Past Surgical History:  Procedure Laterality Date   CARDIAC CATHETERIZATION N/A 01/06/2016   Procedure: Left Heart Cath and Coronary Angiography;  Surgeon: Thurmon Fair, MD;  Location: [facility];  Service: Cardiovascular;  Laterality: N/A;   CARDIAC CATHETERIZATION N/A 01/06/2016   Procedure: Coronary Stent Intervention;  Surgeon: Lennette Bihari, MD;  Location: [facility];  Service: Cardiovascular;  Laterality: N/A;  CHOLECYSTECTOMY     TUBAL LIGATION     Social History   Tobacco Use   Smoking status: Never   Smokeless  tobacco: Never  Vaping Use   Vaping status: Never Used  Substance Use Topics   Alcohol use: Yes    Comment: Rarely.   Drug use: No   Social History   Socioeconomic History   Marital status: Divorced    Spouse name: Not on file   Number of children: 3   Years of education: Not on file   Highest education level: Not on file  Occupational History   Occupation: Retired   Tobacco Use   Smoking status: Never   Smokeless tobacco: Never  Vaping Use   Vaping status: Never Used  Substance and Sexual Activity   Alcohol use: Yes    Comment: Rarely.   Drug use: No   Sexual activity: Never  Other Topics Concern   Not on file  Social History Narrative   Current Social History 10/04/2020        Patient lives with family (younger son, 2 granddaughters, and grandson) in a suite over the garage which is 2 story. There are steps up to the entrance the patient uses the steps are a lift chair.       Patient's method of transportation is via family member.      The highest level of education was some college.      The patient currently retired.      Identified important Relationships are Sons, family       Pets : a dog she takes on walks       Interests / Fun: gardening       Current Stressors: transportation issues       Religious / Personal Beliefs: Christian       Other:     Social Determinants of Health   Financial Resource Strain: Low Risk  (04/17/2023)   Overall Financial Resource Strain (CARDIA)    Difficulty of Paying Living Expenses: Not hard at all  Food Insecurity: No Food Insecurity (04/17/2023)   Hunger Vital Sign    Worried About Running Out of Food in the Last Year: Never true    Ran Out of Food in the Last Year: Never true  Transportation Needs: No Transportation Needs (04/17/2023)   PRAPARE - Administrator, Civil Service (Medical): No    Lack of Transportation (Non-Medical): No  Physical Activity: Insufficiently Active (04/17/2023)   Exercise Vital  Sign    Days of Exercise per Week: 7 days    Minutes of Exercise per Session: 20 min  Stress: No Stress Concern Present (04/17/2023)   [employer] of Occupational Health - Occupational Stress Questionnaire    Feeling of Stress : Not at all  Social Connections: Socially Isolated (04/17/2023)   Social Connection and Isolation Panel [NHANES]    Frequency of Communication with Friends and Family: Never    Frequency of Social Gatherings with Friends and Family: More than three times a week    Attends Religious Services: Never    Database administrator or Organizations: No    Attends Banker Meetings: Never    Marital Status: Divorced  Catering manager Violence: Patient Unable To Answer (04/17/2023)   Humiliation, Afraid, Rape, and Kick questionnaire    Fear of Current or Ex-Partner: Patient unable to answer    Emotionally Abused: Patient unable to answer    Physically Abused: Patient unable to answer  Sexually Abused: Patient unable to answer   Family Status  Relation Name Status   Sister Half Deceased   Brother Half Alive   Mother  Deceased   Father  Deceased   Son  Alive   Son  Alive   Son Lincoln Alive   Sister Holly-half sister Alive   Sister ' Alive   Mat Aunt Betty Alive   Neg Hx  (Not Specified)  No partnership data on file   Family History  Problem Relation Age of Onset   Lung cancer Sister    Healthy Sister        Unknown health/Unknown health   Healthy Brother        Unknown health   Heart disease Mother    Unexplained death Father        Killed in World War II   Diabetes Son    Healthy Son    Bladder Cancer Son    Breast cancer Sister    Healthy Sister        Unknown health   Bladder Cancer Maternal Aunt    Colon cancer Neg Hx    Esophageal cancer Neg Hx    Allergies  Allergen Reactions   Atorvastatin Other (See Comments)    Elevated liver functions numbers   Rosuvastatin Other (See Comments)    Constipation       Review of  Systems  Constitutional:  Negative for fever and malaise/fatigue.  HENT:  Negative for congestion.   Eyes:  Negative for blurred vision.  Respiratory:  Negative for cough and shortness of breath.   Cardiovascular:  Negative for chest pain, palpitations and leg swelling.  Gastrointestinal:  Negative for vomiting.  Musculoskeletal:  Positive for joint pain. Negative for back pain.  Skin:  Negative for rash.  Neurological:  Negative for loss of consciousness and headaches.      Objective:     BP 136/80 (BP Location: Right Arm, Patient Position: Sitting, Cuff Size: Normal)   Pulse 70   Temp 97.7 F (36.5 C) (Oral)   Resp 18   Ht 5' (1.524 m)   Wt 137 lb (62.1 kg)   LMP 10/20/1991   SpO2 96%   BMI 26.76 kg/m  BP Readings from Last 3 Encounters:  08/01/23 136/80  07/16/23 (!) 142/82  04/17/23 (!) 145/72   Wt Readings from Last 3 Encounters:  08/01/23 137 lb (62.1 kg)  07/16/23 136 lb 0.6 oz (61.7 kg)  04/17/23 140 lb (63.5 kg)   SpO2 Readings from Last 3 Encounters:  08/01/23 96%  07/16/23 96%  07/04/22 95%      Physical Exam Vitals and nursing note reviewed.  Constitutional:      General: She is not in acute distress.    Appearance: Normal appearance. She is well-developed.  HENT:     Head: Normocephalic and atraumatic.     Right Ear: Tympanic membrane, ear canal and external ear normal. There is no impacted cerumen.     Left Ear: Tympanic membrane, ear canal and external ear normal. There is no impacted cerumen.     Nose: Nose normal.     Mouth/Throat:     Mouth: Mucous membranes are moist.     Pharynx: Oropharynx is clear. No oropharyngeal exudate or posterior oropharyngeal erythema.  Eyes:     General: No scleral icterus.       Right eye: No discharge.        Left eye: No discharge.     Conjunctiva/sclera: Conjunctivae  normal.     Pupils: Pupils are equal, round, and reactive to light.  Neck:     Thyroid: No thyromegaly or thyroid tenderness.      Vascular: No JVD.  Cardiovascular:     Rate and Rhythm: Normal rate and regular rhythm.     Heart sounds: Murmur heard.     Systolic murmur is present with a grade of 2/6.  Pulmonary:     Effort: Pulmonary effort is normal. No respiratory distress.     Breath sounds: Normal breath sounds.  Abdominal:     General: Bowel sounds are normal. There is no distension.     Palpations: Abdomen is soft. There is no mass.     Tenderness: There is no abdominal tenderness. There is no guarding or rebound.  Musculoskeletal:        General: Normal range of motion.     Cervical back: Normal range of motion and neck supple.     Right lower leg: No edema.     Left lower leg: No edema.  Lymphadenopathy:     Cervical: No cervical adenopathy.  Skin:    General: Skin is warm and dry.     Findings: No erythema or rash.  Neurological:     Mental Status: She is alert and oriented to person, place, and time.     Cranial Nerves: No cranial nerve deficit.     Deep Tendon Reflexes: Reflexes are normal and symmetric.  Psychiatric:        Mood and Affect: Mood normal.        Behavior: Behavior normal.        Thought Content: Thought content normal.        Judgment: Judgment normal.      No results found for any visits on 08/01/23.  Last CBC Lab Results  Component Value Date   WBC 9.0 06/16/2021   HGB 14.4 06/16/2021   HCT 43.3 06/16/2021   MCV 93 06/16/2021   MCH 30.9 06/16/2021   RDW 12.5 06/16/2021   PLT 349 06/16/2021   Last metabolic panel Lab Results  Component Value Date   GLUCOSE 111 (H) 03/23/2022   NA 140 03/23/2022   K 5.3 (H) 03/23/2022   CL 102 03/23/2022   CO2 25 03/23/2022   BUN 13 03/23/2022   CREATININE 0.84 03/23/2022   EGFR 71 03/23/2022   CALCIUM 9.7 03/23/2022   PROT 6.9 06/16/2021   ALBUMIN 4.7 06/16/2021   LABGLOB 2.2 06/16/2021   AGRATIO 2.1 06/16/2021   BILITOT 0.2 06/16/2021   ALKPHOS 75 06/16/2021   AST 20 06/16/2021   ALT 16 06/16/2021   ANIONGAP 8  06/30/2020   Last lipids Lab Results  Component Value Date   CHOL 204 (H) 03/23/2022   HDL 35 (L) 03/23/2022   LDLCALC 140 (H) 03/23/2022   TRIG 159 (H) 03/23/2022   CHOLHDL 5.8 (H) 03/23/2022   Last hemoglobin A1c Lab Results  Component Value Date   HGBA1C 6.4 (H) 06/28/2020   Last thyroid functions Lab Results  Component Value Date   TSH 0.768 03/23/2022   Last vitamin D No results found for: "25OHVITD2", "25OHVITD3", "VD25OH" Last vitamin B12 and Folate No results found for: "VITAMINB12", "FOLATE"    The ASCVD Risk score (Arnett DK, et al., 2019) failed to calculate for the following reasons:   The 2019 ASCVD risk score is only valid for ages 33 to 38    Assessment & Plan:   Problem List Items Addressed This  Visit       Unprioritized   Diastolic dysfunction without heart failure (Chronic)   Relevant Orders   CBC with Differential/Platelet   Comprehensive metabolic panel   Lipid panel   TSH   Hyperlipidemia (Chronic)   Relevant Orders   CBC with Differential/Platelet   Comprehensive metabolic panel   Lipid panel   AMB Referral to [facility]   Preventative health care    Ghm utd Check labs  See AVS Health Maintenance  Topic Date Due   DTaP/Tdap/Td (1 - Tdap) Never done   Zoster Vaccines- Shingrix (1 of 2) Never done   COVID-19 Vaccine (1 - 2023-24 season) Never done   INFLUENZA VACCINE  11/25/2023 (Originally 03/28/2023)   Pneumonia Vaccine 34+ Years old (1 of 1 - PCV) 07/31/2024 (Originally 06/07/2007)   DEXA SCAN  07/31/2024 (Originally 06/07/2007)   Medicare Annual Wellness (AWV)  04/16/2024   HPV VACCINES  Aged Out   Hepatitis C Screening  Discontinued         Hypothyroidism (Chronic)    Refill med Check labs       Relevant Medications   levothyroxine (SYNTHROID) 88 MCG tablet   Other Relevant Orders   TSH   Essential hypertension (Chronic)    Well controlled, no changes to meds. Encouraged heart healthy diet such  as the DASH diet and exercise as tolerated.        CAD in native artery    Per cardiology Will get labs today      Relevant Orders   CBC with Differential/Platelet   Comprehensive metabolic panel   Lipid panel   AMB Referral to [facility]   Other Visit Diagnoses     Aortic atherosclerosis (HCC)    -  Primary     Assessment and Plan    Uterine Fibroids   For her annual follow-up on uterine fibroids, she reports no new symptoms. We will order annual labs and refill her medications.  Coronary Artery Disease with Stent Placement   Since her stent placement in 2017 for a 90% blockage, she has experienced no cardiac symptoms. We will monitor for cardiac symptoms and ensure the cardiologist has access to her lab results.  Hypertension   Her hypertension is well-controlled with amlodipine, following previous issues with lisinopril due to hyperkalemia. She reports feeling better with slightly higher blood pressure. We will continue amlodipine and monitor her blood pressure regularly.  Hyperlipidemia   Her hyperlipidemia has been untreated for three years due to a transition between doctors and lack of follow-up, though it was previously managed with injections. The cardiologist is monitoring. She prefers not to answer unknown calls, which has led to missed follow-ups. We will order a lipid panel, coordinate with the lipid clinic for follow-up, and ensure she receives direct contact information for the lipid clinic.  Sciatica   She experiences intermittent hip pain likely due to sciatica, managed with activity modification and home remedies. We recommend over-the-counter lidocaine patches if symptoms worsen and encourage continued physical activity with caution.  General Health Maintenance   She declines vaccinations and routine screenings such as mammograms and bone density tests, with no recent eye or dental exams, preferring minimal medical interventions. We will  respect her decision to decline vaccinations and screenings while encouraging regular eye and dental check-ups.  Follow-up   We will schedule a follow-up appointment with her cardiologist in one year and ensure she receives lab results and any necessary follow-up calls.  Return in about 1 year (around 07/31/2024), or if symptoms worsen or fail to improve, for annual exam, fasting.    Donato Schultz, DO

## 2023-10-07 ENCOUNTER — Telehealth: Payer: Self-pay | Admitting: Cardiology

## 2023-10-07 DIAGNOSIS — Z789 Other specified health status: Secondary | ICD-10-CM

## 2023-10-07 DIAGNOSIS — E782 Mixed hyperlipidemia: Secondary | ICD-10-CM

## 2023-10-07 NOTE — Telephone Encounter (Signed)
No Vm

## 2023-10-07 NOTE — Telephone Encounter (Signed)
 Patient calling to speak to the nurse about her cholesterol. Please advise

## 2023-10-08 NOTE — Telephone Encounter (Signed)
Pt states that she never received information regarding her lipids and what medication she needs to take to get it controlled. Please review the latest labs in EPIC and advise.

## 2023-10-15 NOTE — Telephone Encounter (Signed)
 Pt calling back to see if anyone has reviewed her labs. Has been waiting on cb. Requesting cb

## 2023-10-15 NOTE — Telephone Encounter (Signed)
 Recommendations reviewed with pt as per Dr. Kem Parkinson note.  Pt verbalized understanding and had no additional questions.

## 2023-10-17 ENCOUNTER — Ambulatory Visit: Payer: 59

## 2023-12-11 ENCOUNTER — Ambulatory Visit: Payer: 59 | Attending: Cardiology | Admitting: Pharmacist Clinician (PhC)/ Clinical Pharmacy Specialist

## 2023-12-11 ENCOUNTER — Telehealth: Payer: Self-pay | Admitting: Pharmacy Technician

## 2023-12-11 ENCOUNTER — Encounter: Payer: Self-pay | Admitting: Pharmacist Clinician (PhC)/ Clinical Pharmacy Specialist

## 2023-12-11 ENCOUNTER — Telehealth: Payer: Self-pay | Admitting: Pharmacist Clinician (PhC)/ Clinical Pharmacy Specialist

## 2023-12-11 ENCOUNTER — Other Ambulatory Visit (HOSPITAL_COMMUNITY): Payer: Self-pay

## 2023-12-11 DIAGNOSIS — E785 Hyperlipidemia, unspecified: Secondary | ICD-10-CM

## 2023-12-11 NOTE — Telephone Encounter (Signed)
 Please do PA for Repatha (or Praluent 150).  Pt should be dual Medicare/Medicaid

## 2023-12-11 NOTE — Telephone Encounter (Signed)
 Pharmacy Patient Advocate Encounter  Received notification from [facility] that Prior Authorization for repatha has been APPROVED from 12/11/23 to 06/11/24. Ran test claim, Copay is $0.00. This test claim was processed through [facility]- copay amounts may vary at other pharmacies due to pharmacy/plan contracts, or as the patient moves through the different stages of their insurance plan.   PA #/Case ID/Reference #: [id number]

## 2023-12-11 NOTE — Assessment & Plan Note (Signed)
 Assessment: Patient with ASCVD not at LDL goal of < 70 Most recent LDL 1404 on 08/01/23 Not able to tolerate statins secondary to elevated liver enzymes Reviewed options for lowering LDL cholesterol, specifically PCSK-9 inhibitors.Discussed mechanisms of action, dosing, side effects, potential decreases in LDL cholesterol and costs.  Also reviewed potential options for patient assistance. Previously on Praluent, was lost to follow up when Dr. Theodis Fiscal moved to [facility] office.    Plan: Patient agreeable to starting Repatha Repeat labs after:  3 months Lipid Liver function

## 2023-12-11 NOTE — Progress Notes (Signed)
 Office Visit    Patient Name: Vickie [name] Date of Encounter: 12/11/2023  Primary Care Provider:  Zola Button, Grayling Congress, DO Primary Cardiologist:  None  Chief Complaint    Hyperlipidemia   Significant Past Medical History   CAD 2017 - PCI to pLAD, with DES  HTN On amlodipine, nebivolol  CHF Chronic diastolic  hypothyroid On levothyroxine 88 mcg     Allergies  Allergen Reactions   Atorvastatin Other (See Comments)    Elevated liver functions numbers   Rosuvastatin Other (See Comments)    Constipation     History of Present Illness    Vickie [name] is a 83 y.o. female patient of Dr Tomie China, in the office today to discuss options for cholesterol management.  Insurance Carrier: [employer] Medicare/medicaid  Pharmacy:  [facility]   [facility]well:      LDL Cholesterol goal:  LDL < 70  Current Medications:     Previously tried:  atorvastatin, rosuvastatin - elevated liver enzymes  Family Hx:  strong history of heart disease    Social Hx: Tobacco:  no Alcohol:   no, only rarely   Diet:  mostly home cooked meals, trying to eat healthier  Exercise: gardens as much as she can, loves to be digging in the dirt; flower gardening  Adherence Assessment  Do you ever forget to take your medication? [] Yes [x] No - has been better over last few years  Do you ever skip doses due to side effects? [] Yes [x] No  Do you have trouble affording your medicines? [] Yes [x] No  Are you ever unable to pick up your medication due to transportation difficulties? [] Yes [x] No   Adherence strategy: 7 day minder    Accessory Clinical Findings   Lab Results  Component Value Date   CHOL 204 (H) 08/01/2023   HDL 39.90 08/01/2023   LDLCALC 140 (H) 08/01/2023   TRIG 122.0 08/01/2023   CHOLHDL 5 08/01/2023    No results found for: "LIPOA"  Lab Results  Component Value Date   ALT 11 08/01/2023   AST 14 08/01/2023   ALKPHOS 67 08/01/2023   BILITOT 0.4 08/01/2023   Lab Results   Component Value Date   CREATININE 0.71 08/01/2023   BUN 14 08/01/2023   NA 141 08/01/2023   K 5.3 (H) 08/01/2023   CL 107 08/01/2023   CO2 29 08/01/2023   Lab Results  Component Value Date   HGBA1C 6.4 (H) 06/28/2020    Home Medications    Current Outpatient Medications  Medication Sig Dispense Refill   amLODipine (NORVASC) 2.5 MG tablet Take 1 tablet (2.5 mg total) by mouth daily. 90 tablet 3   aspirin 81 MG tablet Take 1 tablet (81 mg total) by mouth daily. 30 tablet 6   famotidine (PEPCID) 20 MG tablet TAKE 1 TABLET BY MOUTH TWICE A DAY (Patient not taking: Reported on 07/16/2023) 180 tablet 3   ibuprofen (ADVIL) 200 MG tablet Take 200-400 mg by mouth every 6 (six) hours as needed for mild pain (or discomfort).     isosorbide mononitrate (IMDUR) 60 MG 24 hr tablet Take 1 tablet (60 mg total) by mouth daily. 90 tablet 3   levothyroxine (SYNTHROID) 88 MCG tablet Take 1 tablet (88 mcg total) by mouth daily before breakfast. 90 tablet 3   nebivolol (BYSTOLIC) 10 MG tablet TAKE 1 ZOXWRU(04 MG) BY MOUTH DAILY 90 tablet 3   nitroGLYCERIN (NITROSTAT) 0.4 MG SL tablet Place 1 tablet (0.4 mg total) under the tongue every  5 (five) minutes as needed for chest pain. 25 tablet 11   No current facility-administered medications for this visit.     Assessment & Plan    Hyperlipidemia Assessment: Patient with ASCVD not at LDL goal of < 70 Most recent LDL 1404 on 08/01/23 Not able to tolerate statins secondary to elevated liver enzymes Reviewed options for lowering LDL cholesterol, specifically PCSK-9 inhibitors.Discussed mechanisms of action, dosing, side effects, potential decreases in LDL cholesterol and costs.  Also reviewed potential options for patient assistance. Previously on Praluent, was lost to follow up when Dr. Theodis Fiscal moved to [facility] office.    Plan: Patient agreeable to starting Repatha Repeat labs after:  3 months Lipid Liver function    Donivan Furry, PharmD  CPP [facility] [address]  [place], [place] [address] [phone]  12/11/2023, 2:14 PM

## 2023-12-11 NOTE — Patient Instructions (Signed)
 Your Results:             Your most recent labs Goal  Total Cholesterol 204 < 200  Triglycerides 122 < 150  HDL (happy/good cholesterol) 39.9 > 40  LDL (lousy/bad cholesterol 140 < 70   Medication changes:  We will start the process to get Repatha (or Praluent) covered by your insurance.  Once the prior authorization is complete, I will call to let you know and confirm pharmacy information.   You will take one injection every 14 days  Lab orders:  We want to repeat labs after 2-3 months.  We will send you a lab order to remind you once we get closer to that time.     Thank you for choosing [facility]

## 2023-12-11 NOTE — Telephone Encounter (Signed)
 Pharmacy Patient Advocate Encounter   Received notification from Pt Calls Messages that prior authorization for repatha is required/requested.   Insurance verification completed.   The patient is insured through [place] .   Per test claim: PA required; PA submitted to above mentioned insurance via CoverMyMeds Key/confirmation #/EOC  [id number] Status is pending

## 2023-12-16 MED ORDER — REPATHA SURECLICK 140 MG/ML ~~LOC~~ SOAJ
140.0000 mg | SUBCUTANEOUS | 3 refills | Status: DC
Start: 2023-12-16 — End: 2024-07-22

## 2023-12-16 NOTE — Addendum Note (Signed)
 Addended by: Annika Selke L on: 12/16/2023 01:11 PM   Modules accepted: Orders

## 2023-12-16 NOTE — Telephone Encounter (Signed)
 Repatha  approved, rx sent to [facility]

## 2024-01-01 NOTE — Telephone Encounter (Signed)
 Patient is returning call.

## 2024-02-04 ENCOUNTER — Telehealth: Payer: Self-pay | Admitting: Family Medicine

## 2024-02-04 NOTE — Telephone Encounter (Signed)
 Copied from CRM [id number]. Topic: Medicare AWV >> Feb 04, 2024  2:07 PM Juliana Ocean wrote: Reason for CRM: Called 02/04/2024 to sched AWV - NO VOICEMAIL  Rosalee Collins; Care Guide Ambulatory Clinical Support [place] [facility] Direct Dial: [phone]

## 2024-05-13 ENCOUNTER — Telehealth: Payer: Self-pay | Admitting: Family Medicine

## 2024-05-13 NOTE — Telephone Encounter (Signed)
 Copied from CRM [id number]. Topic: Medicare AWV >> May 13, 2024  3:29 PM Nathanel DEL wrote: Reason for CRM: Called 05/13/2024 to sched AWV - NO VOICEMAIL  Nathanel Paschal; Care Guide Ambulatory Clinical Support [place] [facility] Direct Dial: [phone]

## 2024-06-09 ENCOUNTER — Encounter: Admitting: Family Medicine

## 2024-06-22 ENCOUNTER — Other Ambulatory Visit: Payer: Self-pay | Admitting: Internal Medicine

## 2024-06-22 DIAGNOSIS — I10 Essential (primary) hypertension: Secondary | ICD-10-CM

## 2024-07-14 ENCOUNTER — Encounter: Admitting: Family Medicine

## 2024-07-20 ENCOUNTER — Encounter: Payer: Self-pay | Admitting: Family Medicine

## 2024-07-20 ENCOUNTER — Ambulatory Visit: Admitting: Family Medicine

## 2024-07-20 VITALS — BP 160/80 | HR 66 | Temp 98.0°F | Resp 16 | Ht 60.0 in | Wt 137.0 lb

## 2024-07-20 DIAGNOSIS — I10 Essential (primary) hypertension: Secondary | ICD-10-CM | POA: Diagnosis not present

## 2024-07-20 DIAGNOSIS — E038 Other specified hypothyroidism: Secondary | ICD-10-CM | POA: Diagnosis not present

## 2024-07-20 DIAGNOSIS — Z0000 Encounter for general adult medical examination without abnormal findings: Secondary | ICD-10-CM | POA: Diagnosis not present

## 2024-07-20 DIAGNOSIS — I700 Atherosclerosis of aorta: Secondary | ICD-10-CM

## 2024-07-20 DIAGNOSIS — I5189 Other ill-defined heart diseases: Secondary | ICD-10-CM | POA: Diagnosis not present

## 2024-07-20 DIAGNOSIS — E785 Hyperlipidemia, unspecified: Secondary | ICD-10-CM | POA: Diagnosis not present

## 2024-07-20 LAB — COMPREHENSIVE METABOLIC PANEL WITH GFR
ALT: 16 U/L (ref 0–35)
AST: 19 U/L (ref 0–37)
Albumin: 4.8 g/dL (ref 3.5–5.2)
Alkaline Phosphatase: 61 U/L (ref 39–117)
BUN: 10 mg/dL (ref 6–23)
CO2: 30 meq/L (ref 19–32)
Calcium: 9.7 mg/dL (ref 8.4–10.5)
Chloride: 106 meq/L (ref 96–112)
Creatinine, Ser: 0.69 mg/dL (ref 0.40–1.20)
GFR: 80.99 mL/min (ref >60.00)
Glucose, Bld: 105 mg/dL — ABNORMAL HIGH (ref 70–99)
Potassium: 4.7 meq/L (ref 3.5–5.1)
Sodium: 142 meq/L (ref 135–145)
Total Bilirubin: 0.4 mg/dL (ref 0.2–1.2)
Total Protein: 7 g/dL (ref 6.0–8.3)

## 2024-07-20 LAB — LIPID PANEL
Cholesterol: 93 mg/dL (ref 0–200)
HDL: 41.6 mg/dL (ref >39.00)
LDL Cholesterol: 31 mg/dL (ref 0–99)
NonHDL: 51.63
Total CHOL/HDL Ratio: 2
Triglycerides: 105 mg/dL (ref 0.0–149.0)
VLDL: 21 mg/dL (ref 0.0–40.0)

## 2024-07-20 LAB — CBC WITH DIFFERENTIAL/PLATELET
Basophils Absolute: 0 K/uL (ref 0.0–0.1)
Basophils Relative: 0.6 % (ref 0.0–3.0)
Eosinophils Absolute: 0.1 K/uL (ref 0.0–0.7)
Eosinophils Relative: 0.8 % (ref 0.0–5.0)
HCT: 43.3 % (ref 36.0–46.0)
Hemoglobin: 14.4 g/dL (ref 12.0–15.0)
Lymphocytes Relative: 34.8 % (ref 12.0–46.0)
Lymphs Abs: 2.8 K/uL (ref 0.7–4.0)
MCHC: 33.3 g/dL (ref 30.0–36.0)
MCV: 92.7 fl (ref 78.0–100.0)
Monocytes Absolute: 0.5 K/uL (ref 0.1–1.0)
Monocytes Relative: 6.2 % (ref 3.0–12.0)
Neutro Abs: 4.6 K/uL (ref 1.4–7.7)
Neutrophils Relative %: 57.6 % (ref 43.0–77.0)
Platelets: 362 K/uL (ref 150.0–400.0)
RBC: 4.66 Mil/uL (ref 3.87–5.11)
RDW: 12.9 % (ref 11.5–15.5)
WBC: 8.1 K/uL (ref 4.0–10.5)

## 2024-07-20 LAB — TSH: TSH: 1.17 u[IU]/mL (ref 0.35–5.50)

## 2024-07-20 MED ORDER — LEVOTHYROXINE SODIUM 88 MCG PO TABS: 88.0000 ug | ORAL_TABLET | Freq: Every day | ORAL | 3 refills | Status: AC

## 2024-07-20 NOTE — Progress Notes (Signed)
 Subjective:    Patient ID: Vickie [name], female    DOB: 1940/09/21, 83 y.o.   MRN: [id number]  Chief Complaint  Patient presents with   Annual Exam    HPI Patient is in today for cpe and f/u.   Discussed the use of AI scribe software for clinical note transcription with the patient, who gave verbal consent to proceed.  History of Present Illness Vickie [name] is an 83 year old female with hypertension who presents with elevated blood pressure and a skin lesion.  She has experienced elevated blood pressure, with a recent measurement of 150/80 mmHg, which is higher than her usual levels. She attributes this to lack of sleep, having only slept for two hours the previous night. Her blood pressure has been fluctuating, with the diastolic number dropping quickly but then rising again. She continues to see her cardiologist for ongoing care.  She describes a skin lesion that appeared at the beginning of September, initially a small spot the size of her little fingernail on the outside of her leg. She attempted self-treatment, and her daughter-in-law provided her with Lotrimin, which she used. The lesion seemed to resolve but flared up again at the beginning of November. She notes improvement when she resumed using Lotrimin.  She requests a refill for her thyroid  medication.  She mentions a significant family stressor, as her second son has been on life support for over two months following a severe stroke, which has been emotionally taxing for her. She lives with her youngest son and is involved in family activities, such as dietitian Thanksgiving dinner.  No need for a flu shot, stating she has only had one in her life.    Past Medical History:  Diagnosis Date   CAD (coronary artery disease)    CAD in native artery 01/07/2016   Successful percutaneous coronary intervention to the 90% ostial/proximal LAD stenosis utilizing Angiosculpt scoring balloon, and ultimate insertion of a  2.2512 mm Resolute DES stent postdilated to 2.5 mm with a 90% stenosis being reduced to 0% on 01/06/2016   Chronic constipation 06/09/2018   Chronic diastolic heart failure (HCC) 01/20/2016   Grade I by Echo 01/19/2016   Chronic urticaria 03/16/2013   Diastolic dysfunction without heart failure 01/20/2016   Grade I by Echo 01/19/2016   Essential hypertension 08/14/2006   Gallstones    Gastroesophageal reflux disease 07/03/2013   Symptoms are not significant enough that patient wants pharmacologic therapy at this point    Healthcare maintenance 07/03/2013   Hyperlipidemia 07/03/2013   Hyponatremia 06/22/2020   Hypothyroidism 08/14/2006   Overweight (BMI 25.0-29.9) 07/03/2013   Preventative health care 07/03/2013   Statin intolerance 07/16/2023   UTI (urinary tract infection) 08/01/2020    Past Surgical History:  Procedure Laterality Date   CARDIAC CATHETERIZATION N/A 01/06/2016   Procedure: Left Heart Cath and Coronary Angiography;  Surgeon: Jerel Balding, MD;  Location: [facility];  Service: Cardiovascular;  Laterality: N/A;   CARDIAC CATHETERIZATION N/A 01/06/2016   Procedure: Coronary Stent Intervention;  Surgeon: Debby DELENA Sor, MD;  Location: [facility];  Service: Cardiovascular;  Laterality: N/A;   CHOLECYSTECTOMY     TUBAL LIGATION      Family History  Problem Relation Age of Onset   Lung cancer Sister    Healthy Sister        Unknown health/Unknown health   Healthy Brother        Unknown health   Heart disease Mother  Unexplained death Father        Killed in World War II   Diabetes Son    Healthy Son    Bladder Cancer Son    Breast cancer Sister    Healthy Sister        Unknown health   Bladder Cancer Maternal Aunt    Colon cancer Neg Hx    Esophageal cancer Neg Hx     Social History   Socioeconomic History   Marital status: Divorced    Spouse name: Not on file   Number of children: 3   Years of education: Not on file   Highest  education level: Not on file  Occupational History   Occupation: Retired   Tobacco Use   Smoking status: Never   Smokeless tobacco: Never  Vaping Use   Vaping status: Never Used  Substance and Sexual Activity   Alcohol use: Yes    Comment: Rarely.   Drug use: No   Sexual activity: Never  Other Topics Concern   Not on file  Social History Narrative   Current Social History 10/04/2020        Patient lives with family (younger son, 2 granddaughters, and grandson) in a suite over the garage which is 2 story. There are steps up to the entrance the patient uses the steps are a lift chair.       Patient's method of transportation is via family member.      The highest level of education was some college.      The patient currently retired.      Identified important Relationships are Sons, family       Pets : a dog she takes on walks       Interests / Fun: gardening       Current Stressors: transportation issues       Religious / Personal Beliefs: Christian       Other:     Social Drivers of Corporate Investment Banker Strain: Low Risk  (04/17/2023)   Overall Financial Resource Strain (CARDIA)    Difficulty of Paying Living Expenses: Not hard at all  Food Insecurity: No Food Insecurity (04/17/2023)   Hunger Vital Sign    Worried About Running Out of Food in the Last Year: Never true    Ran Out of Food in the Last Year: Never true  Transportation Needs: No Transportation Needs (04/17/2023)   PRAPARE - Administrator, Civil Service (Medical): No    Lack of Transportation (Non-Medical): No  Physical Activity: Insufficiently Active (04/17/2023)   Exercise Vital Sign    Days of Exercise per Week: 7 days    Minutes of Exercise per Session: 20 min  Stress: No Stress Concern Present (04/17/2023)   [employer] of Occupational Health - Occupational Stress Questionnaire    Feeling of Stress : Not at all  Social Connections: Socially Isolated (04/17/2023)   Social  Connection and Isolation Panel    Frequency of Communication with Friends and Family: Never    Frequency of Social Gatherings with Friends and Family: More than three times a week    Attends Religious Services: Never    Database Administrator or Organizations: No    Attends Banker Meetings: Never    Marital Status: Divorced  Catering Manager Violence: Patient Unable To Answer (04/17/2023)   Humiliation, Afraid, Rape, and Kick questionnaire    Fear of Current or Ex-Partner: Patient unable to answer  Emotionally Abused: Patient unable to answer    Physically Abused: Patient unable to answer    Sexually Abused: Patient unable to answer    Outpatient Medications Prior to Visit  Medication Sig Dispense Refill   amLODipine  (NORVASC ) 2.5 MG tablet Take 1 tablet (2.5 mg total) by mouth daily. 90 tablet 3   aspirin  81 MG tablet Take 1 tablet (81 mg total) by mouth daily. 30 tablet 6   Evolocumab  (REPATHA  SURECLICK) 140 MG/ML SOAJ Inject 140 mg into the skin every 14 (fourteen) days. 6 mL 3   ibuprofen (ADVIL) 200 MG tablet Take 200-400 mg by mouth every 6 (six) hours as needed for mild pain (or discomfort).     isosorbide  mononitrate (IMDUR ) 60 MG 24 hr tablet Take 1 tablet (60 mg total) by mouth daily. 90 tablet 3   nebivolol  (BYSTOLIC ) 10 MG tablet TAKE 1 TABLET(10 MG) BY MOUTH DAILY 90 tablet 3   nitroGLYCERIN  (NITROSTAT ) 0.4 MG SL tablet Place 1 tablet (0.4 mg total) under the tongue every 5 (five) minutes as needed for chest pain. 25 tablet 11   levothyroxine  (SYNTHROID ) 88 MCG tablet Take 1 tablet (88 mcg total) by mouth daily before breakfast. 90 tablet 3   famotidine  (PEPCID ) 20 MG tablet TAKE 1 TABLET BY MOUTH TWICE A DAY (Patient not taking: Reported on 07/20/2024) 180 tablet 3   No facility-administered medications prior to visit.    Allergies  Allergen Reactions   Atorvastatin  Other (See Comments)    Elevated liver functions numbers   Rosuvastatin  Other (See  Comments)    Constipation     Review of Systems  Constitutional:  Negative for chills, fever and malaise/fatigue.  HENT:  Negative for congestion and hearing loss.   Eyes:  Negative for blurred vision and discharge.  Respiratory:  Negative for cough, sputum production and shortness of breath.   Cardiovascular:  Negative for chest pain, palpitations and leg swelling.  Gastrointestinal:  Negative for abdominal pain, blood in stool, constipation, diarrhea, heartburn, nausea and vomiting.  Genitourinary:  Negative for dysuria, frequency, hematuria and urgency.  Musculoskeletal:  Negative for back pain, falls and myalgias.  Skin:  Negative for rash.  Neurological:  Negative for dizziness, sensory change, loss of consciousness, weakness and headaches.  Endo/Heme/Allergies:  Negative for environmental allergies. Does not bruise/bleed easily.  Psychiatric/Behavioral:  Negative for depression and suicidal ideas. The patient is not nervous/anxious and does not have insomnia.        Objective:    Physical Exam Vitals and nursing note reviewed.  Constitutional:      General: She is not in acute distress.    Appearance: Normal appearance. She is well-developed.  HENT:     Head: Normocephalic and atraumatic.     Right Ear: Tympanic membrane, ear canal and external ear normal. There is no impacted cerumen.     Left Ear: Tympanic membrane, ear canal and external ear normal. There is no impacted cerumen.     Nose: Nose normal.     Mouth/Throat:     Mouth: Mucous membranes are moist.     Pharynx: Oropharynx is clear. No oropharyngeal exudate or posterior oropharyngeal erythema.  Eyes:     General: No scleral icterus.       Right eye: No discharge.        Left eye: No discharge.     Conjunctiva/sclera: Conjunctivae normal.     Pupils: Pupils are equal, round, and reactive to light.  Neck:     Thyroid :  No thyromegaly or thyroid  tenderness.     Vascular: No JVD.  Cardiovascular:     Rate and  Rhythm: Normal rate and regular rhythm.     Heart sounds: Normal heart sounds. No murmur heard. Pulmonary:     Effort: Pulmonary effort is normal. No respiratory distress.     Breath sounds: Normal breath sounds.  Abdominal:     General: Bowel sounds are normal. There is no distension.     Palpations: Abdomen is soft. There is no mass.     Tenderness: There is no abdominal tenderness. There is no guarding or rebound.  Musculoskeletal:        General: Normal range of motion.     Cervical back: Normal range of motion and neck supple.     Right lower leg: No edema.     Left lower leg: No edema.  Lymphadenopathy:     Cervical: No cervical adenopathy.  Skin:    General: Skin is warm and dry.     Findings: No erythema or rash.  Neurological:     Mental Status: She is alert and oriented to person, place, and time.     Cranial Nerves: No cranial nerve deficit.     Deep Tendon Reflexes: Reflexes are normal and symmetric.  Psychiatric:        Mood and Affect: Mood normal.        Behavior: Behavior normal.        Thought Content: Thought content normal.        Judgment: Judgment normal.     BP (!) 160/80 (BP Location: Right Arm, Patient Position: Sitting, Cuff Size: Normal)   Pulse 66   Temp 98 F (36.7 C) (Oral)   Resp 16   Ht 5' (1.524 m)   Wt 137 lb (62.1 kg)   LMP 10/20/1991   SpO2 96%   BMI 26.76 kg/m  Wt Readings from Last 3 Encounters:  07/20/24 137 lb (62.1 kg)  08/01/23 137 lb (62.1 kg)  07/16/23 136 lb 0.6 oz (61.7 kg)    Diabetic Foot Exam - Simple   No data filed    Lab Results  Component Value Date   WBC 8.9 08/01/2023   HGB 14.9 08/01/2023   HCT 44.6 08/01/2023   PLT 365.0 08/01/2023   GLUCOSE 108 (H) 08/01/2023   CHOL 204 (H) 08/01/2023   TRIG 122.0 08/01/2023   HDL 39.90 08/01/2023   LDLCALC 140 (H) 08/01/2023   ALT 11 08/01/2023   AST 14 08/01/2023   NA 141 08/01/2023   K 5.3 (H) 08/01/2023   CL 107 08/01/2023   CREATININE 0.71 08/01/2023    BUN 14 08/01/2023   CO2 29 08/01/2023   TSH 1.25 08/01/2023   INR 0.92 01/05/2016   HGBA1C 6.4 (H) 06/28/2020    Lab Results  Component Value Date   TSH 1.25 08/01/2023   Lab Results  Component Value Date   WBC 8.9 08/01/2023   HGB 14.9 08/01/2023   HCT 44.6 08/01/2023   MCV 94.3 08/01/2023   PLT 365.0 08/01/2023   Lab Results  Component Value Date   NA 141 08/01/2023   K 5.3 (H) 08/01/2023   CO2 29 08/01/2023   GLUCOSE 108 (H) 08/01/2023   BUN 14 08/01/2023   CREATININE 0.71 08/01/2023   BILITOT 0.4 08/01/2023   ALKPHOS 67 08/01/2023   AST 14 08/01/2023   ALT 11 08/01/2023   PROT 7.0 08/01/2023   ALBUMIN 4.6 08/01/2023   CALCIUM  9.8  08/01/2023   ANIONGAP 8 06/30/2020   EGFR 71 03/23/2022   GFR 79.89 08/01/2023   Lab Results  Component Value Date   CHOL 204 (H) 08/01/2023   Lab Results  Component Value Date   HDL 39.90 08/01/2023   Lab Results  Component Value Date   LDLCALC 140 (H) 08/01/2023   Lab Results  Component Value Date   TRIG 122.0 08/01/2023   Lab Results  Component Value Date   CHOLHDL 5 08/01/2023   Lab Results  Component Value Date   HGBA1C 6.4 (H) 06/28/2020       Assessment & Plan:  Preventative health care Assessment & Plan: Ghm utd Check labs  See AVS  Health Maintenance  Topic Date Due   DTaP/Tdap/Td (1 - Tdap) Never done   Zoster Vaccines- Shingrix (1 of 2) Never done   Medicare Annual Wellness (AWV)  04/16/2024   COVID-19 Vaccine (1 - 2025-26 season) Never done   Pneumococcal Vaccine: 50+ Years (1 of 2 - PCV) 07/31/2024 (Originally 06/06/1960)   Bone Density Scan  07/31/2024 (Originally 06/07/2007)   Influenza Vaccine  11/24/2024 (Originally 03/27/2024)   Meningococcal B Vaccine  Aged Out   Hepatitis C Screening  Discontinued      Other specified hypothyroidism -     Levothyroxine  Sodium; Take 1 tablet (88 mcg total) by mouth daily before breakfast.  Dispense: 90 tablet; Refill: 3 -     TSH  Aortic  atherosclerosis  Hyperlipidemia, unspecified hyperlipidemia type -     Comprehensive metabolic panel with GFR -     Lipid panel  Essential hypertension -     CBC with Differential/Platelet  Diastolic dysfunction without heart failure -     CBC with Differential/Platelet  Assessment and Plan Assessment & Plan Adult Wellness Visit   Discussed recent hospitalization for COVID pneumonia and its lingering effects on lung sensitivity.  Essential hypertension   Recent episode of elevated blood pressure, likely due to lack of sleep and anxiety related to the visit. Blood pressure was 150/80 mmHg, higher than usual.  Hypothyroidism   Requires medication refill. Refilled thyroid  medication.  Tinea corporis, resolved   Previous episode of tinea corporis on the leg, treated with Lotrimin, resolved but flared up again recently. Likely responded to treatment, suggesting it was ringworm. Use Lotrimin Ultra for 10-14 days for current flare-up.    Talajah Slimp R Lowne Chase, DO

## 2024-07-20 NOTE — Assessment & Plan Note (Signed)
 Ghm utd Check labs  See AVS  Health Maintenance  Topic Date Due   DTaP/Tdap/Td (1 - Tdap) Never done   Zoster Vaccines- Shingrix (1 of 2) Never done   Medicare Annual Wellness (AWV)  04/16/2024   COVID-19 Vaccine (1 - 2025-26 season) Never done   Pneumococcal Vaccine: 50+ Years (1 of 2 - PCV) 07/31/2024 (Originally 06/06/1960)   Bone Density Scan  07/31/2024 (Originally 06/07/2007)   Influenza Vaccine  11/24/2024 (Originally 03/27/2024)   Meningococcal B Vaccine  Aged Out   Hepatitis C Screening  Discontinued

## 2024-07-22 ENCOUNTER — Encounter: Payer: Self-pay | Admitting: Cardiology

## 2024-07-22 ENCOUNTER — Ambulatory Visit: Attending: Cardiology | Admitting: Cardiology

## 2024-07-22 ENCOUNTER — Ambulatory Visit: Attending: Cardiology

## 2024-07-22 VITALS — BP 130/78 | HR 77 | Resp 97 | Ht 60.0 in | Wt 138.0 lb

## 2024-07-22 DIAGNOSIS — I10 Essential (primary) hypertension: Secondary | ICD-10-CM

## 2024-07-22 DIAGNOSIS — R002 Palpitations: Secondary | ICD-10-CM

## 2024-07-22 DIAGNOSIS — E782 Mixed hyperlipidemia: Secondary | ICD-10-CM

## 2024-07-22 DIAGNOSIS — I2510 Atherosclerotic heart disease of native coronary artery without angina pectoris: Secondary | ICD-10-CM | POA: Diagnosis not present

## 2024-07-22 DIAGNOSIS — Z789 Other specified health status: Secondary | ICD-10-CM | POA: Diagnosis not present

## 2024-07-22 DIAGNOSIS — R011 Cardiac murmur, unspecified: Secondary | ICD-10-CM | POA: Insufficient documentation

## 2024-07-22 DIAGNOSIS — I209 Angina pectoris, unspecified: Secondary | ICD-10-CM

## 2024-07-22 MED ORDER — REPATHA SURECLICK 140 MG/ML ~~LOC~~ SOAJ: 140.0000 mg | SUBCUTANEOUS | 3 refills | Status: AC

## 2024-07-22 MED ORDER — NITROGLYCERIN 0.4 MG SL SUBL: 0.4000 mg | SUBLINGUAL_TABLET | SUBLINGUAL | 11 refills | Status: AC | PRN

## 2024-07-22 MED ORDER — ISOSORBIDE MONONITRATE ER 60 MG PO TB24: 60.0000 mg | ORAL_TABLET | Freq: Every day | ORAL | 3 refills | Status: AC

## 2024-07-22 MED ORDER — AMLODIPINE BESYLATE 2.5 MG PO TABS: 2.5000 mg | ORAL_TABLET | Freq: Every day | ORAL | 3 refills | Status: AC

## 2024-07-22 NOTE — Progress Notes (Signed)
 Cardiology Office Note:    Date:  07/22/2024   ID:  Vickie [name], DOB [birth year], MRN [id number]  PCP:  Antonio Meth, Jamee SAUNDERS, DO  Cardiologist:  Jennifer SAUNDERS Crape, MD   Referring MD: Antonio Meth, Jamee SAUNDERS, *    ASSESSMENT:    1. Mixed hyperlipidemia   2. Essential hypertension   3. Cardiac angina   4. Coronary artery disease involving native coronary artery of native heart without angina pectoris   5. Statin intolerance    PLAN:    In order of problems listed above:  Coronary artery disease: Secondary prevention stressed with the patient.  Importance of compliance with diet medication stressed and patient verbalized standing.  Patient was advised to walk on a regular basis to the best of her ability. Essential hypertension: Blood pressure is stable and diet was emphasized.  Lifestyle modification urged. Mixed dyslipidemia: On lipid-lowering medications followed by primary care.  Lipids reviewed.  She is on Repatha . Cardiac murmur: Echocardiogram will be done to assess murmur heard on auscultation. Patient will be seen in follow-up appointment in 6 months or earlier if the patient has any concerns.    Medication Adjustments/Labs and Tests Ordered: Current medicines are reviewed at length with the patient today.  Concerns regarding medicines are outlined above.  Orders Placed This Encounter  Procedures   EKG 12-Lead   No orders of the defined types were placed in this encounter.    No chief complaint on file.    History of Present Illness:    Vickie [name] is a 83 y.o. female.  Patient has past medical history of coronary artery disease, essential hypertension, mixed dyslipidemia and coronary stenting in the past.  Overall she leads a sedentary lifestyle.  She tells me that she is active but does not exercise on a regular basis.  She has some orthopedic issues.  At the time of my evaluation, the patient is alert awake oriented and in no distress.  Past Medical  History:  Diagnosis Date   CAD (coronary artery disease)    CAD in native artery 01/07/2016   Successful percutaneous coronary intervention to the 90% ostial/proximal LAD stenosis utilizing Angiosculpt scoring balloon, and ultimate insertion of a 2.2512 mm Resolute DES stent postdilated to 2.5 mm with a 90% stenosis being reduced to 0% on 01/06/2016   Chronic constipation 06/09/2018   Chronic diastolic heart failure (HCC) 01/20/2016   Grade I by Echo 01/19/2016   Chronic urticaria 03/16/2013   Diastolic dysfunction without heart failure 01/20/2016   Grade I by Echo 01/19/2016   Essential hypertension 08/14/2006   Gallstones    Gastroesophageal reflux disease 07/03/2013   Symptoms are not significant enough that patient wants pharmacologic therapy at this point    Healthcare maintenance 07/03/2013   Hyperlipidemia 07/03/2013   Hyponatremia 06/22/2020   Hypothyroidism 08/14/2006   Overweight (BMI 25.0-29.9) 07/03/2013   Preventative health care 07/03/2013   Statin intolerance 07/16/2023   UTI (urinary tract infection) 08/01/2020    Past Surgical History:  Procedure Laterality Date   CARDIAC CATHETERIZATION N/A 01/06/2016   Procedure: Left Heart Cath and Coronary Angiography;  Surgeon: Jerel Balding, MD;  Location: [facility];  Service: Cardiovascular;  Laterality: N/A;   CARDIAC CATHETERIZATION N/A 01/06/2016   Procedure: Coronary Stent Intervention;  Surgeon: Debby DELENA Sor, MD;  Location: [facility];  Service: Cardiovascular;  Laterality: N/A;   CHOLECYSTECTOMY     TUBAL LIGATION      Current Medications:  Current Meds  Medication Sig   amLODipine  (NORVASC ) 2.5 MG tablet Take 1 tablet (2.5 mg total) by mouth daily.   aspirin  81 MG tablet Take 1 tablet (81 mg total) by mouth daily.   Evolocumab  (REPATHA  SURECLICK) 140 MG/ML SOAJ Inject 140 mg into the skin every 14 (fourteen) days.   ibuprofen (ADVIL) 200 MG tablet Take 200-400 mg by mouth every 6 (six) hours as  needed for mild pain (or discomfort).   isosorbide  mononitrate (IMDUR ) 60 MG 24 hr tablet Take 1 tablet (60 mg total) by mouth daily.   levothyroxine  (SYNTHROID ) 88 MCG tablet Take 1 tablet (88 mcg total) by mouth daily before breakfast.   nebivolol  (BYSTOLIC ) 10 MG tablet TAKE 1 TABLET(10 MG) BY MOUTH DAILY   nitroGLYCERIN  (NITROSTAT ) 0.4 MG SL tablet Place 1 tablet (0.4 mg total) under the tongue every 5 (five) minutes as needed for chest pain.     Allergies:   Atorvastatin  and Rosuvastatin    Social History   Socioeconomic History   Marital status: Divorced    Spouse name: Not on file   Number of children: 3   Years of education: Not on file   Highest education level: Not on file  Occupational History   Occupation: Retired   Tobacco Use   Smoking status: Never   Smokeless tobacco: Never  Vaping Use   Vaping status: Never Used  Substance and Sexual Activity   Alcohol use: Yes    Comment: Rarely.   Drug use: No   Sexual activity: Never  Other Topics Concern   Not on file  Social History Narrative   Current Social History 10/04/2020        Patient lives with family (younger son, 2 granddaughters, and grandson) in a suite over the garage which is 2 story. There are steps up to the entrance the patient uses the steps are a lift chair.       Patient's method of transportation is via family member.      The highest level of education was some college.      The patient currently retired.      Identified important Relationships are Sons, family       Pets : a dog she takes on walks       Interests / Fun: gardening       Current Stressors: transportation issues       Religious / Personal Beliefs: Christian       Other:     Social Drivers of Corporate Investment Banker Strain: Low Risk  (04/17/2023)   Overall Financial Resource Strain (CARDIA)    Difficulty of Paying Living Expenses: Not hard at all  Food Insecurity: No Food Insecurity (04/17/2023)   Hunger Vital Sign     Worried About Running Out of Food in the Last Year: Never true    Ran Out of Food in the Last Year: Never true  Transportation Needs: No Transportation Needs (04/17/2023)   PRAPARE - Administrator, Civil Service (Medical): No    Lack of Transportation (Non-Medical): No  Physical Activity: Insufficiently Active (04/17/2023)   Exercise Vital Sign    Days of Exercise per Week: 7 days    Minutes of Exercise per Session: 20 min  Stress: No Stress Concern Present (04/17/2023)   [employer] of Occupational Health - Occupational Stress Questionnaire    Feeling of Stress : Not at all  Social Connections: Socially Isolated (04/17/2023)   Social Connection and Isolation  Panel    Frequency of Communication with Friends and Family: Never    Frequency of Social Gatherings with Friends and Family: More than three times a week    Attends Religious Services: Never    Database Administrator or Organizations: No    Attends Engineer, Structural: Never    Marital Status: Divorced     Family History: The patient's family history includes Bladder Cancer in her maternal aunt and son; Breast cancer in her sister; Diabetes in her son; Healthy in her brother, sister, sister, and son; Heart disease in her mother; Lung cancer in her sister; Unexplained death in her father. There is no history of Colon cancer or Esophageal cancer.  ROS:   Please see the history of present illness.    All other systems reviewed and are negative.  EKGs/Labs/Other Studies Reviewed:    The following studies were reviewed today: .SABRAEKG Interpretation Date/Time:  Wednesday July 22 2024 07:51:00 EST Ventricular Rate:  77 PR Interval:  166 QRS Duration:  86 QT Interval:  382 QTC Calculation: 432 R Axis:   -45  Text Interpretation: Normal sinus rhythm Left anterior fascicular block Septal infarct (cited on or before 28-Jun-2020) When compared with ECG of 16-Jul-2023 09:21, No significant change was  found Confirmed by Edwyna Backers [phone]) on 07/22/2024 8:18:40 AM      Recent Labs: 07/20/2024: ALT 16; BUN 10; Creatinine, Ser 0.69; Hemoglobin 14.4; Platelets 362.0; Potassium 4.7; Sodium 142; TSH 1.17  Recent Lipid Panel    Component Value Date/Time   CHOL 93 07/20/2024 0941   CHOL 204 (H) 03/23/2022 1032   TRIG 105.0 07/20/2024 0941   HDL 41.60 07/20/2024 0941   HDL 35 (L) 03/23/2022 1032   CHOLHDL 2 07/20/2024 0941   VLDL 21.0 07/20/2024 0941   LDLCALC 31 07/20/2024 0941   LDLCALC 140 (H) 03/23/2022 1032    Physical Exam:    VS:  BP 130/78   Pulse 77   Resp (!) 97   Ht 5' (1.524 m)   Wt 138 lb (62.6 kg)   LMP 10/20/1991   BMI 26.95 kg/m     Wt Readings from Last 3 Encounters:  07/22/24 138 lb (62.6 kg)  07/20/24 137 lb (62.1 kg)  08/01/23 137 lb (62.1 kg)     GEN: Patient is in no acute distress HEENT: Normal NECK: No JVD; No carotid bruits LYMPHATICS: No lymphadenopathy CARDIAC: Hear sounds regular, 2/6 systolic murmur at the apex. RESPIRATORY:  Clear to auscultation without rales, wheezing or rhonchi  ABDOMEN: Soft, non-tender, non-distended MUSCULOSKELETAL:  No edema; No deformity  SKIN: Warm and dry NEUROLOGIC:  Alert and oriented x 3 PSYCHIATRIC:  Normal affect   Signed, Backers JONELLE Edwyna, MD  07/22/2024 8:21 AM    [facility]

## 2024-07-22 NOTE — Patient Instructions (Signed)
 Medication Instructions:  Your physician recommends that you continue on your current medications as directed. Please refer to the Current Medication list given to you today.  *If you need a refill on your cardiac medications before your next appointment, please call your pharmacy*   Lab Work: None ordered If you have labs (blood work) drawn today and your tests are completely normal, you will receive your results only by: MyChart Message (if you have MyChart) OR A paper copy in the mail If you have any lab test that is abnormal or we need to change your treatment, we will call you to review the results.  Testing/Procedures: Your physician has requested that you have an echocardiogram. Echocardiography is a painless test that uses sound waves to create images of your heart. It provides your doctor with information about the size and shape of your heart and how well your heart's chambers and valves are working. This procedure takes approximately one hour. There are no restrictions for this procedure. Please do NOT wear cologne, perfume, aftershave, or lotions (deodorant is allowed). Please arrive 15 minutes prior to your appointment time.  Please note: We ask at that you not bring children with you during ultrasound (echo/ vascular) testing. Due to room size and safety concerns, children are not allowed in the ultrasound rooms during exams. Our front office staff cannot provide observation of children in our lobby area while testing is being conducted. An adult accompanying a patient to their appointment will only be allowed in the ultrasound room at the discretion of the ultrasound technician under special circumstances. We apologize for any inconvenience.  Follow-Up: At [facility], you and your health needs are our priority.  As part of our continuing mission to provide you with exceptional heart care, we have created designated Provider Care Teams.  These Care Teams include your primary  Cardiologist (physician) and Advanced Practice Providers (APPs -  Physician Assistants and Nurse Practitioners) who all work together to provide you with the care you need, when you need it.  We recommend signing up for the patient portal called MyChart.  Sign up information is provided on this After Visit Summary.  MyChart is used to connect with patients for Virtual Visits (Telemedicine).  Patients are able to view lab/test results, encounter notes, upcoming appointments, etc.  Non-urgent messages can be sent to your provider as well.   To learn more about what you can do with MyChart, go to [email or web address].    Your next appointment:   9 month(s)  The format for your next appointment:   In Person  Provider:   Jennifer Crape, MD   Other Instructions Echocardiogram An echocardiogram is a test that uses sound waves (ultrasound) to produce images of the heart. Images from an echocardiogram can provide important information about: Heart size and shape. The size and thickness and movement of your heart's walls. Heart muscle function and strength. Heart valve function or if you have stenosis. Stenosis is when the heart valves are too narrow. If blood is flowing backward through the heart valves (regurgitation). A tumor or infectious growth around the heart valves. Areas of heart muscle that are not working well because of poor blood flow or injury from a heart attack. Aneurysm detection. An aneurysm is a weak or damaged part of an artery wall. The wall bulges out from the normal force of blood pumping through the body. Tell a health care provider about: Any allergies you have. All medicines you are taking, including vitamins, herbs,  eye drops, creams, and over-the-counter medicines. Any blood disorders you have. Any surgeries you have had. Any medical conditions you have. Whether you are pregnant or may be pregnant. What are the risks? Generally, this is a safe test. However,  problems may occur, including an allergic reaction to dye (contrast) that may be used during the test. What happens before the test? No specific preparation is needed. You may eat and drink normally. What happens during the test? You will take off your clothes from the waist up and put on a hospital gown. Electrodes or electrocardiogram (ECG)patches may be placed on your chest. The electrodes or patches are then connected to a device that monitors your heart rate and rhythm. You will lie down on a table for an ultrasound exam. A gel will be applied to your chest to help sound waves pass through your skin. A handheld device, called a transducer, will be pressed against your chest and moved over your heart. The transducer produces sound waves that travel to your heart and bounce back (or echo back) to the transducer. These sound waves will be captured in real-time and changed into images of your heart that can be viewed on a video monitor. The images will be recorded on a computer and reviewed by your health care provider. You may be asked to change positions or hold your breath for a short time. This makes it easier to get different views or better views of your heart. In some cases, you may receive contrast through an IV in one of your veins. This can improve the quality of the pictures from your heart. The procedure may vary among health care providers and hospitals.   What can I expect after the test? You may return to your normal, everyday life, including diet, activities, and medicines, unless your health care provider tells you not to do that. Follow these instructions at home: It is up to you to get the results of your test. Ask your health care provider, or the department that is doing the test, when your results will be ready. Keep all follow-up visits. This is important. Summary An echocardiogram is a test that uses sound waves (ultrasound) to produce images of the heart. Images from an  echocardiogram can provide important information about the size and shape of your heart, heart muscle function, heart valve function, and other possible heart problems. You do not need to do anything to prepare before this test. You may eat and drink normally. After the echocardiogram is completed, you may return to your normal, everyday life, unless your health care provider tells you not to do that. This information is not intended to replace advice given to you by your health care provider. Make sure you discuss any questions you have with your health care provider. Document Revised: 04/05/2020 Document Reviewed: 04/05/2020 Elsevier Patient Education  2021 [employer]   Important Information About Sugar

## 2024-07-27 ENCOUNTER — Other Ambulatory Visit (HOSPITAL_COMMUNITY): Payer: Self-pay

## 2024-07-27 ENCOUNTER — Telehealth: Payer: Self-pay | Admitting: Cardiology

## 2024-07-27 NOTE — Telephone Encounter (Signed)
 Please update PA.  Recent labs in Epic

## 2024-07-27 NOTE — Telephone Encounter (Signed)
 Pharmacy Patient Advocate Encounter   Received notification from Physician's Office that prior authorization for REPATHA  is required/requested.   Insurance verification completed.   The patient is insured through [facility] MEDICAID.   Per test claim: PA required; PA submitted to above mentioned insurance via Latent Key/confirmation #[id number] [facility] Status is pending

## 2024-07-27 NOTE — Telephone Encounter (Signed)
  Pt c/o medication issue:  1. Name of Medication:   Evolocumab  (REPATHA  SURECLICK) 140 MG/ML SOAJ    2. How are you currently taking this medication (dosage and times per day)? Inject 140 mg into the skin every 14 (fourteen) days.   3. Are you having a reaction (difficulty breathing--STAT)? No   4. What is your medication issue? Pt states the pharmacy told her insurance would not cover her Repatha  and to contact the office. Pt is due to have a shot today. Please advise.

## 2024-07-27 NOTE — Telephone Encounter (Signed)
 Prior authorization expired in October.  Labs recently drawn.  Patient aware to wait 2-3 days and check with pharmacy.  Request for update sent to PA team.

## 2024-07-30 ENCOUNTER — Other Ambulatory Visit (HOSPITAL_COMMUNITY): Payer: Self-pay

## 2024-07-30 NOTE — Telephone Encounter (Signed)
 Pharmacy called he contacted insurance company and they have not received PA request. He states pt is now using [employer] Seth JONETTA Ally: [id number]   PCN: [id number]  Group number: [id number]  Member ID: [id number]   Rx number: [id number]  Store number: [id number]

## 2024-07-30 NOTE — Telephone Encounter (Signed)
 Patient has two active insurances. PA has been submitted to [facility] as requested.

## 2024-08-04 ENCOUNTER — Other Ambulatory Visit (HOSPITAL_COMMUNITY): Payer: Self-pay

## 2024-08-04 NOTE — Telephone Encounter (Signed)
 Pharmacy Patient Advocate Encounter  Received notification from [facility] that Prior Authorization for REPATHA  has been APPROVED from 07/31/24 to 08/26/25

## 2024-08-05 ENCOUNTER — Ambulatory Visit: Payer: Self-pay | Admitting: Family Medicine

## 2024-08-13 ENCOUNTER — Ambulatory Visit: Payer: Self-pay | Admitting: Cardiology

## 2024-08-13 ENCOUNTER — Ambulatory Visit (HOSPITAL_BASED_OUTPATIENT_CLINIC_OR_DEPARTMENT_OTHER): Admission: RE | Admit: 2024-08-13 | Discharge: 2024-08-13 | Attending: Cardiology | Admitting: Cardiology

## 2024-08-13 DIAGNOSIS — R011 Cardiac murmur, unspecified: Secondary | ICD-10-CM | POA: Diagnosis not present

## 2024-08-13 DIAGNOSIS — I4729 Other ventricular tachycardia: Secondary | ICD-10-CM

## 2024-08-13 LAB — ECHOCARDIOGRAM COMPLETE
AR max vel: 1.11 cm2
AV Area VTI: 1.35 cm2
AV Area mean vel: 1.08 cm2
AV Mean grad: 6 mmHg
AV Peak grad: 11.8 mmHg
Ao pk vel: 1.72 m/s
Area-P 1/2: 3.42 cm2
Calc EF: 66.8 %
S' Lateral: 2.8 cm
Single Plane A2C EF: 65.3 %
Single Plane A4C EF: 68.9 %

## 2024-08-24 DIAGNOSIS — R002 Palpitations: Secondary | ICD-10-CM | POA: Diagnosis not present

## 2024-08-25 ENCOUNTER — Encounter (HOSPITAL_COMMUNITY): Payer: Self-pay | Admitting: Cardiology

## 2024-11-05 ENCOUNTER — Encounter (HOSPITAL_COMMUNITY)
